# Patient Record
Sex: Female | Born: 1945
Health system: Southern US, Community
[De-identification: ages and names within clinical notes are randomized; demographics above are authoritative.]

## PROBLEM LIST (undated history)

## (undated) DIAGNOSIS — E785 Hyperlipidemia, unspecified: Secondary | ICD-10-CM

## (undated) DIAGNOSIS — Z720 Tobacco use: Secondary | ICD-10-CM

## (undated) DIAGNOSIS — I10 Essential (primary) hypertension: Secondary | ICD-10-CM

## (undated) DIAGNOSIS — T50B95A Adverse effect of other viral vaccines, initial encounter: Secondary | ICD-10-CM

## (undated) DIAGNOSIS — Z972 Presence of dental prosthetic device (complete) (partial): Secondary | ICD-10-CM

## (undated) DIAGNOSIS — K08109 Complete loss of teeth, unspecified cause, unspecified class: Secondary | ICD-10-CM

## (undated) DIAGNOSIS — M79604 Pain in right leg: Secondary | ICD-10-CM

## (undated) DIAGNOSIS — Z9289 Personal history of other medical treatment: Secondary | ICD-10-CM

## (undated) DIAGNOSIS — M79605 Pain in left leg: Secondary | ICD-10-CM

## (undated) DIAGNOSIS — R7303 Prediabetes: Secondary | ICD-10-CM

## (undated) DIAGNOSIS — Z973 Presence of spectacles and contact lenses: Secondary | ICD-10-CM

## (undated) DIAGNOSIS — R319 Hematuria, unspecified: Secondary | ICD-10-CM

## (undated) DIAGNOSIS — R7301 Impaired fasting glucose: Secondary | ICD-10-CM

## (undated) HISTORY — DX: Impaired fasting glucose: R73.01

## (undated) HISTORY — DX: Hematuria, unspecified: R31.9

## (undated) HISTORY — DX: Hyperlipidemia, unspecified: E78.5

## (undated) HISTORY — DX: Pain in right leg: M79.604

## (undated) HISTORY — DX: Presence of dental prosthetic device (complete) (partial): Z97.2

## (undated) HISTORY — DX: Presence of spectacles and contact lenses: Z97.3

## (undated) HISTORY — PX: TONSILLECTOMY: SUR1361

## (undated) HISTORY — DX: Tobacco use: Z72.0

## (undated) HISTORY — DX: Complete loss of teeth, unspecified cause, unspecified class: K08.109

## (undated) HISTORY — DX: Pain in left leg: M79.605

## (undated) HISTORY — DX: Essential (primary) hypertension: I10

## (undated) HISTORY — PX: SPINE SURGERY: SHX786

## (undated) HISTORY — DX: Adverse effect of other viral vaccines, initial encounter: T50.B95A

## (undated) HISTORY — DX: Personal history of other medical treatment: Z92.89

## (undated) HISTORY — PX: SHOULDER SURGERY: SHX246

## (undated) HISTORY — PX: KNEE SURGERY: SHX244

## (undated) HISTORY — PX: TUBAL LIGATION: SHX77

## (undated) HISTORY — DX: Presence of dental prosthetic device (complete) (partial): K08.109

---

## 1978-09-09 HISTORY — PX: ABDOMINAL HYSTERECTOMY: SHX81

## 1978-09-09 HISTORY — PX: APPENDECTOMY: SHX54

## 1998-03-20 ENCOUNTER — Emergency Department (HOSPITAL_COMMUNITY): Admission: EM | Admit: 1998-03-20 | Discharge: 1998-03-20 | Payer: Self-pay | Admitting: Emergency Medicine

## 1999-11-27 ENCOUNTER — Ambulatory Visit (HOSPITAL_COMMUNITY): Admission: RE | Admit: 1999-11-27 | Discharge: 1999-11-27 | Payer: Self-pay | Admitting: Orthopaedic Surgery

## 2000-01-02 ENCOUNTER — Ambulatory Visit (HOSPITAL_COMMUNITY): Admission: RE | Admit: 2000-01-02 | Discharge: 2000-01-02 | Payer: Self-pay | Admitting: Orthopaedic Surgery

## 2000-02-07 ENCOUNTER — Ambulatory Visit (HOSPITAL_COMMUNITY): Admission: RE | Admit: 2000-02-07 | Discharge: 2000-02-07 | Payer: Self-pay | Admitting: Orthopaedic Surgery

## 2000-06-05 ENCOUNTER — Encounter: Admission: RE | Admit: 2000-06-05 | Discharge: 2000-06-05 | Payer: Self-pay | Admitting: Family Medicine

## 2000-06-05 ENCOUNTER — Encounter: Payer: Self-pay | Admitting: Family Medicine

## 2001-06-08 ENCOUNTER — Encounter: Admission: RE | Admit: 2001-06-08 | Discharge: 2001-06-08 | Payer: Self-pay | Admitting: Family Medicine

## 2001-06-08 ENCOUNTER — Encounter: Payer: Self-pay | Admitting: Family Medicine

## 2002-06-21 ENCOUNTER — Encounter: Payer: Self-pay | Admitting: Family Medicine

## 2002-06-21 ENCOUNTER — Encounter: Admission: RE | Admit: 2002-06-21 | Discharge: 2002-06-21 | Payer: Self-pay | Admitting: Family Medicine

## 2003-06-23 ENCOUNTER — Encounter: Payer: Self-pay | Admitting: Family Medicine

## 2003-06-23 ENCOUNTER — Encounter: Admission: RE | Admit: 2003-06-23 | Discharge: 2003-06-23 | Payer: Self-pay | Admitting: Family Medicine

## 2003-10-03 ENCOUNTER — Inpatient Hospital Stay (HOSPITAL_COMMUNITY): Admission: EM | Admit: 2003-10-03 | Discharge: 2003-10-05 | Payer: Self-pay | Admitting: Emergency Medicine

## 2003-10-04 ENCOUNTER — Encounter (INDEPENDENT_AMBULATORY_CARE_PROVIDER_SITE_OTHER): Payer: Self-pay | Admitting: Cardiology

## 2004-06-26 ENCOUNTER — Ambulatory Visit (HOSPITAL_COMMUNITY): Admission: RE | Admit: 2004-06-26 | Discharge: 2004-06-26 | Payer: Self-pay | Admitting: Family Medicine

## 2005-07-12 ENCOUNTER — Encounter: Admission: RE | Admit: 2005-07-12 | Discharge: 2005-07-12 | Payer: Self-pay | Admitting: Family Medicine

## 2006-07-07 ENCOUNTER — Ambulatory Visit: Payer: Self-pay | Admitting: Family Medicine

## 2006-07-24 ENCOUNTER — Encounter: Admission: RE | Admit: 2006-07-24 | Discharge: 2006-07-24 | Payer: Self-pay | Admitting: Family Medicine

## 2006-07-24 LAB — HM MAMMOGRAPHY: HM Mammogram: NEGATIVE

## 2006-09-09 DIAGNOSIS — R7301 Impaired fasting glucose: Secondary | ICD-10-CM

## 2006-09-09 HISTORY — DX: Impaired fasting glucose: R73.01

## 2006-11-11 ENCOUNTER — Ambulatory Visit: Payer: Self-pay | Admitting: Family Medicine

## 2006-12-22 ENCOUNTER — Ambulatory Visit: Payer: Self-pay | Admitting: Family Medicine

## 2006-12-23 ENCOUNTER — Ambulatory Visit (HOSPITAL_COMMUNITY): Admission: RE | Admit: 2006-12-23 | Discharge: 2006-12-23 | Payer: Self-pay | Admitting: Family Medicine

## 2006-12-30 ENCOUNTER — Ambulatory Visit: Payer: Self-pay | Admitting: Family Medicine

## 2006-12-30 ENCOUNTER — Ambulatory Visit (HOSPITAL_COMMUNITY): Admission: RE | Admit: 2006-12-30 | Discharge: 2006-12-30 | Payer: Self-pay | Admitting: Family Medicine

## 2007-01-07 ENCOUNTER — Ambulatory Visit (HOSPITAL_COMMUNITY): Admission: RE | Admit: 2007-01-07 | Discharge: 2007-01-07 | Payer: Self-pay | Admitting: Family Medicine

## 2007-07-07 ENCOUNTER — Ambulatory Visit: Payer: Self-pay | Admitting: Family Medicine

## 2007-07-27 ENCOUNTER — Encounter: Admission: RE | Admit: 2007-07-27 | Discharge: 2007-07-27 | Payer: Self-pay | Admitting: Family Medicine

## 2007-10-26 ENCOUNTER — Ambulatory Visit: Payer: Self-pay | Admitting: Family Medicine

## 2008-01-11 ENCOUNTER — Ambulatory Visit: Payer: Self-pay | Admitting: Family Medicine

## 2008-04-06 ENCOUNTER — Encounter: Admission: RE | Admit: 2008-04-06 | Discharge: 2008-04-06 | Payer: Self-pay | Admitting: Family Medicine

## 2008-04-06 ENCOUNTER — Ambulatory Visit: Payer: Self-pay | Admitting: Family Medicine

## 2008-09-09 DIAGNOSIS — Z9289 Personal history of other medical treatment: Secondary | ICD-10-CM

## 2008-09-09 HISTORY — DX: Personal history of other medical treatment: Z92.89

## 2011-01-25 NOTE — Discharge Summary (Signed)
NAME:  Alyssa Carter, Alyssa Carter                       ACCOUNT NO.:  [id number]   MEDICAL RECORD NO.:  [id number]                   PATIENT TYPE:  INP   LOCATION:  3001                                 FACILITY:  [facility]   PHYSICIAN:  Hettie Holstein, D.O.                 DATE OF BIRTH:  1946/07/20   DATE OF ADMISSION:  10/03/2003  DATE OF DISCHARGE:  10/05/2003                                 DISCHARGE SUMMARY   PHYSICIANS:  1. Sharlot Gowda, M.D., primary care physician  2. Pramod P. Pearlean Brownie, M.D., neurologist.   ADMISSION DIAGNOSIS:  Transient ischemic attack.   DISCHARGE DIAGNOSES:  1. Subacute cerebrovascular accident.  2. Status post consultation by throat service here at the hospital with the     patient suspected of having cerebrovascular accident secondary to     microvascular disease and hyperlipidemia.  She was noted to have a right     external capsular infarct with normal carotid dopplers as well as normal     echocardiogram revealing an EF of 55-65%.  She underwent ABIs that did     reveal left ABI indicative of moderate to severe reduction of flow,     though this was an improvement since January of 2005.  The right was     within normal limits.  She underwent MRI that did reveal acute right     hemispheric infarction involving the external capsule on the right with a     second tiny punctate focus with acute infarction in the centrum     semiovale, paraventricular region.  MRA was performed without evidence of     significant carotid extracranial atherosclerotic disease; however, there     was 75% to 90% stenosis at the mid to distal left vertebral artery.   HISTORY OF PRESENT ILLNESS:  Please see the H&P.  Briefly, this is a 65-year-  old Caucasian right-handed female with a history of chronic back pain who  repeated three occasions of left arm incoordination and numbness.  She had  difficulty picking up the phone and silverware.  She has also complained of  some left leg  weakness as well.   She saw her primary care physician on Monday morning and subsequently  presented to the ED by that time her symptoms had completely resolved.   HOSPITAL COURSE:  She underwent MRI evaluation with findings as noted above.  She was evaluated by the neurology/stroke service here and evaluation  concluded with acute CVA secondary to microvascular disease with  recommendations for Lipitor.  Her homocystine levels were within normal  limits.  She was encouraged to enroll in the smoking cessation.  She is  scheduled for follow up with Dr. Sharlot Gowda as well as Dr. Sunny Schlein. Sethi  of neurology in two months.   DISCHARGE MEDICATIONS:  1. Aspirin 325 mg p.o. q.d.  2. Lipitor 4 mg p.o. q.d.  3. She is instructed  to continue her multivitamin and calcium.                                                Hettie Holstein, D.O.    ESS/MEDQ  D:  11/07/2003  T:  11/08/2003  Job:  [id number]

## 2011-01-25 NOTE — Consult Note (Signed)
NAME:  Alyssa Carter, Alyssa Carter                       ACCOUNT NO.:  [id number]   MEDICAL RECORD NO.:  [id number]                   PATIENT TYPE:  INP   LOCATION:  3001                                 FACILITY:  [facility]   PHYSICIAN:  Melvyn Novas, M.D.               DATE OF BIRTH:  19-Dec-1945   DATE OF CONSULTATION:  DATE OF DISCHARGE:                                   CONSULTATION   Alyssa Carter is today admitted to the hospitalist service under Dr. Foye Clock name on October 03, 2003, from the [facility] ER.  She is a 65-year-  old Caucasian right-handed female with a medical record of [id number].  Date of  birth is Jul 17, 1946.  The patient is a appropriate, pleasant 65-year-  old right-handed female who had 4 stereotypical spells that occurred at  various times on Saturday and Sunday.  She stated that on Saturday on 3  occasions her left arm became sluggish, numb, and did not seem to be able to  perform fine motor skills.  She was subsequently not able to pick up her  telephone receiver or silverware.  In addition, she states that her left leg  gave out on her and said she had to sit in the recliner and wait until this  spell passed.  This occurred Saturday, again 3 times.  On Sunday was the 4th  spell equivalent with left arm and leg weakness and clumsiness.  She  presented on Monday morning to Dr. Susann Givens, her primary care physician, who  then sent her to the ER.  The patient had no symptoms here and none since  yesterday's spell.   MEDICAL HISTORY:  None except for chronic back pain.   MEDICINES:  She takes Tums, multivitamins.  She is not on aspirin or any  prescription drugs.  She has no known drug allergies.   SOCIAL HISTORY:  She is a smoker, 1-1/2 packs per day for over 30 years.  No  alcohol.  She says she lives an active life, is gainfully employed.   FAMILY HISTORY:  Positive for hypertension.   PHYSICAL EXAMINATION:  VITAL SIGNS:  Blood pressure 145/68, heart rate  68  and regular, temperature 97.8, respiratory rate 16 and regular.  LUNGS:  Clear to auscultation.  HEENT, NECK:  No neck vein distention.  No goiter.  No edema.  No tongue  bite.  MENTAL STATUS:  Alert and oriented.  No arthralgia, apraxia, or dysarthria.  Cranial nerves:  Pupils are equal to light and accommodation.  Full  extraocular movements without nystagmus or diplopia.  Full visual fields  bilaterally with bilateral simultaneous stimulation.  No papilledema.  Facial symmetry.  Tongue and uvula are midline.  Motor exam with 5/5  strength.  Tone and _________ bilaterally equal.  Grip strength finger-to-  nose intact.  Vision intact.  Normal gait, stance, with negative Romberg and  normal arm strength.  No  evidence of imbalance.   CT is pending.   The patient is admitted to Dr. Elliot Cousin, hospitalist service.  We will  follow on the stroke team for possible _____________ TIA, stroke workup.  The patient denies having a history of atrial fibrillation or any known  cardiac disease or any other risk factors for atherosclerosis, cardiac valve  disease, and denies drug abuse.                                              Melvyn Novas, M.D.   CD/MEDQ  D:  10/03/2003  T:  10/04/2003  Job:  [id number]

## 2011-01-25 NOTE — H&P (Signed)
NAME:  Alyssa Carter, Alyssa Carter                       ACCOUNT NO.:  [id number]   MEDICAL RECORD NO.:  [id number]                   PATIENT TYPE:  INP   LOCATION:  1825                                 FACILITY:  [facility]   PHYSICIAN:  Vania Rea, M.D.              DATE OF BIRTH:  1946-04-21   DATE OF ADMISSION:  10/03/2003  DATE OF DISCHARGE:                                HISTORY & PHYSICAL   PRIMARY CARE PHYSICIAN:  Sharlot Gowda, M.D.   CHIEF COMPLAINT:  Recurrent left sided weakness for two days.   HISTORY OF PRESENT ILLNESS:  This is a 65 year old Caucasian female with a  history of hyperlipidemia, noncompliance with her Lipitor for the past one  year who noticed weakness and numbness of her left upper extremity two days  ago when she was sitting and attempted to reach to pick up something.  She  then tried to stand and noticed weakness and numbness of her left lower  extremity.  The problem resolved within one minute, but then recurred twice  more at 15 minute intervals, each time lasting for one minute.  She was well  until the following day when she had another recurrence lasting one minute.  She went to her primary care physician, Dr. Susann Givens this morning and he sent  her in for admission.  The patient denies headaches, nausea, vomiting.  ____________.  She is a fitness person.  She walks two miles everyday  without any difficulty.  She has no fevers, cough or cold.  She denies  headaches; only started in the emergency room when she starts to get anxious  she develops a band-like headache around her head.   PAST MEDICAL HISTORY:  Hyperlipidemia.  No diabetes.  No hypertension.   MEDICAL MEDICATIONS:  Lipitor __________ x1 year.   SOCIAL HISTORY:  No tobacco, alcohol or drugs.  She was complete with her  divorce one week ago.  She lost her father to a stroke status post a fall  six months ago.  Her father was 30 years old.  He also had a hint of senile  dementia.  She has two  siblings, her brother and sister.  The sister has  hyperlipidemia, otherwise health.  She has two children, son and daughter,  and they are both healthy.   REVIEW OF SYSTEMS:  Review of systems is completely negative.   PHYSICAL EXAMINATION:  GENERAL:  This is an anxious-looking, middle-aged,  Caucasian female lying on the stretcher in no respiratory or painful  distress.  VITAL SIGNS:  When her first seen her vitals were a temperature of 98.3,  pulse 100, respirations 22, blood pressure 160/92, saturating 99% on room  air.  Later on she settled down with a temperature of  97.8, blood pressure  142/74, pulse 82, respirations 18, saturating at 98%.  HEENT:  She is pink and anicteric.  No lymphadenopathy.  No dehydration.  There is no jugular  venous distention.  There is no evidence of trauma.  CHEST:  Clear to auscultation bilaterally.  CARDIOVASCULAR:  Regular rhythm.  No murmur.  ABDOMEN:  Obese, soft, nontender.  EXTREMITIES:  No edema.  Pulses are 2+.  CNS:  Her cranial nerves are intact.  Her reflexes are equal and normal  throughout.  Sensation is normal throughout.  The power is grade 5  throughout.   Her CT scan of the head shows 3 to 6 mm internal capsule hyperdense probably  ischemic infarct.  Her labs including CBC, chem-7, LFTs and urinalysis are  completely unremarkable.  No abnormalities observed.  Chest x-ray and EKG  are pending.   ASSESSMENT:  Lady presenting with symptoms of TIA and a CT finding of what  appears to be a lacunar infarct.   PLAN:  1. I have discussed with neurology.  2. Admit.  Start on aspirin.  Neurological checks.  3. Further management as per the neurology service.   Discussed with neurology, they will be coming to see the patient tonight.                                                Vania Rea, M.D.    LC/MEDQ  D:  10/03/2003  T:  10/04/2003  Job:  [id number]

## 2011-12-03 ENCOUNTER — Ambulatory Visit (INDEPENDENT_AMBULATORY_CARE_PROVIDER_SITE_OTHER): Payer: Self-pay | Admitting: Medical

## 2011-12-03 ENCOUNTER — Encounter: Payer: Self-pay | Admitting: Medical

## 2011-12-03 DIAGNOSIS — H68009 Unspecified Eustachian salpingitis, unspecified ear: Secondary | ICD-10-CM

## 2011-12-03 DIAGNOSIS — R030 Elevated blood-pressure reading, without diagnosis of hypertension: Secondary | ICD-10-CM

## 2011-12-03 NOTE — Patient Instructions (Addendum)
Currently you do NOT have an ear infection, and there is no wax in the ear canal.  It would appear that you have fluid behind the ear drums and in the eustachian tubes.     I recommend you begin OTC Zyrtec nightly and plain Mucinex twice daily for the next 5-7 days.     If worse pain, fever, ear ache, etc, call back and we will call out an antibiotic.    Barotitis Media Barotitis media is soreness (inflammation) of the area behind the eardrum (middle ear). This occurs when the auditory tube (Eustachian tube) leading from the back of the throat to the eardrum is blocked. When it is blocked air cannot move in and out of the middle ear to equalize pressure changes. These pressure changes come from changes in altitude when:  Flying.   Driving in the mountains.   Diving.  Problems are more likely to occur with pressure changes during times when you are congested as from:  Hay fever.   Upper respiratory infection.   A cold.  Damage or hearing loss (barotrauma) caused by this may be permanent. HOME CARE INSTRUCTIONS   Use medicines as recommended by your caregiver. Over the counter medicines will help unblock the canal and can help during times of air travel.   Do not put anything into your ears to clean or unplug them. Eardrops will not be helpful.   Do not swim, dive, or fly until your caregiver says it is all right to do so. If these activities are necessary, chewing gum with frequent swallowing may help. It is also helpful to hold your nose and gently blow to pop your ears for equalizing pressure changes. This forces air into the Eustachian tube.   For little ones with problems, give your baby a bottle of water or juice during periods when pressure changes would be anticipated such as during take offs and landings associated with air travel.   Only take over-the-counter or prescription medicines for pain, discomfort, or fever as directed by your caregiver.   A decongestant may be  helpful in de-congesting the middle ear and make pressure equalization easier. This can be even more effective if the drops (spray) are delivered with the head lying over the edge of a bed with the head tilted toward the ear on the affected side.   If your caregiver has given you a follow-up appointment, it is very important to keep that appointment. Not keeping the appointment could result in a chronic or permanent injury, pain, hearing loss and disability. If there is any problem keeping the appointment, you must call back to this facility for assistance.  SEEK IMMEDIATE MEDICAL CARE IF:   You develop a severe headache, dizziness, severe ear pain, or bloody or pus-like drainage from your ears.   An oral temperature above 102 F (38.9 C) develops.   Your problems do not improve or become worse.  MAKE SURE YOU:   Understand these instructions.   Will watch your condition.   Will get help right away if you are not doing well or get worse.  Document Released: 08/23/2000 Document Revised: 05/08/2011 Document Reviewed: 03/31/2008 [facility] Patient Information 2012 [place], [place].

## 2011-12-03 NOTE — Progress Notes (Signed)
Subjective:  Alyssa Carter is a 66 y.o. female who presents with right ear for a few weeks.  Last visit here 2009.   Gets occasional pain.  Now feels like she is talking in a barrel with crickets.  Denies fever, sinus pressure, doesn't feel sick.  Hearing feels off on the right.  No headaches.  No numbness or tingling, no speech changes.    Using some ear drops for wax and this made things worse.  She is a smoker.  No other aggravating or relieving factors.  No other c/o.  ROS Gen: no fever, chills HEENT: no ST, runny nose, sneezing GI: negative Lungs: no SOB, wheezing   Objective:   Filed Vitals:   12/03/11 1349  BP: 150/90  Pulse: 72  Temp: 98.2 F (36.8 C)  Resp: 16    General appearance: Alert, WD/WN, no distress                             Skin: warm, no rash                           Head: no sinus tenderness                            Eyes: conjunctiva normal, corneas clear, PERRLA                            Ears: left TM with mild erythema, flat, right TM with serous fluid behind TM, no erythema, external ear canals normal                          Nose: septum midline, turbinates swollen, with erythema and clear discharge             Mouth/throat: MMM, tongue normal, mild pharyngeal erythema                           Neck: supple, no adenopathy, no thyromegaly, nontender                          Heart: RRR, normal S1, S2, no murmurs                         Lungs: CTA bilaterally, no wheezes, rales, or rhonchi     Assessment and Plan:   Encounter Diagnoses  Name Primary?  . Eustachian salpingitis Yes  . Elevated blood pressure reading without diagnosis of hypertension    Discussed etiology, usual course of symptoms.  Advised she begin OTC Zyrtec and Mucinex, increase water intake.  If worse or not improving in next 3-5 days, consider Amoxicillin.    Advised she return soon for physical, recheck on elevated BP.

## 2012-09-28 ENCOUNTER — Ambulatory Visit (INDEPENDENT_AMBULATORY_CARE_PROVIDER_SITE_OTHER): Payer: Self-pay | Admitting: Medical

## 2012-09-28 ENCOUNTER — Encounter: Payer: Self-pay | Admitting: Medical

## 2012-09-28 VITALS — BP 160/80 | HR 82 | Temp 98.3°F | Resp 16 | Wt 173.0 lb

## 2012-09-28 DIAGNOSIS — S39012A Strain of muscle, fascia and tendon of lower back, initial encounter: Secondary | ICD-10-CM

## 2012-09-28 DIAGNOSIS — M62830 Muscle spasm of back: Secondary | ICD-10-CM

## 2012-09-28 DIAGNOSIS — IMO0002 Reserved for concepts with insufficient information to code with codable children: Secondary | ICD-10-CM

## 2012-09-28 DIAGNOSIS — M5380 Other specified dorsopathies, site unspecified: Secondary | ICD-10-CM

## 2012-09-28 DIAGNOSIS — I10 Essential (primary) hypertension: Secondary | ICD-10-CM

## 2012-09-28 MED ORDER — IBUPROFEN 800 MG PO TABS: 800.0000 mg | ORAL_TABLET | Freq: Three times a day (TID) | ORAL | Status: DC | PRN

## 2012-09-28 MED ORDER — CYCLOBENZAPRINE HCL 10 MG PO TABS: ORAL_TABLET | ORAL | Status: DC

## 2012-09-28 MED ORDER — LISINOPRIL 10 MG PO TABS: 10.0000 mg | ORAL_TABLET | Freq: Every day | ORAL | Status: DC

## 2012-09-28 NOTE — Progress Notes (Signed)
  Subjective:    Alyssa Carter is a 67 y.o. female who presents for evaluation of back pain. The patient has a hx/o lumbar surgery 30 years ago, but has done relatively well for years.  She notes she was at work 4 days ago, was helping move her office desk to rearrange the office area, and when she had squatted down with her legs and went to lift up, felt immediate pull and pain sensation in her back.  After sitting to rest for a while, she tried to get back to work, vacuuming and cleaning, but continued to have back pain and spasm.  Used some Ibuprofen a few times without relief.  Thus she is here today for ongoing back pain.  She did report this to her supervisor, and did get approval to be evaluated today.  The pain is in the low back, worse with standing, bending, getting up from sitting or lying position.    The following portions of the patient's history were reviewed and updated as appropriate: allergies, current medications, past family history, past medical history, past social history, past surgical history and problem list.  Review of Systems Constitutional: denies fever, chills, sweats, unexpected weight change, anorexia, fatigue Dermatology: rash, bruising, redness Cardiology: denies chest pain, palpitations, edema, orthopnea, paroxysmal nocturnal dyspnea Respiratory: denies cough, shortness of breath, dyspnea on exertion, wheezing, hemoptysis Gastroenterology: denies abdominal pain, nausea, vomiting, diarrhea, constipation, blood in stool, changes in bowel movement, dysphagia Musculoskeletal: denies arthralgias, joint swelling, neck pain, cramping Urology: denies dysuria, difficulty urinating, hematuria, urinary frequency, urgency, incontinence Neurology: no headache, weakness, tingling, numbness, speech abnormality, memory loss, falls, dizziness      Objective:    Filed Vitals:   09/28/12 1422  BP: 160/80  Pulse: 82  Temp: 98.3 F (36.8 C)  Resp: 16    General  appearance: alert, no distress, WD/WN, female Neck: supple, no lymphadenopathy, no thyromegaly, no masses, normal ROM Chest: non tender, normal shape and expansion Heart: RRR, normal S1, S2, no murmurs Lungs: CTA bilaterally, no wheezes, rhonchi, or rales Abdomen: +bs, soft, non tender, non distended, no masses, no hepatomegaly, no splenomegaly, no bruits Back: tender left lumbar paraspinal region, +spasm, pain with back extension and standing from seated position, otherwise ROM relatively normal Extremities: no edema, no cyanosis, no clubbing Pulses: 2+ symmetric, upper and lower extremities Neuro: -SLR, normal heel and toe walk, DTRs 2+, LE sensation WNL     Assessment:    Encounter Diagnoses  Name Primary?  . Back strain Yes  . Muscle spasm of back   . Essential hypertension, benign       Plan:    Back strain, muscle spasm - discussed diagnosis and treatment.   Advised rest, no heavy lifting, no lifting over 10 lbs for the next 3 days, can use heat, begin scripts for Ibuprofen, Flexeril prn, and symptoms should gradually improve over the next week.  Gave note for work.   If worse or not improving, recheck.    HTN - new diagnosis.  I reviewed prior blood pressure readings dating back from 2008.  She has had elevated readings since then.   Given these readings and today's readings, we will begin medication for new diagnosis of hypertension.  Discussed risks of untreated hypertension.  Discussed medication risks/benefits.   Begin Lisinopril 10mg  daily, f/u in 1 month for recheck, labs.

## 2012-09-28 NOTE — Patient Instructions (Signed)
Diagnosis today: Back strain, muscle spasm  Recommendations:  Rest, no heavy lifting, no lifting over 10 lbs for the next 3-5 days.  Begin Flexeril muscle relaxer, 1/2 - 1 tablet for spasm.  Use this either at bedtime or up to every 8 hours if you are at home.  This can cause drowsiness  Begin Ibuprofen 800mg  for pain/inflammation.  Use this every 8 hours for the next few days, then just use as needed.    Consider heat, massage.  If not much improved in 3- 5 days, then return  Additional information is provided below.  Please call if you have questions or concerns. Low Back Strain with Rehab A strain is an injury in which a tendon or muscle is torn. The muscles and tendons of the lower back are vulnerable to strains. However, these muscles and tendons are very strong and require a great force to be injured. Strains are classified into three categories. Grade 1 strains cause pain, but the tendon is not lengthened. Grade 2 strains include a lengthened ligament, due to the ligament being stretched or partially ruptured. With grade 2 strains there is still function, although the function may be decreased. Grade 3 strains involve a complete tear of the tendon or muscle, and function is usually impaired. SYMPTOMS   Pain in the lower back.   Pain that affects one side more than the other.   Pain that gets worse with movement and may be felt in the hip, buttocks, or back of the thigh.   Muscle spasms of the muscles in the back.   Swelling along the muscles of the back.   Loss of strength of the back muscles.   Crackling sound (crepitation) when the muscles are touched.  CAUSES  Lower back strains occur when a force is placed on the muscles or tendons that is greater than they can handle. Common causes of injury include:  Prolonged overuse of the muscle-tendon units in the lower back, usually from incorrect posture.   A single violent injury or force applied to the back.  RISK  INCREASES WITH:  Sports that involve twisting forces on the spine or a lot of bending at the waist (football, rugby, weightlifting, bowling, golf, tennis, speed skating, racquetball, swimming, running, gymnastics, diving).   Poor strength and flexibility.   Failure to warm up properly before activity.   Family history of lower back pain or disk disorders.   Previous back injury or surgery (especially fusion).   Poor posture with lifting, especially heavy objects.   Prolonged sitting, especially with poor posture.  PREVENTION   Learn and use proper posture when sitting or lifting (maintain proper posture when sitting, lift using the knees and legs, not at the waist).   Warm up and stretch properly before activity.   Allow for adequate recovery between workouts.   Maintain physical fitness:   Strength, flexibility, and endurance.   Cardiovascular fitness.  PROGNOSIS  If treated properly, lower back strains usually heal within 6 weeks. RELATED COMPLICATIONS   Recurring symptoms, resulting in a chronic problem.   Chronic inflammation, scarring, and partial muscle-tendon tear.   Delayed healing or resolution of symptoms.   Prolonged disability.  TREATMENT  Treatment first involves the use of ice and medicine, to reduce pain and inflammation. The use of strengthening and stretching exercises may help reduce pain with activity. These exercises may be performed at home or with a therapist. Severe injuries may require referral to a therapist for further evaluation and  treatment, such as ultrasound. Your caregiver may advise that you wear a back brace or corset, to help reduce pain and discomfort. Often, prolonged bed rest results in greater harm then benefit. Corticosteroid injections may be recommended. However, these should be reserved for the most serious cases. It is important to avoid using your back when lifting objects. At night, sleep on your back on a firm mattress with a  pillow placed under your knees. If non-surgical treatment is unsuccessful, surgery may be needed.  MEDICATION   If pain medicine is needed, nonsteroidal anti-inflammatory medicines (aspirin and ibuprofen), or other minor pain relievers (acetaminophen), are often advised.   Do not take pain medicine for 7 days before surgery.   Prescription pain relievers may be given, if your caregiver thinks they are needed. Use only as directed and only as much as you need.   Ointments applied to the skin may be helpful.   Corticosteroid injections may be given by your caregiver. These injections should be reserved for the most serious cases, because they may only be given a certain number of times.  HEAT AND COLD  Cold treatment (icing) should be applied for 10 to 15 minutes every 2 to 3 hours for inflammation and pain, and immediately after activity that aggravates your symptoms. Use ice packs or an ice massage.   Heat treatment may be used before performing stretching and strengthening activities prescribed by your caregiver, physical therapist, or athletic trainer. Use a heat pack or a warm water soak.  SEEK MEDICAL CARE IF:   Symptoms get worse or do not improve in 2 to 4 weeks, despite treatment.   You develop numbness, weakness, or loss of bowel or bladder function.   New, unexplained symptoms develop. (Drugs used in treatment may produce side effects.)  EXERCISES  RANGE OF MOTION (ROM) AND STRETCHING EXERCISES - Low Back Strain Most people with lower back pain will find that their symptoms get worse with excessive bending forward (flexion) or arching at the lower back (extension). The exercises which will help resolve your symptoms will focus on the opposite motion.  Your physician, physical therapist or athletic trainer will help you determine which exercises will be most helpful to resolve your lower back pain. Do not complete any exercises without first consulting with your caregiver.  Discontinue any exercises which make your symptoms worse until you speak to your caregiver.  If you have pain, numbness or tingling which travels down into your buttocks, leg or foot, the goal of the therapy is for these symptoms to move closer to your back and eventually resolve. Sometimes, these leg symptoms will get better, but your lower back pain may worsen. This is typically an indication of progress in your rehabilitation. Be very alert to any changes in your symptoms and the activities in which you participated in the 24 hours prior to the change. Sharing this information with your caregiver will allow him/her to most efficiently treat your condition.  These exercises may help you when beginning to rehabilitate your injury. Your symptoms may resolve with or without further involvement from your physician, physical therapist or athletic trainer. While completing these exercises, remember:   Restoring tissue flexibility helps normal motion to return to the joints. This allows healthier, less painful movement and activity.   An effective stretch should be held for at least 30 seconds.   A stretch should never be painful. You should only feel a gentle lengthening or release in the stretched tissue.  FLEXION RANGE  OF MOTION AND STRETCHING EXERCISES: STRETCH - Flexion, Single Knee to Chest   Lie on a firm bed or floor with both legs extended in front of you.   Keeping one leg in contact with the floor, bring your opposite knee to your chest. Hold your leg in place by either grabbing behind your thigh or at your knee.   Pull until you feel a gentle stretch in your lower back. Hold 20 seconds.   Slowly release your grasp and repeat the exercise with the opposite side.  Repeat 3 times. Complete this exercise 1-2 times per day.  STRETCH - Flexion, Double Knee to Chest   Lie on a firm bed or floor with both legs extended in front of you.   Keeping one leg in contact with the floor, bring your  opposite knee to your chest.   Tense your stomach muscles to support your back and then lift your other knee to your chest. Hold your legs in place by either grabbing behind your thighs or at your knees.   Pull both knees toward your chest until you feel a gentle stretch in your lower back. Hold 20 seconds.   Tense your stomach muscles and slowly return one leg at a time to the floor.  Repeat 3 times. Complete this exercise 1-2 times per day.  STRETCH - Low Trunk Rotation  Lie on a firm bed or floor. Keeping your legs in front of you, bend your knees so they are both pointed toward the ceiling and your feet are flat on the floor.   Extend your arms out to the side. This will stabilize your upper body by keeping your shoulders in contact with the floor.   Gently and slowly drop both knees together to one side until you feel a gentle stretch in your lower back. Hold for 20 seconds.   Tense your stomach muscles to support your lower back as you bring your knees back to the starting position. Repeat the exercise to the other side.  Repeat 3 times. Complete this exercise 1-2 times per day  EXTENSION RANGE OF MOTION AND FLEXIBILITY EXERCISES: STRETCH - Extension, Prone on Elbows   Lie on your stomach on the floor, a bed will be too soft. Place your palms about shoulder width apart and at the height of your head.   Place your elbows under your shoulders. If this is too painful, stack pillows under your chest.   Allow your body to relax so that your hips drop lower and make contact more completely with the floor.   Hold this position for 20 seconds.   Slowly return to lying flat on the floor.  Repeat 3 times. Complete this exercise 1-2 times per day.  RANGE OF MOTION - Extension, Prone Press Ups  Lie on your stomach on the floor, a bed will be too soft. Place your palms about shoulder width apart and at the height of your head.   Keeping your back as relaxed as possible, slowly straighten  your elbows while keeping your hips on the floor. You may adjust the placement of your hands to maximize your comfort. As you gain motion, your hands will come more underneath your shoulders.   Hold this position 20 seconds.   Slowly return to lying flat on the floor.  Repeat 3 times. Complete this exercise 1-2 times per day.  RANGE OF MOTION- Quadruped, Neutral Spine   Assume a hands and knees position on a firm surface. Keep your hands  under your shoulders and your knees under your hips. You may place padding under your knees for comfort.   Drop your head and point your tail bone toward the ground below you. This will round out your lower back like an angry cat. Hold this position for __________ seconds.   Slowly lift your head and release your tail bone so that your back sags into a large arch, like an old horse.   Hold this position for 20 seconds.   Repeat this until you feel limber in your lower back.   Now, find your "sweet spot." This will be the most comfortable position somewhere between the two previous positions. This is your neutral spine. Once you have found this position, tense your stomach muscles to support your lower back.   Hold this position for 20 seconds.  Repeat 3 times. Complete this exercise 1-2 times per day.  STRENGTHENING EXERCISES - Low Back Strain These exercises may help you when beginning to rehabilitate your injury. These exercises should be done near your "sweet spot." This is the neutral, low-back arch, somewhere between fully rounded and fully arched, that is your least painful position. When performed in this safe range of motion, these exercises can be used for people who have either a flexion or extension based injury. These exercises may resolve your symptoms with or without further involvement from your physician, physical therapist or athletic trainer. While completing these exercises, remember:   Muscles can gain both the endurance and the strength  needed for everyday activities through controlled exercises.   Complete these exercises as instructed by your physician, physical therapist or athletic trainer. Increase the resistance and repetitions only as guided.   You may experience muscle soreness or fatigue, but the pain or discomfort you are trying to eliminate should never worsen during these exercises. If this pain does worsen, stop and make certain you are following the directions exactly. If the pain is still present after adjustments, discontinue the exercise until you can discuss the trouble with your caregiver.  STRENGTHENING - Deep Abdominals, Pelvic Tilt  Lie on a firm bed or floor. Keeping your legs in front of you, bend your knees so they are both pointed toward the ceiling and your feet are flat on the floor.   Tense your lower abdominal muscles to press your lower back into the floor. This motion will rotate your pelvis so that your tail bone is scooping upwards rather than pointing at your feet or into the floor.   With a gentle tension and even breathing, hold this position for __________ seconds.  Repeat __________ times. Complete this exercise __________ times per day.  STRENGTHENING - Abdominals, Crunches   Lie on a firm bed or floor. Keeping your legs in front of you, bend your knees so they are both pointed toward the ceiling and your feet are flat on the floor. Cross your arms over your chest.   Slightly tip your chin down without bending your neck.   Tense your abdominals and slowly lift your trunk high enough to just clear your shoulder blades. Lifting higher can put excessive stress on the lower back and does not further strengthen your abdominal muscles.   Control your return to the starting position.  Repeat __________ times. Complete this exercise __________ times per day.  STRENGTHENING - Quadruped, Opposite UE/LE Lift   Assume a hands and knees position on a firm surface. Keep your hands under your  shoulders and your knees under your hips.  You may place padding under your knees for comfort.   Find your neutral spine and gently tense your abdominal muscles so that you can maintain this position. Your shoulders and hips should form a rectangle that is parallel with the floor and is not twisted.   Keeping your trunk steady, lift your right hand no higher than your shoulder and then your left leg no higher than your hip. Make sure you are not holding your breath. Hold this position 20 seconds.   Continuing to keep your abdominal muscles tense and your back steady, slowly return to your starting position. Repeat with the opposite arm and leg.  Repeat 3 times. Complete this exercise 1-2 times per day.  STRENGTHENING - Lower Abdominals, Double Knee Lift  Lie on a firm bed or floor. Keeping your legs in front of you, bend your knees so they are both pointed toward the ceiling and your feet are flat on the floor.   Tense your abdominal muscles to brace your lower back and slowly lift both of your knees until they come over your hips. Be certain not to hold your breath.   Hold 20 seconds. Using your abdominal muscles, return to the starting position in a slow and controlled manner.  Repeat 3 times. Complete this exercise 1-2 times per day.  POSTURE AND BODY MECHANICS CONSIDERATIONS - Low Back Strain Keeping correct posture when sitting, standing or completing your activities will reduce the stress put on different body tissues, allowing injured tissues a chance to heal and limiting painful experiences. The following are general guidelines for improved posture. Your physician or physical therapist will provide you with any instructions specific to your needs. While reading these guidelines, remember:  The exercises prescribed by your provider will help you have the flexibility and strength to maintain correct postures.   The correct posture provides the best environment for your joints to work. All  of your joints have less wear and tear when properly supported by a spine with good posture. This means you will experience a healthier, less painful body.   Correct posture must be practiced with all of your activities, especially prolonged sitting and standing. Correct posture is as important when doing repetitive low-stress activities (typing) as it is when doing a single heavy-load activity (lifting).  RESTING POSITIONS Consider which positions are most painful for you when choosing a resting position. If you have pain with flexion-based activities (sitting, bending, stooping, squatting), choose a position that allows you to rest in a less flexed posture. You would want to avoid curling into a fetal position on your side. If your pain worsens with extension-based activities (prolonged standing, working overhead), avoid resting in an extended position such as sleeping on your stomach. Most people will find more comfort when they rest with their spine in a more neutral position, neither too rounded nor too arched. Lying on a non-sagging bed on your side with a pillow between your knees, or on your back with a pillow under your knees will often provide some relief. Keep in mind, being in any one position for a prolonged period of time, no matter how correct your posture, can still lead to stiffness. PROPER SITTING POSTURE In order to minimize stress and discomfort on your spine, you must sit with correct posture. Sitting with good posture should be effortless for a healthy body. Returning to good posture is a gradual process. Many people can work toward this most comfortably by using various supports until they have the  flexibility and strength to maintain this posture on their own. When sitting with proper posture, your ears will fall over your shoulders and your shoulders will fall over your hips. You should use the back of the chair to support your upper back. Your lower back will be in a neutral  position, just slightly arched. You may place a small pillow or folded towel at the base of your lower back for support.  When working at a desk, create an environment that supports good, upright posture. Without extra support, muscles tire, which leads to excessive strain on joints and other tissues. Keep these recommendations in mind: CHAIR:  A chair should be able to slide under your desk when your back makes contact with the back of the chair. This allows you to work closely.   The chair's height should allow your eyes to be level with the upper part of your monitor and your hands to be slightly lower than your elbows.  BODY POSITION  Your feet should make contact with the floor. If this is not possible, use a foot rest.   Keep your ears over your shoulders. This will reduce stress on your neck and lower back.  INCORRECT SITTING POSTURES  If you are feeling tired and unable to assume a healthy sitting posture, do not slouch or slump. This puts excessive strain on your back tissues, causing more damage and pain. Healthier options include:  Using more support, like a lumbar pillow.   Switching tasks to something that requires you to be upright or walking.   Talking a brief walk.   Lying down to rest in a neutral-spine position.  PROLONGED STANDING WHILE SLIGHTLY LEANING FORWARD  When completing a task that requires you to lean forward while standing in one place for a long time, place either foot up on a stationary 2-4 inch high object to help maintain the best posture. When both feet are on the ground, the lower back tends to lose its slight inward curve. If this curve flattens (or becomes too large), then the back and your other joints will experience too much stress, tire more quickly, and can cause pain. CORRECT STANDING POSTURES Proper standing posture should be assumed with all daily activities, even if they only take a few moments, like when brushing your teeth. As in sitting, your  ears should fall over your shoulders and your shoulders should fall over your hips. You should keep a slight tension in your abdominal muscles to brace your spine. Your tailbone should point down to the ground, not behind your body, resulting in an over-extended swayback posture.  INCORRECT STANDING POSTURES  Common incorrect standing postures include a forward head, locked knees and/or an excessive swayback. WALKING Walk with an upright posture. Your ears, shoulders and hips should all line-up. PROLONGED ACTIVITY IN A FLEXED POSITION When completing a task that requires you to bend forward at your waist or lean over a low surface, try to find a way to stabilize 3 out of 4 of your limbs. You can place a hand or elbow on your thigh or rest a knee on the surface you are reaching across. This will provide you more stability so that your muscles do not fatigue as quickly. By keeping your knees relaxed, or slightly bent, you will also reduce stress across your lower back. CORRECT LIFTING TECHNIQUES DO :   Assume a wide stance. This will provide you more stability and the opportunity to get as close as possible to the  object which you are lifting.   Tense your abdominals to brace your spine. Bend at the knees and hips. Keeping your back locked in a neutral-spine position, lift using your leg muscles. Lift with your legs, keeping your back straight.   Test the weight of unknown objects before attempting to lift them.   Try to keep your elbows locked down at your sides in order get the best strength from your shoulders when carrying an object.   Always ask for help when lifting heavy or awkward objects.  INCORRECT LIFTING TECHNIQUES DO NOT:   Lock your knees when lifting, even if it is a small object.   Bend and twist. Pivot at your feet or move your feet when needing to change directions.   Assume that you can safely pick up even a paper clip without proper posture.  Document Released: 08/26/2005  Document Revised: 05/08/2011 Document Reviewed: 12/08/2008 [facility] Patient Information 2012 [place], [place].

## 2012-10-24 ENCOUNTER — Other Ambulatory Visit: Payer: Self-pay

## 2013-01-25 ENCOUNTER — Encounter: Payer: Self-pay | Admitting: Medical

## 2013-01-25 ENCOUNTER — Ambulatory Visit (INDEPENDENT_AMBULATORY_CARE_PROVIDER_SITE_OTHER): Payer: Self-pay | Admitting: Medical

## 2013-01-25 VITALS — BP 150/80 | HR 92 | Temp 98.1°F | Resp 16 | Wt 167.0 lb

## 2013-01-25 DIAGNOSIS — I10 Essential (primary) hypertension: Secondary | ICD-10-CM

## 2013-01-25 DIAGNOSIS — F17200 Nicotine dependence, unspecified, uncomplicated: Secondary | ICD-10-CM

## 2013-01-25 MED ORDER — LISINOPRIL 20 MG PO TABS: 20.0000 mg | ORAL_TABLET | Freq: Every day | ORAL | Status: DC

## 2013-01-25 NOTE — Progress Notes (Signed)
Subjective; Here for recheck on hypertension.  Was started on BP medication in January by me.   BPs 140/60-70 on a regular basis.   Checks BP with work Engineer, civil (consulting) every 2 wks.  She is walking, using steps.  Tries to use some diet discretion.  She is a .5ppd smoker.  Denies chest pain, dyspnea.  No vision changes, no change in urination.   Takes her BP mediation at bedtime.  No other new problems.    Past Medical History  Diagnosis Date  . Hypertension    ROS as in subjective  Objective: Filed Vitals:   01/25/13 1109  BP: 150/80  Pulse: 92  Temp: 98.1 F (36.7 C)  Resp: 16    General appearance: alert, no distress, WD/WN,  Neck: supple, no lymphadenopathy, no thyromegaly, no masses, no bruits Heart: RRR, normal S1, S2, no murmurs Lungs: CTA bilaterally, no wheezes, rhonchi, or rales Pulses: 2+ symmetric, upper and lower extremities, normal cap refill Ext: no edema   Assessment: Encounter Diagnoses  Name Primary?  . Essential hypertension, benign Yes  . Tobacco use disorder     Plan: HTN - increase to Lisinopril 20mg  daily.   Discussed diet, c/t exercise, return in August for physical, fasting labs.  She will have Medicare at that point and wants to defer labs til then.  Tobacco use - discussed risks, advised cessation.  She is not ready to quit.  Follow-up 04/2013

## 2013-01-25 NOTE — Patient Instructions (Signed)
YOU CAN QUIT SMOKING!  Talk to your medical provider about using medicines to help you quit. These include nicotine replacement gum, lozenges, or skin patches.  Consider calling 1-800-QUIT-NOW, a toll free 24/7 hotline with free counseling to help you quit.  If you are ready to quit smoking or are thinking about it, congratulations! You have chosen to help yourself be healthier and live longer! There are lots of different ways to quit smoking. Nicotine gum, nicotine patches, a nicotine inhaler, or nicotine nasal spray can help with physical craving. Hypnosis, support groups, and medicines help break the habit of smoking. TIPS TO GET OFF AND STAY OFF CIGARETTES  Learn to predict your moods. Do not let a bad situation be your excuse to have a cigarette. Some situations in your life might tempt you to have a cigarette.   Ask friends and co-workers not to smoke around you.   Make your home smoke-free.   Never have "just one" cigarette. It leads to wanting another and another. Remind yourself of your decision to quit.   On a card, make a list of your reasons for not smoking. Read it at least the same number of times a day as you have a cigarette. Tell yourself everyday, "I do not want to smoke. I choose not to smoke."   Ask someone at home or work to help you with your plan to quit smoking.   Have something planned after you eat or have a cup of coffee. Take a walk or get other exercise to perk you up. This will help to keep you from overeating.   Try a relaxation exercise to calm you down and decrease your stress. Remember, you may be tense and nervous the first two weeks after you quit. This will pass.   Find new activities to keep your hands busy. Play with a pen, coin, or rubber band. Doodle or draw things on paper.   Brush your teeth right after eating. This will help cut down the craving for the taste of tobacco after meals. You can try mouthwash too.   Try gum, breath mints, or diet  candy to keep something in your mouth.  IF YOU SMOKE AND WANT TO QUIT:  Do not stock up on cigarettes. Never buy a carton. Wait until one pack is finished before you buy another.   Never carry cigarettes with you at work or at home.   Keep cigarettes as far away from you as possible. Leave them with someone else.   Never carry matches or a lighter with you.   Ask yourself, "Do I need this cigarette or is this just a reflex?"   Bet with someone that you can quit. Put cigarette money in a piggy bank every morning. If you smoke, you give up the money. If you do not smoke, by the end of the week, you keep the money.   Keep trying. It takes 21 days to change a habit!  Document Released: 06/22/2009 Document Revised: 05/08/2011 Document Reviewed: 06/22/2009 [facility] Patient Information 2012 [place], [place].   Hypertension As your heart beats, it forces blood through your arteries. This force is your blood pressure. If the pressure is too high, it is called hypertension (HTN) or high blood pressure. HTN is dangerous because you may have it and not know it. High blood pressure may mean that your heart has to work harder to pump blood. Your arteries may be narrow or stiff. The extra work puts you at risk for heart  disease, stroke, and other problems.  Blood pressure consists of two numbers, a higher number over a lower, 110/72, for example. It is stated as "110 over 72." The ideal is below 120 for the top number (systolic) and under 80 for the bottom (diastolic). Write down your blood pressure today. You should pay close attention to your blood pressure if you have certain conditions such as:  Heart failure.  Prior heart attack.  Diabetes  Chronic kidney disease.  Prior stroke.  Multiple risk factors for heart disease. To see if you have HTN, your blood pressure should be measured while you are seated with your arm held at the level of the heart. It should be measured at least twice. A  one-time elevated blood pressure reading (especially in the Emergency Department) does not mean that you need treatment. There may be conditions in which the blood pressure is different between your right and left arms. It is important to see your caregiver soon for a recheck. Most people have essential hypertension which means that there is not a specific cause. This type of high blood pressure may be lowered by changing lifestyle factors such as:  Stress.  Smoking.  Lack of exercise.  Excessive weight.  Drug/tobacco/alcohol use.  Eating less salt. Most people do not have symptoms from high blood pressure until it has caused damage to the body. Effective treatment can often prevent, delay or reduce that damage. TREATMENT  When a cause has been identified, treatment for high blood pressure is directed at the cause. There are a large number of medications to treat HTN. These fall into several categories, and your caregiver will help you select the medicines that are best for you. Medications may have side effects. You should review side effects with your caregiver. If your blood pressure stays high after you have made lifestyle changes or started on medicines,   Your medication(s) may need to be changed.  Other problems may need to be addressed.  Be certain you understand your prescriptions, and know how and when to take your medicine.  Be sure to follow up with your caregiver within the time frame advised (usually within two weeks) to have your blood pressure rechecked and to review your medications.  If you are taking more than one medicine to lower your blood pressure, make sure you know how and at what times they should be taken. Taking two medicines at the same time can result in blood pressure that is too low. SEEK IMMEDIATE MEDICAL CARE IF:  You develop a severe headache, blurred or changing vision, or confusion.  You have unusual weakness or numbness, or a faint feeling.  You  have severe chest or abdominal pain, vomiting, or breathing problems. MAKE SURE YOU:   Understand these instructions.  Will watch your condition.  Will get help right away if you are not doing well or get worse. Document Released: 08/26/2005 Document Revised: 11/18/2011 Document Reviewed: 04/15/2008 [facility] Patient Information 2013 [place], [place].

## 2013-04-09 DIAGNOSIS — Z9289 Personal history of other medical treatment: Secondary | ICD-10-CM

## 2013-04-09 HISTORY — DX: Personal history of other medical treatment: Z92.89

## 2013-04-22 ENCOUNTER — Encounter: Payer: Self-pay | Admitting: Internal Medicine

## 2013-04-23 ENCOUNTER — Encounter: Payer: Self-pay | Admitting: Medical

## 2013-04-23 ENCOUNTER — Ambulatory Visit (INDEPENDENT_AMBULATORY_CARE_PROVIDER_SITE_OTHER): Payer: Medicare Other | Admitting: Medical

## 2013-04-23 VITALS — BP 132/82 | HR 92 | Temp 97.8°F | Resp 16 | Ht 62.0 in | Wt 169.0 lb

## 2013-04-23 DIAGNOSIS — E785 Hyperlipidemia, unspecified: Secondary | ICD-10-CM

## 2013-04-23 DIAGNOSIS — R7301 Impaired fasting glucose: Secondary | ICD-10-CM | POA: Diagnosis not present

## 2013-04-23 DIAGNOSIS — Z0000 Encounter for general adult medical examination without abnormal findings: Secondary | ICD-10-CM

## 2013-04-23 DIAGNOSIS — F17200 Nicotine dependence, unspecified, uncomplicated: Secondary | ICD-10-CM

## 2013-04-23 DIAGNOSIS — I10 Essential (primary) hypertension: Secondary | ICD-10-CM

## 2013-04-23 DIAGNOSIS — I8390 Asymptomatic varicose veins of unspecified lower extremity: Secondary | ICD-10-CM

## 2013-04-23 LAB — CBC WITH DIFFERENTIAL/PLATELET
Basophils Absolute: 0 10*3/uL (ref 0.0–0.1)
Basophils Relative: 0 % (ref 0–1)
HCT: 40.6 % (ref 36.0–46.0)
Hemoglobin: 13.8 g/dL (ref 12.0–15.0)
Lymphocytes Relative: 47 % — ABNORMAL HIGH (ref 12–46)
Lymphs Abs: 2.8 10*3/uL (ref 0.7–4.0)
MCH: 31.7 pg (ref 26.0–34.0)
MCHC: 34 g/dL (ref 30.0–36.0)
MCV: 93.1 fL (ref 78.0–100.0)
Monocytes Absolute: 0.4 10*3/uL (ref 0.1–1.0)
Monocytes Relative: 7 % (ref 3–12)
Neutro Abs: 2.7 10*3/uL (ref 1.7–7.7)
Neutrophils Relative %: 44 % (ref 43–77)
Platelets: 359 10*3/uL (ref 150–400)
RBC: 4.36 MIL/uL (ref 3.87–5.11)
RDW: 13.3 % (ref 11.5–15.5)
WBC: 6 10*3/uL (ref 4.0–10.5)

## 2013-04-23 LAB — POCT URINALYSIS DIPSTICK
Bilirubin, UA: NEGATIVE
Glucose, UA: NEGATIVE
Ketones, UA: NEGATIVE
Leukocytes, UA: NEGATIVE
Nitrite, UA: NEGATIVE
Protein, UA: NEGATIVE
Spec Grav, UA: 1.02
Urobilinogen, UA: NEGATIVE
pH, UA: 5

## 2013-04-23 LAB — HEMOGLOBIN A1C
Hgb A1c MFr Bld: 5.5 % (ref <5.7)
Mean Plasma Glucose: 111 mg/dL (ref <117)

## 2013-04-23 NOTE — Patient Instructions (Addendum)
Thank you for giving me the opportunity to serve you today.    Your diagnosis today includes: Encounter Diagnoses  Name Primary?  . Routine general medical examination at a health care facility Yes  . Essential hypertension, benign   . Dyslipidemia   . Tobacco use disorder   . Impaired fasting glucose     Specific recommendations today include:  I recommend a referral for baseline screening colonoscopy  I recommend an up to date mammogram  I recommend a screening bone density examination (check your insurance coverage for this)  I recommend pneumococcal and shingles (zostavax) vaccinations  Continue with getting physical activity, regular exercise  Eat a health low fat diet  Continue efforts to stop tobacco use.  Consider 1-800-QUIT-NOW to help stop tobacco.  Through either diet or supplements, take 1200mg  Calcium daily and 600 IU Vitamin D daily  See an eye doctor yearly for routine screening  Return at your convenience for breast/pelvic/rectal exam - screening  We are checking labs today for cholesterol, diabetes, liver, kidney and blood counts  Continue your current medications as usual  Follow up: pending labs   I have included other useful information below for your review.  Preventative Care for Adults - Female      MAINTAIN REGULAR HEALTH EXAMS AND YEARLY CHECK UP:  A routine yearly check up is a good way to check in with your primary care provider about your health and preventive screening. It is also an opportunity to share updates about your health and any concerns you have, and receive a thorough all-over exam.   WHAT PREVENTATIVE SERVICES DO WOMEN NEED?  Adult women should have their weight and blood pressure checked regularly.   Women should have their cholesterol levels checked periodically.  Women should be screened for cervical cancer with a Pap smear and pelvic exam ever 3-5 years.    Breast cancer screening with a mammogram and breast exam  by your primary care provider should be done every 1-2 years.  Women should be screened for colorectal cancer continuing to age 39.  Certain people may need continued testing until age 72.  Updating vaccinations is part of preventative care.  Vaccinations help protect against diseases such as the flu.  Osteoporosis is a disease in which the bones lose minerals and strength as we age. Women ages 66 and over should discuss this with their caregivers, as should women after menopause who have other risk factors.  Lab tests are generally done as part of preventative care to screen for anemia and blood disorders, to screen for problems with the kidneys and liver, to screen for bladder problems, to check blood sugar, and to check your cholesterol level.  Preventative services generally include counseling about diet, exercise, avoiding tobacco, drugs, excessive alcohol consumption, and sexually transmitted infections.    GENERAL RECOMMENDATIONS FOR GOOD HEALTH:  Healthy diet:  Eat a variety of foods, including fruit, vegetables, animal or vegetable protein, such as meat, fish, chicken, and eggs, or beans, lentils, tofu, and grains, such as rice.  Drink plenty of water daily.  Decrease saturated fat in the diet, avoid lots of red meat, processed foods, sweets, fast foods, and fried foods.  Exercise:  Aerobic exercise helps maintain good heart health. At least 30-40 minutes of moderate-intensity exercise is recommended. For example, a brisk walk that increases your heart rate and breathing. This should be done on most days of the week.   Find a type of exercise or a variety of exercises  that you enjoy so that it becomes a part of your daily life.  Examples are running, walking, swimming, water aerobics, and biking.  For motivation and support, explore group exercise such as aerobic class, spin class, Zumba, Yoga,or  martial arts, etc.    Set exercise goals for yourself, such as a certain weight  goal, walk or run in a race such as a 5k walk/run.  Speak to your primary care provider about exercise goals.  Disease prevention:  If you smoke or chew tobacco, find out from your caregiver how to quit. It can literally save your life, no matter how long you have been a tobacco user. If you do not use tobacco, never begin.   Maintain a healthy diet and normal weight. Increased weight leads to problems with blood pressure and diabetes.   The Body Mass Index or BMI is a way of measuring how much of your body is fat. Having a BMI above 27 increases the risk of heart disease, diabetes, hypertension, stroke and other problems related to obesity. Your caregiver can help determine your BMI and based on it develop an exercise and dietary program to help you achieve or maintain this important measurement at a healthful level.  High blood pressure causes heart and blood vessel problems.  Persistent high blood pressure should be treated with medicine if weight loss and exercise do not work.   Fat and cholesterol leaves deposits in your arteries that can block them. This causes heart disease and vessel disease elsewhere in your body.  If your cholesterol is found to be high, or if you have heart disease or certain other medical conditions, then you may need to have your cholesterol monitored frequently and be treated with medication.   Ask if you should have a cardiac stress test if your history suggests this. A stress test is a test done on a treadmill that looks for heart disease. This test can find disease prior to there being a problem.  Menopause can be associated with physical symptoms and risks. Hormone replacement therapy is available to decrease these. You should talk to your caregiver about whether starting or continuing to take hormones is right for you.   Osteoporosis is a disease in which the bones lose minerals and strength as we age. This can result in serious bone fractures. Risk of  osteoporosis can be identified using a bone density scan. Women ages 30 and over should discuss this with their caregivers, as should women after menopause who have other risk factors. Ask your caregiver whether you should be taking a calcium supplement and Vitamin D, to reduce the rate of osteoporosis.   Avoid drinking alcohol in excess (more than two drinks per day).  Avoid use of street drugs. Do not share needles with anyone. Ask for professional help if you need assistance or instructions on stopping the use of alcohol, cigarettes, and/or drugs.  Brush your teeth twice a day with fluoride toothpaste, and floss once a day. Good oral hygiene prevents tooth decay and gum disease. The problems can be painful, unattractive, and can cause other health problems. Visit your dentist for a routine oral and dental check up and preventive care every 6-12 months.   Look at your skin regularly.  Use a mirror to look at your back. Notify your caregivers of changes in moles, especially if there are changes in shapes, colors, a size larger than a pencil eraser, an irregular border, or development of new moles.  Safety:  Use  seatbelts 100% of the time, whether driving or as a passenger.  Use safety devices such as hearing protection if you work in environments with loud noise or significant background noise.  Use safety glasses when doing any work that could send debris in to the eyes.  Use a helmet if you ride a bike or motorcycle.  Use appropriate safety gear for contact sports.  Talk to your caregiver about gun safety.  Use sunscreen with a SPF (or skin protection factor) of 15 or greater.  Lighter skinned people are at a greater risk of skin cancer. Don't forget to also wear sunglasses in order to protect your eyes from too much damaging sunlight. Damaging sunlight can accelerate cataract formation.   Practice safe sex. Use condoms. Condoms are used for birth control and to help reduce the spread of sexually  transmitted infections (or STIs).  Some of the STIs are gonorrhea (the clap), chlamydia, syphilis, trichomonas, herpes, HPV (human papilloma virus) and HIV (human immunodeficiency virus) which causes AIDS. The herpes, HIV and HPV are viral illnesses that have no cure. These can result in disability, cancer and death.   Keep carbon monoxide and smoke detectors in your home functioning at all times. Change the batteries every 6 months or use a model that plugs into the wall.   Vaccinations:  Stay up to date with your tetanus shots and other required immunizations. You should have a booster for tetanus every 10 years. Be sure to get your flu shot every year, since 5%-20% of the [place] population comes down with the flu. The flu vaccine changes each year, so being vaccinated once is not enough. Get your shot in the fall, before the flu season peaks.   Other vaccines to consider:  Pneumococcal vaccine to protect against certain types of pneumonia.  This is normally recommended for adults age 88 or older.    Shingles vaccine to protect against Varicella Zoster if you are older than age 6, or younger than 65 years old with certain underlying illness.  Hepatitis A vaccine to protect against a form of infection of the liver by a virus acquired from food.  Hepatitis B vaccine to protect against a form of infection of the liver by a virus acquired from blood or body fluids, particularly if you work in health care.  If you plan to travel internationally, check with your local health department for specific vaccination recommendations.  Cancer Screening:  Breast cancer screening is essential to preventive care for women.  Women at ages 103 and older should have a mammogram (x-ray film) of the breasts. Your caregiver can discuss how often you need mammograms.    Most routine colon cancer screening begins at the age of 40. On a yearly basis, doctors may provide special easy to use take-home tests to check for  hidden blood in the stool. Sigmoidoscopy or colonoscopy can detect the earliest forms of colon cancer and is life saving. These tests use a small camera at the end of a tube to directly examine the colon. Speak to your caregiver about this at age 25, when routine screening begins (and is repeated every 5 years unless early forms of pre-cancerous polyps or small growths are found).   Fall Prevention and Home Safety Falls cause injuries and can affect all age groups. It is possible to use preventive measures to significantly decrease the likelihood of falls. There are many simple measures which can make your home safer and prevent falls. OUTDOORS  Repair cracks  and edges of walkways and driveways.  Remove high doorway thresholds.  Trim shrubbery on the main path into your home.  Have good outside lighting.  Clear walkways of tools, rocks, debris, and clutter.  Check that handrails are not broken and are securely fastened. Both sides of steps should have handrails.  Have leaves, snow, and ice cleared regularly.  Use sand or salt on walkways during winter months.  In the garage, clean up grease or oil spills. BATHROOM  Install night lights.  Install grab bars by the toilet and in the tub and shower.  Use non-skid mats or decals in the tub or shower.  Place a plastic non-slip stool in the shower to sit on, if needed.  Keep floors dry and clean up all water on the floor immediately.  Remove soap buildup in the tub or shower on a regular basis.  Secure bath mats with non-slip, double-sided rug tape.  Remove throw rugs and tripping hazards from the floors. BEDROOMS  Install night lights.  Make sure a bedside light is easy to reach.  Do not use oversized bedding.  Keep a telephone by your bedside.  Have a firm chair with side arms to use for getting dressed.  Remove throw rugs and tripping hazards from the floor. KITCHEN  Keep handles on pots and pans turned toward the  center of the stove. Use back burners when possible.  Clean up spills quickly and allow time for drying.  Avoid walking on wet floors.  Avoid hot utensils and knives.  Position shelves so they are not too high or low.  Place commonly used objects within easy reach.  If necessary, use a sturdy step stool with a grab bar when reaching.  Keep electrical cables out of the way.  Do not use floor polish or wax that makes floors slippery. If you must use wax, use non-skid floor wax.  Remove throw rugs and tripping hazards from the floor. STAIRWAYS  Never leave objects on stairs.  Place handrails on both sides of stairways and use them. Fix any loose handrails. Make sure handrails on both sides of the stairways are as long as the stairs.  Check carpeting to make sure it is firmly attached along stairs. Make repairs to worn or loose carpet promptly.  Avoid placing throw rugs at the top or bottom of stairways, or properly secure the rug with carpet tape to prevent slippage. Get rid of throw rugs, if possible.  Have an electrician put in a light switch at the top and bottom of the stairs. OTHER FALL PREVENTION TIPS  Wear low-heel or rubber-soled shoes that are supportive and fit well. Wear closed toe shoes.  When using a stepladder, make sure it is fully opened and both spreaders are firmly locked. Do not climb a closed stepladder.  Add color or contrast paint or tape to grab bars and handrails in your home. Place contrasting color strips on first and last steps.  Learn and use mobility aids as needed. Install an electrical emergency response system.  Turn on lights to avoid dark areas. Replace light bulbs that burn out immediately. Get light switches that glow.  Arrange furniture to create clear pathways. Keep furniture in the same place.  Firmly attach carpet with non-skid or double-sided tape.  Eliminate uneven floor surfaces.  Select a carpet pattern that does not visually  hide the edge of steps.  Be aware of all pets. OTHER HOME SAFETY TIPS  Set the water temperature for 120  F (48.8 C).  Keep emergency numbers on or near the telephone.  Keep smoke detectors on every level of the home and near sleeping areas. Document Released: 08/16/2002 Document Revised: 02/25/2012 Document Reviewed: 11/15/2011 [facility] Patient Information 2014 [place], [place].  Depression You have signs of depression. This is a common problem. It can occur at any age. It is often hard to recognize. People can suffer from depression and still have moments of enjoyment. Depression interferes with your basic ability to function in life. It upsets your relationships, sleep, eating, and work habits. CAUSES  Depression is believed to be caused by an imbalance in brain chemicals. It may be triggered by an unpleasant event. Relationship crises, a death in the family, financial worries, retirement, or other stressors are normal causes of depression. Depression may also start for no known reason. Other factors that may play a part include medical illnesses, some medicines, genetics, and alcohol or drug abuse. SYMPTOMS   Feeling unhappy or worthless.   Long-lasting (chronic) tiredness or worn-out feeling.   Self-destructive thoughts and actions.   Not being able to sleep or sleeping too much.   Eating more than usual or not eating at all.   Headaches or feeling anxious.   Trouble concentrating or making decisions.   Unexplained physical problems and substance abuse.  TREATMENT  Depression usually gets better with treatment. This can include:  Antidepressant medicines. It can take weeks before the proper dose is achieved and benefits are reached.   Talking with a therapist, clergyperson, counselor, or friend. These people can help you gain insight into your problem and regain control of your life.   Eating a good diet.   Getting regular physical exercise, such as walking for 30  minutes every day.   Not abusing alcohol or drugs.  Treating depression often takes 6 months or longer. This length of treatment is needed to keep symptoms from returning. Call your caregiver and arrange for follow-up care as suggested. SEEK IMMEDIATE MEDICAL CARE IF:   You start to have thoughts of hurting yourself or others.   Call your local emergency services (911 in [place]   Go to your local medical emergency department.   Call the National Suicide Prevention Lifeline: 1-800-273-TALK (228)439-0326).  Document Released: 08/26/2005 Document Revised: 08/15/2011 Document Reviewed: 01/26/2010 [facility] Patient Information 2012 [place], [place].

## 2013-04-23 NOTE — Progress Notes (Signed)
Subjective:   HPI  Alyssa Carter is a 67 y.o. female who presents for a "welcome to Medicare physical/IPPE."  Care team/other providers:  Primary Care: [facility], Kristian Covey, PA-C  Ophthalmology: yes  Last visit 4 years ago - [facility]  Dentist: N/a.    Activities of Daily Living  In your present state of health, do you have any difficulty performing the following activities?:  Preparing food and eating?: No Bathing yourself: No Getting dressed: No Using the toilet:No Moving around from place to place: No In the past year have you fallen or had a near fall?:No  Depression Screen (Note: if answer to either of the following is "Yes", then a more complete depression screening is indicated)  Q1: Over the past two weeks, have you felt down, depressed or hopeless?no Q2: Over the past two weeks, have you felt little interest or pleasure in doing things? no   Preventative care Last colonoscopy: never.  Last mammogram:07/2007 Last gynecological exam:years ago Last EKG: 04/23/2013  Prior vaccinations: TD or Tdap: 2012 Influenza never Pneumococcal: n/a Shingles/Zostavax: n/a  Patient denies have an Advanced directive. Patient does have a Health care power of attorney. Patient denies have a Living will.  Current diet: in general, a "healthy" diet    Current exercise: walking  Concerns: Varicose veins, left leg.   Uses Ted hose in the winter.  Past Medical History  Diagnosis Date  . Hypertension   . Dyslipidemia   . Wears glasses   . Full dentures   . Tobacco use   . Hematuria     microscopic, several prior evaluations, no source or cause found  . H/O mammogram 2010  . H/O bone density study 8/14    never  . Influenza vaccine side effect     intolerance, declines  . Impaired fasting blood sugar 2008    Past Surgical History  Procedure Laterality Date  . Knee surgery      right  . Tonsillectomy    . Appendectomy  1980  .  Spine surgery      lumbar  . Colonoscopy  8/14    never, declines  . Abdominal hysterectomy  1980    total; due to heavy bleeding  . Tubal ligation      History   Social History  . Marital Status: Legally Separated    Spouse Name: N/A    Number of Children: N/A  . Years of Education: N/A   Occupational History  . Not on file.   Social History Main Topics  . Smoking status: Current Every Day Smoker -- 0.50 packs/day for 40 years  . Smokeless tobacco: Not on file  . Alcohol Use: No  . Drug Use: No  . Sexual Activity: Not on file   Other Topics Concern  . Not on file   Social History Narrative   Lives with her son.  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 Alzheimer's disease Mother   . Other Mother     died of UTI, dehydration  . Other Father 34    failure to thrive, old age, fall and rib fracture  . Diabetes Father   . Diabetes Sister   . Cancer Brother     brain, stomach    Current outpatient prescriptions:ibuprofen (ADVIL,MOTRIN) 800 MG tablet, Take 1 tablet (800 mg total) by mouth every 8 (eight) hours as needed for  pain., Disp: 20 tablet, Rfl: 0;  lisinopril (PRINIVIL,ZESTRIL) 20 MG tablet, Take 1 tablet (20 mg total) by mouth daily., Disp: 30 tablet, Rfl: 3;  Multiple Vitamin (MULTIVITAMIN) capsule, Take 1 capsule by mouth daily., Disp: , Rfl:   Allergies  Allergen Reactions  . Influenza Vaccines     Nausea, vomiting, ill    Reviewed their medical, surgical, family, social, medication, and allergy history and updated chart as appropriate.  Review of Systems Constitutional: -fever, -chills, -sweats, -unexpected weight change, -decreased appetite, -fatigue Allergy: -sneezing, -itching, -congestion Dermatology: -changing moles, --rash, -lumps ENT: -runny nose, -ear pain, -sore throat, -hoarseness, -sinus pain, -teeth pain, {+ ringing in ears, -hearing loss,  -nosebleeds Cardiology: -chest pain, -palpitations, +swelling, -difficulty breathing when lying flat, -waking up short of breath Respiratory: -cough, -shortness of breath, -difficulty breathing with exercise or exertion, -wheezing, -coughing up blood Gastroenterology: -abdominal pain, -nausea, -vomiting, -diarrhea, -constipation, -blood in stool, -changes in bowel movement, -difficulty swallowing or eating Hematology: -bleeding, -bruising  Musculoskeletal: -joint aches, -muscle aches, -joint swelling, -back pain, -neck pain, -cramping, -changes in gait Ophthalmology: denies vision changes, eye redness, itching, discharge Urology: -burning with urination, -difficulty urinating, -blood in urine, -urinary frequency, -urgency, -incontinence Neurology: -headache, -weakness, -tingling, -numbness, -memory loss, -falls, -dizziness Psychology: -depressed mood, -agitation, -sleep problems    Adult ECG Report  Indication: HTN, IPPE physical for medicare  Rate: 85 bpm  Rhythm: normal sinus rhythm  QRS Axis: 18 degrees  PR Interval:  QRS Duration:  QTc:  Conduction Disturbances: incomplete RBBB  Other Abnormalities: none  Patient's cardiac risk factors are: dyslipidemia, hypertension, obesity (BMI >= 30 kg/m2) and smoking/ tobacco exposure.  EKG comparison: none  Narrative Interpretation: incomplete RBBB, otherwise normal       Objective:   Physical Exam   BP 132/82  Pulse 92  Temp(Src) 97.8 F (36.6 C) (Oral)  Resp 16  Ht 5\' 2"  (1.575 m)  Wt 169 lb (76.658 kg)  BMI 30.9 kg/m2   General appearance: alert, no distress, WD/WN, female Skin: scattered macules, freckles, no worrisome lesions HEENT: normocephalic, conjunctiva/corneas normal, sclerae anicteric, PERRLA, EOMi, nares patent, no discharge or erythema, pharynx normal Oral cavity: MMM, tongue normal, upper and lower dentures Neck: supple, no lymphadenopathy, no thyromegaly, no masses, normal ROM, no  bruits Chest: non tender, normal shape and expansion Heart: RRR, normal S1, S2, no murmurs Lungs: CTA bilaterally, no wheezes, rhonchi, or rales Abdomen: +bs, soft, non tender, non distended, no masses, no hepatomegaly, no splenomegaly, no bruits Back: non tender, normal ROM, no scoliosis Musculoskeletal: upper extremities non tender, no obvious deformity, normal ROM throughout, lower extremities non tender, no obvious deformity, normal ROM throughout Extremities: left leg moderate lower leg varicosities, otherwise no edema, no cyanosis, no clubbing Pulses: 2+ symmetric, upper and lower extremities, normal cap refill Neurological: alert, oriented x 3, CN2-12 intact, strength normal upper extremities and lower extremities, sensation normal throughout, DTRs 2+ throughout, no cerebellar signs, gait normal Psychiatric: normal affect, behavior normal, pleasant  Breast/gyn/rectal deferred, declined  Assessment and Plan :    Encounter Diagnoses  Name Primary?  . Routine general medical examination at a health care facility Yes  . Essential hypertension, benign   . Dyslipidemia   . Tobacco use disorder   . Impaired fasting glucose   . Varicose vein      HTN - controlled, c/t current medication Dyslipidemia - discussed prior labs.  Repeat fasting labs today Tobacco - advised cessation, encouraged her to work on efforts  to stop Impaired fasting glucose - hgba1c today, glucose level Varicose veins - advised regular exercise, Ted hose year round  During the course of the visit the patient was educated and counseled about appropriate screening and preventive services including:    Pneumococcal vaccine   Hepatitis B vaccine  Td vaccine  Screening electrocardiogram  Screening mammography  Screening Pap smear and pelvic exam   Bone densitometry screening  Colorectal cancer screening  Diabetes screening  Glaucoma screening  Nutrition counseling   Smoking cessation  counseling  Advanced directives: has an advanced directive - a copy HAS NOT been provided.  Decisions:   She declines but will consider baseline screening colonoscopy  She declines but will consider mammogram  She declines buta will consider  screening bone density examination.  She completed screening questionaire.  She declines pneumococcal and shingles (zostavax) vaccinations  Continue efforts to stop tobacco use.  Consider 1-800-QUIT-NOW to help stop tobacco.  Through either diet or supplements, take 1200mg  Calcium daily and 600 IU Vitamin D daily  Recommended eye doctor yearly for routine screening  Return at your convenience for breast/pelvic/rectal exam - screening  We are checking labs today for cholesterol, diabetes, liver, kidney and blood counts  Continue your current medications as usual   Medicare Attestation I have personally reviewed: The patient's medical and social history Their use of alcohol, tobacco or illicit drugs Their current medications and supplements The patient's functional ability including ADLs,fall risks, home safety risks, cognitive, and hearing and visual impairment Diet and physical activities Evidence for depression or mood disorders  The patient's weight, height, BMI, and visual acuity have been recorded in the chart.  I have made referrals, counseling, and provided education to the patient based on review of the above and I have provided the patient with a written personalized care plan for preventive services.     Ernst Breach, PA-C   04/23/2013

## 2013-04-24 LAB — COMPREHENSIVE METABOLIC PANEL
ALT: 25 U/L (ref 0–35)
AST: 18 U/L (ref 0–37)
Albumin: 4.4 g/dL (ref 3.5–5.2)
Alkaline Phosphatase: 86 U/L (ref 39–117)
BUN: 16 mg/dL (ref 6–23)
CO2: 26 mEq/L (ref 19–32)
Calcium: 9.7 mg/dL (ref 8.4–10.5)
Chloride: 105 mEq/L (ref 96–112)
Creat: 0.71 mg/dL (ref 0.50–1.10)
Glucose, Bld: 97 mg/dL (ref 70–99)
Potassium: 5.4 mEq/L — ABNORMAL HIGH (ref 3.5–5.3)
Total Bilirubin: 0.4 mg/dL (ref 0.3–1.2)
Total Protein: 6.9 g/dL (ref 6.0–8.3)

## 2013-04-24 LAB — LIPID PANEL
Cholesterol: 252 mg/dL — ABNORMAL HIGH (ref 0–200)
HDL: 38 mg/dL — ABNORMAL LOW (ref >39)
Total CHOL/HDL Ratio: 6.6 Ratio
Triglycerides: 409 mg/dL — ABNORMAL HIGH (ref <150)

## 2013-04-30 ENCOUNTER — Encounter: Payer: Self-pay | Admitting: Medical

## 2013-05-05 ENCOUNTER — Encounter: Payer: Self-pay | Admitting: Medical

## 2013-05-24 ENCOUNTER — Telehealth: Payer: Self-pay | Admitting: Medical

## 2013-05-24 MED ORDER — LISINOPRIL 20 MG PO TABS: 20.0000 mg | ORAL_TABLET | Freq: Every day | ORAL | Status: DC

## 2013-05-24 NOTE — Telephone Encounter (Signed)
Rx refill sent to the pharmacy. CLS 

## 2013-06-25 DIAGNOSIS — Z23 Encounter for immunization: Secondary | ICD-10-CM | POA: Diagnosis not present

## 2013-07-15 ENCOUNTER — Other Ambulatory Visit: Payer: Self-pay

## 2013-07-20 DIAGNOSIS — M79609 Pain in unspecified limb: Secondary | ICD-10-CM | POA: Diagnosis not present

## 2013-07-20 DIAGNOSIS — I8310 Varicose veins of unspecified lower extremity with inflammation: Secondary | ICD-10-CM | POA: Diagnosis not present

## 2013-08-02 DIAGNOSIS — I8310 Varicose veins of unspecified lower extremity with inflammation: Secondary | ICD-10-CM | POA: Diagnosis not present

## 2013-08-02 DIAGNOSIS — M79609 Pain in unspecified limb: Secondary | ICD-10-CM | POA: Diagnosis not present

## 2013-08-09 DIAGNOSIS — M79609 Pain in unspecified limb: Secondary | ICD-10-CM | POA: Diagnosis not present

## 2013-08-09 DIAGNOSIS — I8310 Varicose veins of unspecified lower extremity with inflammation: Secondary | ICD-10-CM | POA: Diagnosis not present

## 2013-10-21 DIAGNOSIS — I8310 Varicose veins of unspecified lower extremity with inflammation: Secondary | ICD-10-CM | POA: Diagnosis not present

## 2013-11-03 DIAGNOSIS — M79609 Pain in unspecified limb: Secondary | ICD-10-CM | POA: Diagnosis not present

## 2013-11-03 DIAGNOSIS — I8310 Varicose veins of unspecified lower extremity with inflammation: Secondary | ICD-10-CM | POA: Diagnosis not present

## 2013-11-05 DIAGNOSIS — I8310 Varicose veins of unspecified lower extremity with inflammation: Secondary | ICD-10-CM | POA: Diagnosis not present

## 2013-11-05 DIAGNOSIS — M79609 Pain in unspecified limb: Secondary | ICD-10-CM | POA: Diagnosis not present

## 2013-11-19 DIAGNOSIS — I8310 Varicose veins of unspecified lower extremity with inflammation: Secondary | ICD-10-CM | POA: Diagnosis not present

## 2013-11-19 DIAGNOSIS — M79609 Pain in unspecified limb: Secondary | ICD-10-CM | POA: Diagnosis not present

## 2013-12-06 DIAGNOSIS — I8310 Varicose veins of unspecified lower extremity with inflammation: Secondary | ICD-10-CM | POA: Diagnosis not present

## 2013-12-06 DIAGNOSIS — M79609 Pain in unspecified limb: Secondary | ICD-10-CM | POA: Diagnosis not present

## 2013-12-20 DIAGNOSIS — I872 Venous insufficiency (chronic) (peripheral): Secondary | ICD-10-CM | POA: Diagnosis not present

## 2013-12-20 DIAGNOSIS — I8310 Varicose veins of unspecified lower extremity with inflammation: Secondary | ICD-10-CM | POA: Diagnosis not present

## 2013-12-22 DIAGNOSIS — I872 Venous insufficiency (chronic) (peripheral): Secondary | ICD-10-CM | POA: Diagnosis not present

## 2013-12-22 DIAGNOSIS — I8310 Varicose veins of unspecified lower extremity with inflammation: Secondary | ICD-10-CM | POA: Diagnosis not present

## 2014-01-05 DIAGNOSIS — I8310 Varicose veins of unspecified lower extremity with inflammation: Secondary | ICD-10-CM | POA: Diagnosis not present

## 2014-01-05 DIAGNOSIS — M7981 Nontraumatic hematoma of soft tissue: Secondary | ICD-10-CM | POA: Diagnosis not present

## 2014-01-05 DIAGNOSIS — M79609 Pain in unspecified limb: Secondary | ICD-10-CM | POA: Diagnosis not present

## 2014-01-10 ENCOUNTER — Ambulatory Visit (INDEPENDENT_AMBULATORY_CARE_PROVIDER_SITE_OTHER): Payer: Medicare Other | Admitting: Medical

## 2014-01-10 ENCOUNTER — Encounter: Payer: Self-pay | Admitting: Medical

## 2014-01-10 VITALS — BP 150/80 | HR 88 | Temp 98.2°F | Resp 16 | Wt 167.0 lb

## 2014-01-10 DIAGNOSIS — IMO0002 Reserved for concepts with insufficient information to code with codable children: Secondary | ICD-10-CM

## 2014-01-10 DIAGNOSIS — M79609 Pain in unspecified limb: Secondary | ICD-10-CM

## 2014-01-10 DIAGNOSIS — I10 Essential (primary) hypertension: Secondary | ICD-10-CM

## 2014-01-10 DIAGNOSIS — M5380 Other specified dorsopathies, site unspecified: Secondary | ICD-10-CM | POA: Diagnosis not present

## 2014-01-10 DIAGNOSIS — M62830 Muscle spasm of back: Secondary | ICD-10-CM

## 2014-01-10 DIAGNOSIS — M79601 Pain in right arm: Secondary | ICD-10-CM

## 2014-01-10 DIAGNOSIS — S43401A Unspecified sprain of right shoulder joint, initial encounter: Secondary | ICD-10-CM

## 2014-01-10 MED ORDER — LISINOPRIL 20 MG PO TABS: 20.0000 mg | ORAL_TABLET | Freq: Every day | ORAL | Status: DC

## 2014-01-10 MED ORDER — CYCLOBENZAPRINE HCL 5 MG PO TABS: 5.0000 mg | ORAL_TABLET | Freq: Every day | ORAL | Status: DC

## 2014-01-10 MED ORDER — HYDROCODONE-ACETAMINOPHEN 5-325 MG PO TABS: 1.0000 | ORAL_TABLET | Freq: Four times a day (QID) | ORAL | Status: DC | PRN

## 2014-01-10 NOTE — Patient Instructions (Signed)
  Thank you for giving me the opportunity to serve you today.    Your diagnosis today includes: Encounter Diagnoses  Name Primary?  . Sprain of shoulder, right Yes  . Back spasm   . Arm pain, right      Specific recommendations today include:  For the next 7-10 days, use arm sling OTC on and off   Continue using some ice for pain and inflammation  Use Ibuprofen OTC 3 tablets 3 times daily for 5-7 days  At night time you may use Flexeril as needed for upper back spasm  If needed, use Hydrocodone for worse pain  Do daily gentle stretching with neck, shoulder and arms  Return if not improving.

## 2014-01-10 NOTE — Progress Notes (Signed)
Subjective: Here for right arm pain.  2 wk was pulling the cord on the mower and it jerked her right arm.  Since then has had pain in right upper back, pain down right arm and below elbow.  Has decreased ROM.  If she tries to turn shoulder such as external rotation, real bad pain.  Didn't hear pop.  Has been using ice pack multiple times daily, used some icy hot, ibuprofen which helped some.  Denies numbness, tingling, weakness.  Main issue is pain.  Taking 600mg  Ibuprofen q6hours.   Arm feels swollen a bit.  No other injury, trauma, or fall.  No other aggravating or relieving factors. No other c/o.  ROS as in subjective  Objective: Gen: wd, wn, nad Skin: no erythema or ecchymosis Neck: nontender, normal ROM MSK: tender over right supraspinatus, tender over biceps origin, tender posterior shoulder, tender right lateral upper arm, otherwise arms nontender, normal ROM, no deformity Back: Tender right upper paraspinal region, +spasm UE neurovascularly intact    Assessment:  Encounter Diagnoses  Name Primary?  . Sprain of shoulder, right Yes  . Back spasm   . Arm pain, right   . Essential hypertension, benign       Plan: Inflammation, sprain of shoulder and spasm of upper back   Specific recommendations today include:  For the next 7-10 days, use arm sling OTC on and off   Continue using some ice for pain and inflammation  Use Ibuprofen OTC 3 tablets 3 times daily for 5-7 days  At night time you may use Flexeril as needed for upper back spasm  If needed, use Hydrocodone for worse pain  Do daily gentle stretching with neck, shoulder and arms  HTN - c/t same medication  F/u prn.

## 2014-01-20 DIAGNOSIS — M79609 Pain in unspecified limb: Secondary | ICD-10-CM | POA: Diagnosis not present

## 2014-01-20 DIAGNOSIS — I8310 Varicose veins of unspecified lower extremity with inflammation: Secondary | ICD-10-CM | POA: Diagnosis not present

## 2014-01-20 DIAGNOSIS — M7981 Nontraumatic hematoma of soft tissue: Secondary | ICD-10-CM | POA: Diagnosis not present

## 2014-02-03 DIAGNOSIS — I8310 Varicose veins of unspecified lower extremity with inflammation: Secondary | ICD-10-CM | POA: Diagnosis not present

## 2014-02-03 DIAGNOSIS — M7981 Nontraumatic hematoma of soft tissue: Secondary | ICD-10-CM | POA: Diagnosis not present

## 2014-02-03 DIAGNOSIS — M79609 Pain in unspecified limb: Secondary | ICD-10-CM | POA: Diagnosis not present

## 2014-03-30 ENCOUNTER — Ambulatory Visit (INDEPENDENT_AMBULATORY_CARE_PROVIDER_SITE_OTHER): Payer: Medicare Other | Admitting: Family Medicine

## 2014-03-30 ENCOUNTER — Encounter: Payer: Self-pay | Admitting: Family Medicine

## 2014-03-30 VITALS — BP 140/74 | HR 80 | Temp 98.3°F | Ht 62.0 in | Wt 166.0 lb

## 2014-03-30 DIAGNOSIS — R05 Cough: Secondary | ICD-10-CM | POA: Diagnosis not present

## 2014-03-30 DIAGNOSIS — R490 Dysphonia: Secondary | ICD-10-CM

## 2014-03-30 DIAGNOSIS — R059 Cough, unspecified: Secondary | ICD-10-CM

## 2014-03-30 DIAGNOSIS — J309 Allergic rhinitis, unspecified: Secondary | ICD-10-CM | POA: Diagnosis not present

## 2014-03-30 DIAGNOSIS — F17200 Nicotine dependence, unspecified, uncomplicated: Secondary | ICD-10-CM | POA: Diagnosis not present

## 2014-03-30 NOTE — Patient Instructions (Signed)
  Allergies with postnasal drainage causing hoarseness and cough.  No evidence of bacterial infection. Patient is a smoker, so cannot r/o other, more concerning etiologies at this point, so if symptoms persist/worsen, might need ENT evaluation.  Drink plenty of fluids/water. Start taking claritin (loratidine) 10mg  once daily. I also recommend trial of Mucinex (guaifenesin--an expectorant that loosens up the mucus) Consider trying sinus rinses (neti-pot or sinus rinse kit). If you have ongoing symptoms that do not respond to these measures, we may need to refer you to ENT for further evaluation, given your smoking history.  Return here if you develop fever, discolored mucus, sinus pain, as an antibiotic is likely indicated then (rather than referral to ENT).  Please try and quit smoking--start thinking about why/when you smoke (habit, boredom, stress) in order to come up with effective strategies to cut back or quit. Available resources to help you quit include free counseling through [facility] Quitline ([facility]uitline[facility] or 1-800-QUITNOW), smoking cessation classes through [facility] (call to find out schedule), over-the-counter nicotine replacements, and e-cigarettes (although this may not help break the hand-mouth habit).  Many insurance companies also have smoking cessation programs (which may decrease the cost of patches, meds if enrolled).  If these methods are not effective for you, and you are motivated to quit, return to discuss the possibility of prescription medications.

## 2014-03-30 NOTE — Progress Notes (Signed)
Chief Complaint  Patient presents with  . Ear Fullness    feels like she has right sided ear pressure, no pain.    She moved into a new office that is very cold (2 weeks ago).  Since then she has noticed pressure in her right ear, and she feels like something is draining down the right side of her throat/neck.  She has frequent sneezing, no runny nose.  Last night she "choked/strangled"--occurred while sitting in chair, not drinking/eating.  She feels like she has post-nasal drainage in her throat that she is unable to get up.  She has had some slight hoarseness since choking spell last night.    She has a dry cough when she has the drainage in her throat.  Only notices the drainage on the right side. Denies pain in the right ear, just pressure.  No plugging/popping or decreased hearing in the right ear.  She hasn't tried any OTC medications for these symptoms.  Lots of people sneezing at work, no other sick contacts.  Past Medical History  Diagnosis Date  . Hypertension   . Dyslipidemia   . Wears glasses   . Full dentures   . Tobacco use   . Hematuria     microscopic, several prior evaluations, no source or cause found  . H/O mammogram 2010  . H/O bone density study 8/14    never  . Influenza vaccine side effect     intolerance, declines  . Impaired fasting blood sugar 2008   Past Surgical History  Procedure Laterality Date  . Knee surgery      right  . Tonsillectomy    . Appendectomy  1980  . Spine surgery      lumbar  . Colonoscopy  8/14    never, declines  . Abdominal hysterectomy  1980    total; due to heavy bleeding  . Tubal ligation     History   Social History  . Marital Status: Legally Separated    Spouse Name: N/A    Number of Children: N/A  . Years of Education: N/A   Occupational History  . Not on file.   Social History Main Topics  . Smoking status: Current Every Day Smoker -- 0.50 packs/day for 40 years  . Smokeless tobacco: Not on file  . Alcohol  Use: No  . Drug Use: No  . Sexual Activity: Not on file   Other Topics Concern  . Not on file   Social History Narrative   Lives alone (son moved out).  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 lisinopril (PRINIVIL,ZESTRIL) 20 MG tablet Take 1 tablet (20 mg total) by mouth daily.   . Multiple Vitamin (MULTIVITAMIN) capsule Take 1 capsule by mouth daily.   . Omega-3 Fatty Acids (FISH OIL) 1000 MG CAPS Take by mouth.   Marland Kitchen ibuprofen (ADVIL,MOTRIN) 800 MG tablet Take 1 tablet (800 mg total) by mouth every 8 (eight) hours as needed for pain. 03/30/2014: Uses prn--has not been taking recently  . [DISCONTINUED] cyclobenzaprine (FLEXERIL) 5 MG tablet Take 1 tablet (5 mg total) by mouth at bedtime.   . [DISCONTINUED] HYDROcodone-acetaminophen (NORCO/VICODIN) 5-325 MG per tablet Take 1 tablet by mouth every 6 (six) hours as needed for moderate pain.    Allergies  Allergen Reactions  . Influenza Vaccines     Nausea, vomiting, ill   (she states she tolerated flu shot  fine last year).  ROS:  Denies fevers, chills, dizziness. Slight headache at right temple since yesterday.  Denies sinus pain.  Denies sore throat.  Denies any h/o chronic cough (from ACEI or smoking), only x 2 weeks.  Denies shortness of breath, wheezing, bleeding, bruising, rashes.  She has heartburn only when she eats onions or tomatoes (not frequent), no dysphagia.  PHYSICAL EXAM: BP 140/74  Pulse 80  Temp(Src) 98.3 F (36.8 C) (Tympanic)  Ht _0  (1.575 m)  Wt 166 lb (75.297 kg)  BMI 30.35 kg/m2 Pleasant female, with slightly hoarse voice, and frequent throat-clearing during the exam.  No cough. HEENT:  PERRL, EOMI, conjunctiva clear.  TM's and EAC's normal.  Nasal mucosa is mildly edematous, pale, no purulence.  OP is clear (dentures). No erythema or lesions Neck: no lymphadenopathy, thyromegaly or mass Heart:  regular rate and rhythm without  Murmur Lungs: clear bilaterally Skin: no rash Psych: normal mood, affect, hygiene and grooming Neuro: alert and oriented.  Cranial nerves intact. Normal strength, gait  ASSESSMENT/PLAN:  Cough  Allergic rhinitis, cause unspecified  Hoarseness  Tobacco use disorder   Allergies with postnasal drainage causing hoarseness and cough.  No evidence of bacterial infection. Patient is a smoker, so cannot r/o other, more concerning etiologies at this point, so if symptoms persist/worsen, might need ENT evaluation.  Drink plenty of fluids/water. Start taking claritin (loratidine) 76m once daily. I also recommend trial of Mucinex (guaifenesin--an expectorant that loosens up the mucus) Consider trying sinus rinses (neti-pot or sinus rinse kit). If you have ongoing symptoms that do not respond to these measures, we may need to refer you to ENT for further evaluation, given your smoking history.  Return here if you develop fever, discolored mucus, sinus pain, as an antibiotic is likely indicated then (rather than referral to ENT).   Counseled extensively re: smoking risks, and reasons to try and quit again.

## 2014-06-01 DIAGNOSIS — Z23 Encounter for immunization: Secondary | ICD-10-CM | POA: Diagnosis not present

## 2014-06-02 ENCOUNTER — Encounter: Payer: Self-pay | Admitting: Internal Medicine

## 2014-06-06 DIAGNOSIS — H2510 Age-related nuclear cataract, unspecified eye: Secondary | ICD-10-CM | POA: Diagnosis not present

## 2014-07-26 ENCOUNTER — Other Ambulatory Visit: Payer: Self-pay | Admitting: Medical

## 2014-07-26 NOTE — Telephone Encounter (Signed)
Pt called for RF to [facility]

## 2014-07-26 NOTE — Telephone Encounter (Signed)
PATIENT NEEDS AN OFFICE VISIT AS SOON AS POSSIBLE

## 2014-08-28 ENCOUNTER — Emergency Department (INDEPENDENT_AMBULATORY_CARE_PROVIDER_SITE_OTHER)
Admission: EM | Admit: 2014-08-28 | Discharge: 2014-08-28 | Disposition: A | Discharge status: Home / Self Care | Payer: Medicare Other | Source: Home / Self Care | Attending: Family Medicine | Admitting: Family Medicine

## 2014-08-28 ENCOUNTER — Encounter (HOSPITAL_COMMUNITY): Payer: Self-pay

## 2014-08-28 DIAGNOSIS — G5622 Lesion of ulnar nerve, left upper limb: Secondary | ICD-10-CM

## 2014-08-28 MED ORDER — DICLOFENAC SODIUM 1 % TD GEL: 2.0000 g | Freq: Four times a day (QID) | TRANSDERMAL | Status: DC

## 2014-08-28 MED ORDER — TRAMADOL HCL 50 MG PO TABS: 50.0000 mg | ORAL_TABLET | Freq: Four times a day (QID) | ORAL | Status: DC | PRN

## 2014-08-28 NOTE — Discharge Instructions (Signed)
Cubital Tunnel Syndrome (Ulnar Neuritis) Cubital tunnel syndrome is a disorder of the nervous system of the elbow and upper arm the causes pain, tingling, weakness of the hand, or the loss of feeling in the ring and little fingers. The disorder is caused by a compression or stretching of the ulnar nerve at the elbow and in the forearm by muscles or ligament-like tissues. The ulnar nerve is susceptible to injury because it has little tissue that protects it at the elbow. The lack of protection increases the risk of injury. Cubital tunnel syndrome may decrease athletic performance in sports that require strong hand or wrist actions (tennis or racquetball).  SYMPTOMS   Clumsiness or weakness of the hand.  Poor dexterity (fine hand function).  Tenderness of the inner elbow.  Aching or soreness of the inner elbow.  Increased pain with forced full-elbow bending.  Reduced control with throwing, such as pitching.  Tingling, numbness, or burning inside the forearm or in part of the hand or fingers (especially the little finger or ring finger).  Sharp pains that shoot from the elbow down to the wrist and hand.  A weak grip, especially power grip, and a weak pinch.  Reduced performance in sports that require a strong grip. CAUSES   Increased pressure on the ulnar nerve at the elbow, arm, or forearm caused by swollen, inflamed, or scarred tissues; ligament-like; or between muscles.  Stretching of the nerve due to loose elbow ligaments.  Trauma to the nerve at the elbow.  Repetitive elbow bending. RISK INCREASES WITH:  Poor strength or flexibility.  Inadequate warm-up properly physical activity.  Diabetes mellitus.  under-active thyroid gland(hypothyroidism).  Repetitive and/or strenuous throwing motions such as baseball and javelin throwing.  Contact sports (football, soccer, rugby, or lacrosse).  Other elbow conditions (medial epicondylitis or loose inner elbow  ligaments). PREVENTION  Warm up and stretch properly before activity.  Maintain physical fitness:  Wrist, forearm, and elbow flexibility.  Muscle strength and endurance.  Cardiovascular fitness.  Wear proper protective equipment, including elbow pads.  Learn and use proper throwing techniques. PROGNOSIS  Cubital tunnel syndrome is typically curable is treated appropriately. Is some cases the condition may heal without treatment. If the muscle begins to waste or the nerve damage worsens, surgery may be necessary. RELATED COMPLICATIONS   Permanent numbness and weakness of the ring and little fingers.  Weak grip.  Permanent paralysis of some hand and finger muscles.  Risks associated with surgery, including infection, bleeding, injury to nerves (including the ulnar nerve), recurrent or continued symptoms, and elbow stiffness. TREATMENT  Treatment initially involves stopping the activities that cause the symptoms to worsen. Medications and ice can be used to reduce pain in inflammation. Splinting and protecting the elbow with padding (especially at night) to prevent full bending of the elbow may help. Stretching and strengthening exercises of the muscles of the forearm and elbow are important, and they may be performed at home or with the assistance of a therapist. If conservative treatment is not successful, surgery may be necessary to reduce compression of the nerve. Before return to sport, assessment for proper throwing and hitting mechanics is important.  MEDICATION   If pain medication is necessary, nonsteroidal anti-inflammatory medications, such as aspirin and ibuprofen, or other minor pain relievers, such as acetaminophen, are often recommended. Contact your caregiver immediately if any bleeding, stomach upset, or signs of an allergic reaction occur.  Prescription pain relievers are usually only prescribed after surgery. Use only as directed and  only as much as you need. COLD  THERAPY   Cold treatment (icing) relieves pain and reduces inflammation. Cold treatment should be applied for 10 to 15 minutes every 2 to 3 hours for inflammation and pain and immediately after any activity that aggravates your symptoms. Use ice packs or an ice massage. SEEK MEDICAL CARE IF:   Symptoms get worse or do not improve in 2 weeks despite treatment.  You experience pain, numbness, or coldness in the hand.  Blue, gray, or dark color appears in the fingernails.  Any of the following occur after surgery: increased pain, swelling, redness, drainage, or bleeding in the surgical area or signs of infection.  New, unexplained symptoms develop (drugs used in treatment may produce side effects). Document Released: 08/26/2005 Document Revised: 11/18/2011 Document Reviewed: 12/08/2008 [facility] Patient Information 2015 [place], [place]. This information is not intended to replace advice given to you by your health care provider. Make sure you discuss any questions you have with your health care provider.

## 2014-08-28 NOTE — ED Provider Notes (Signed)
CSN: [id number]     Arrival date & time 08/28/14  1357 History   First MD Initiated Contact with Patient 08/28/14 1438     Chief Complaint  Patient presents with  . Numbness   (Consider location/radiation/quality/duration/timing/severity/associated sxs/prior Treatment) HPI           68 year old female presents for evaluation of numbness in her left arm. This is been present for about a week on and off. She believes that it is due to overworking her arm. She recently started a job at [employer] where she is on her feet for 10 hours at a time using her arms constantly. This has caused pain in her elbow and she gets a feeling that her arm goes to sleep. This is worse at night. She has tried icing it and taking ibuprofen with minimal relief. She has never had this before. She denies any injury. She denies any numbness or weakness elsewhere. She denies any neurologic symptoms such as difficulty with ambulation, difficulty speaking, or other areas of numbness or weakness    Past Medical History  Diagnosis Date  . Hypertension   . Dyslipidemia   . Wears glasses   . Full dentures   . Tobacco use   . Hematuria     microscopic, several prior evaluations, no source or cause found  . H/O mammogram 2010  . H/O bone density study 8/14    never  . Influenza vaccine side effect     intolerance, declines  . Impaired fasting blood sugar 2008   Past Surgical History  Procedure Laterality Date  . Knee surgery      right  . Tonsillectomy    . Appendectomy  1980  . Spine surgery      lumbar  . Colonoscopy  8/14    never, declines  . Abdominal hysterectomy  1980    total; due to heavy bleeding  . Tubal ligation     Family History  Problem Relation Age of Onset  . Alzheimer's disease Mother   . Other Mother     died of UTI, dehydration  . Other Father 72    failure to thrive, old age, fall and rib fracture  . Diabetes Father   . Diabetes Sister   . Cancer Brother     brain, stomach    History  Substance Use Topics  . Smoking status: Current Every Day Smoker -- 0.50 packs/day for 40 years  . Smokeless tobacco: Not on file  . Alcohol Use: No   OB History    No data available     Review of Systems  Musculoskeletal: Negative for gait problem.  Neurological: Positive for weakness and numbness. Negative for dizziness and facial asymmetry.  All other systems reviewed and are negative.   Allergies  Influenza vaccines  Home Medications   Prior to Admission medications   Medication Sig Start Date End Date Taking? Authorizing Provider  diclofenac sodium (VOLTAREN) 1 % GEL Apply 2 g topically 4 (four) times daily. 08/28/14   Liam Graham, PA-C  ibuprofen (ADVIL,MOTRIN) 800 MG tablet Take 1 tablet (800 mg total) by mouth every 8 (eight) hours as needed for pain. 09/28/12   Camelia Eng Tysinger, PA-C  lisinopril (PRINIVIL,ZESTRIL) 20 MG tablet TAKE ONE TABLET BY MOUTH ONCE DAILY 07/26/14   Carlena Hurl, PA-C  Multiple Vitamin (MULTIVITAMIN) capsule Take 1 capsule by mouth daily.    Historical Provider, MD  Omega-3 Fatty Acids (FISH OIL) 1000 MG CAPS Take by mouth.  Historical Provider, MD  traMADol (ULTRAM) 50 MG tablet Take 1 tablet (50 mg total) by mouth every 6 (six) hours as needed. 08/28/14   Freeman Caldron Glade Strausser, PA-C   BP 201/94 mmHg  Pulse 76  Temp(Src) 98.2 F (36.8 C) (Oral)  Resp 16  SpO2 96% Physical Exam  Constitutional: She is oriented to person, place, and time. Vital signs are normal. She appears well-developed and well-nourished. No distress.  HENT:  Head: Normocephalic and atraumatic.  Cardiovascular: Normal rate, regular rhythm, normal heart sounds and intact distal pulses.   Pulmonary/Chest: Effort normal and breath sounds normal. No respiratory distress.  Musculoskeletal:       Left elbow: She exhibits normal range of motion, no swelling, no effusion and no deformity. Tenderness (tenderness between the lateral epicondyle and the olecranon  process ) found.  Neurological: She is alert and oriented to person, place, and time. She has normal reflexes. A sensory deficit (diffuse generalized subjective sensory deficit in the left upper extremity distal to the elbow only ) is present. No cranial nerve deficit. She exhibits normal muscle tone. Coordination normal.  Grip strength in the left is 3/5, right is 5/5  Skin: Skin is warm and dry. No rash noted. She is not diaphoretic.  Psychiatric: She has a normal mood and affect. Judgment normal.  Nursing note and vitals reviewed.   ED Course  Procedures (including critical care time) Labs Review Labs Reviewed - No data to display  Imaging Review No results found.   MDM   1. Cubital tunnel syndrome, left    History and physical is consistent with cubital tunnel syndrome. Treat with Voltaren gel and Ultram. She has an orthopedist, she will follow-up there no improvement in a week. Advised ice as well  Meds ordered this encounter  Medications  . diclofenac sodium (VOLTAREN) 1 % GEL    Sig: Apply 2 g topically 4 (four) times daily.    Dispense:  200 g    Refill:  0  . traMADol (ULTRAM) 50 MG tablet    Sig: Take 1 tablet (50 mg total) by mouth every 6 (six) hours as needed.    Dispense:  15 tablet    Refill:  0       Liam Graham, PA-C 08/28/14 [phone]

## 2014-08-28 NOTE — ED Notes (Signed)
C/o pain and numbness in her left arm past couple of days. Starts in elbow and goes into hand. Wakes her up at night .does seasonal work in store , and is on keyboard all day

## 2014-09-12 ENCOUNTER — Other Ambulatory Visit (INDEPENDENT_AMBULATORY_CARE_PROVIDER_SITE_OTHER): Payer: Medicare Other

## 2014-09-12 DIAGNOSIS — Z111 Encounter for screening for respiratory tuberculosis: Secondary | ICD-10-CM | POA: Diagnosis not present

## 2014-09-14 LAB — TB SKIN TEST
Induration: 0 mm
TB Skin Test: NEGATIVE

## 2014-09-27 ENCOUNTER — Other Ambulatory Visit: Payer: Self-pay | Admitting: Medical

## 2014-09-27 NOTE — Telephone Encounter (Signed)
Pt did make an medcheck appt for next week.

## 2014-09-27 NOTE — Telephone Encounter (Signed)
What medicine needs refill, it doesn't say?

## 2014-10-04 ENCOUNTER — Ambulatory Visit (INDEPENDENT_AMBULATORY_CARE_PROVIDER_SITE_OTHER): Payer: Medicare Other | Admitting: Medical

## 2014-10-04 ENCOUNTER — Encounter: Payer: Self-pay | Admitting: Medical

## 2014-10-04 VITALS — BP 148/90 | HR 103 | Temp 98.2°F | Resp 16 | Wt 162.0 lb

## 2014-10-04 DIAGNOSIS — Z7189 Other specified counseling: Secondary | ICD-10-CM | POA: Diagnosis not present

## 2014-10-04 DIAGNOSIS — R7301 Impaired fasting glucose: Secondary | ICD-10-CM

## 2014-10-04 DIAGNOSIS — I10 Essential (primary) hypertension: Secondary | ICD-10-CM

## 2014-10-04 DIAGNOSIS — F17200 Nicotine dependence, unspecified, uncomplicated: Secondary | ICD-10-CM

## 2014-10-04 DIAGNOSIS — Z720 Tobacco use: Secondary | ICD-10-CM | POA: Diagnosis not present

## 2014-10-04 DIAGNOSIS — E785 Hyperlipidemia, unspecified: Secondary | ICD-10-CM | POA: Diagnosis not present

## 2014-10-04 LAB — COMPREHENSIVE METABOLIC PANEL
ALT: 18 U/L (ref 0–35)
AST: 16 U/L (ref 0–37)
Albumin: 4.4 g/dL (ref 3.5–5.2)
Alkaline Phosphatase: 92 U/L (ref 39–117)
BILIRUBIN TOTAL: 0.4 mg/dL (ref 0.2–1.2)
BUN: 10 mg/dL (ref 6–23)
CO2: 26 mEq/L (ref 19–32)
Calcium: 10 mg/dL (ref 8.4–10.5)
Chloride: 103 mEq/L (ref 96–112)
Creat: 0.61 mg/dL (ref 0.50–1.10)
Glucose, Bld: 84 mg/dL (ref 70–99)
Potassium: 4.9 mEq/L (ref 3.5–5.3)
Sodium: 139 mEq/L (ref 135–145)
TOTAL PROTEIN: 6.9 g/dL (ref 6.0–8.3)

## 2014-10-04 LAB — CBC
HEMATOCRIT: 42.6 % (ref 36.0–46.0)
Hemoglobin: 14.6 g/dL (ref 12.0–15.0)
MCH: 31.7 pg (ref 26.0–34.0)
MCHC: 34.3 g/dL (ref 30.0–36.0)
MCV: 92.6 fL (ref 78.0–100.0)
MPV: 9.3 fL (ref 8.6–12.4)
Platelets: 334 10*3/uL (ref 150–400)
RBC: 4.6 MIL/uL (ref 3.87–5.11)
RDW: 13.3 % (ref 11.5–15.5)
WBC: 6.9 10*3/uL (ref 4.0–10.5)

## 2014-10-04 LAB — LIPID PANEL
CHOLESTEROL: 269 mg/dL — AB (ref 0–200)
HDL: 39 mg/dL — ABNORMAL LOW (ref >39)
Total CHOL/HDL Ratio: 6.9 Ratio
Triglycerides: 520 mg/dL — ABNORMAL HIGH (ref <150)

## 2014-10-04 LAB — HEMOGLOBIN A1C
Hgb A1c MFr Bld: 5.7 % — ABNORMAL HIGH (ref <5.7)
MEAN PLASMA GLUCOSE: 117 mg/dL — AB (ref <117)

## 2014-10-04 MED ORDER — LISINOPRIL-HYDROCHLOROTHIAZIDE 20-25 MG PO TABS: 1.0000 | ORAL_TABLET | Freq: Every day | ORAL | Status: DC

## 2014-10-04 NOTE — Progress Notes (Signed)
Subjective: Here for f/u on HTN.  Currently taking Lisinopril without compliant.  Exercising with walking, was doing 3 days a week until the snow.   Diet -  Eats healthy, doesn't eat pork, but does eat hamburger once every 2 weeks.  Still smokes, but has slowed down.  Trying to give it up.  Uses sea salt sparingly.  No other c/o.  Last physical was 04/2013 at which time she refused routine cancer screening and several recommendations.  Past Medical History  Diagnosis Date  . Hypertension   . Dyslipidemia   . Wears glasses   . Full dentures   . Tobacco use   . Hematuria     microscopic, several prior evaluations, no source or cause found  . H/O mammogram 2010  . H/O bone density study 8/14    never  . Influenza vaccine side effect     intolerance, declines  . Impaired fasting blood sugar 2008   ROS as in subjective  Objective: Filed Vitals:   10/04/14 0848  BP: 148/90  Pulse: 103  Temp: 98.2 F (36.8 C)  Resp: 16   General appearance: alert, no distress, WD/WN Neck: supple, no lymphadenopathy, no thyromegaly, no masses, no bruits Heart: RRR, normal S1, S2, no murmurs Lungs: CTA bilaterally, no wheezes, rhonchi, or rales Abdomen: +bs, soft, non tender, non distended, no masses, no hepatomegaly, no splenomegaly Pulses: 2+ symmetric, upper and lower extremities, normal cap refill Ext: no edema   Adult ECG Report  Indication: elevated BP  Rate: 76bpm  Rhythm: normal sinus rhythm  QRS Axis: 28 degrees  PR Interval: 19ms  QRS Duration: 162ms  QTc: 438ms  Conduction Disturbances: none  Other Abnormalities: none  Patient's cardiac risk factors are: advanced age (older than 89 for men, 17 for women), dyslipidemia, hypertension and smoking/ tobacco exposure.  EKG comparison: 04/2013  Narrative Interpretation: no acute changes     Assessment: Encounter Diagnoses  Name Primary?  . Essential hypertension Yes  . Smoker   . Impaired fasting blood sugar   . Hyperlipidemia    . Counseling on health promotion and disease prevention    Plan: HTN - change to Lisinopril HCT.  discussed need to exercise regularly, eat healthy and quit tobacco.  Recheck 28mo Smoker - trying to quit, but refuses medication or counseling impaired glucose - labs today hyperlipidemia - refused statin 04/2013.  Labs today counseling - recommended CPX, mammogram, colonoscopy, discussed benefits but she declines

## 2014-10-05 ENCOUNTER — Other Ambulatory Visit: Payer: Self-pay | Admitting: Medical

## 2014-10-05 MED ORDER — ATORVASTATIN CALCIUM 20 MG PO TABS: 20.0000 mg | ORAL_TABLET | Freq: Every day | ORAL | Status: DC

## 2014-10-13 ENCOUNTER — Other Ambulatory Visit: Payer: Self-pay

## 2014-10-13 DIAGNOSIS — Z1231 Encounter for screening mammogram for malignant neoplasm of breast: Secondary | ICD-10-CM

## 2014-10-26 ENCOUNTER — Ambulatory Visit
Admission: RE | Admit: 2014-10-26 | Discharge: 2014-10-26 | Disposition: A | Discharge status: Home / Self Care | Payer: Medicare Other | Source: Ambulatory Visit

## 2014-10-26 DIAGNOSIS — Z1231 Encounter for screening mammogram for malignant neoplasm of breast: Secondary | ICD-10-CM

## 2014-10-26 NOTE — Progress Notes (Signed)
Reported to patient. 

## 2014-11-05 ENCOUNTER — Emergency Department (HOSPITAL_COMMUNITY)
Admission: EM | Admit: 2014-11-05 | Discharge: 2014-11-05 | Disposition: A | Discharge status: Home / Self Care | Payer: Medicare Other | Attending: Emergency Medicine | Admitting: Emergency Medicine

## 2014-11-05 ENCOUNTER — Encounter (HOSPITAL_COMMUNITY): Payer: Self-pay | Admitting: Emergency Medicine

## 2014-11-05 ENCOUNTER — Encounter (HOSPITAL_COMMUNITY): Payer: Self-pay | Admitting: Cardiology

## 2014-11-05 ENCOUNTER — Emergency Department (HOSPITAL_COMMUNITY): Payer: Medicare Other

## 2014-11-05 ENCOUNTER — Emergency Department (INDEPENDENT_AMBULATORY_CARE_PROVIDER_SITE_OTHER)
Admission: EM | Admit: 2014-11-05 | Discharge: 2014-11-05 | Disposition: A | Discharge status: Home / Self Care | Payer: Medicare Other | Source: Home / Self Care | Attending: Emergency Medicine | Admitting: Emergency Medicine

## 2014-11-05 DIAGNOSIS — Z972 Presence of dental prosthetic device (complete) (partial): Secondary | ICD-10-CM | POA: Insufficient documentation

## 2014-11-05 DIAGNOSIS — S0990XA Unspecified injury of head, initial encounter: Secondary | ICD-10-CM | POA: Insufficient documentation

## 2014-11-05 DIAGNOSIS — I10 Essential (primary) hypertension: Secondary | ICD-10-CM | POA: Diagnosis not present

## 2014-11-05 DIAGNOSIS — E785 Hyperlipidemia, unspecified: Secondary | ICD-10-CM | POA: Insufficient documentation

## 2014-11-05 DIAGNOSIS — Z79899 Other long term (current) drug therapy: Secondary | ICD-10-CM | POA: Insufficient documentation

## 2014-11-05 DIAGNOSIS — Z720 Tobacco use: Secondary | ICD-10-CM | POA: Diagnosis not present

## 2014-11-05 DIAGNOSIS — S060X0A Concussion without loss of consciousness, initial encounter: Secondary | ICD-10-CM

## 2014-11-05 DIAGNOSIS — Y939 Activity, unspecified: Secondary | ICD-10-CM | POA: Insufficient documentation

## 2014-11-05 DIAGNOSIS — W228XXA Striking against or struck by other objects, initial encounter: Secondary | ICD-10-CM | POA: Diagnosis not present

## 2014-11-05 DIAGNOSIS — R112 Nausea with vomiting, unspecified: Secondary | ICD-10-CM | POA: Diagnosis not present

## 2014-11-05 DIAGNOSIS — R51 Headache: Secondary | ICD-10-CM | POA: Diagnosis not present

## 2014-11-05 DIAGNOSIS — Y929 Unspecified place or not applicable: Secondary | ICD-10-CM | POA: Insufficient documentation

## 2014-11-05 DIAGNOSIS — Y998 Other external cause status: Secondary | ICD-10-CM | POA: Diagnosis not present

## 2014-11-05 DIAGNOSIS — Z973 Presence of spectacles and contact lenses: Secondary | ICD-10-CM | POA: Insufficient documentation

## 2014-11-05 DIAGNOSIS — R42 Dizziness and giddiness: Secondary | ICD-10-CM | POA: Diagnosis not present

## 2014-11-05 MED ORDER — ACETAMINOPHEN 325 MG PO TABS
ORAL_TABLET | ORAL | Status: AC
  Filled 2014-11-05: qty 2

## 2014-11-05 MED ORDER — ONDANSETRON 4 MG PO TBDP: 4.0000 mg | ORAL_TABLET | Freq: Three times a day (TID) | ORAL | Status: DC | PRN

## 2014-11-05 MED ORDER — ONDANSETRON 4 MG PO TBDP: 4.0000 mg | ORAL_TABLET | Freq: Once | ORAL | Status: DC

## 2014-11-05 MED ORDER — ACETAMINOPHEN 325 MG PO TABS
650.0000 mg | ORAL_TABLET | Freq: Once | ORAL | Status: AC
  Administered 2014-11-05: 650 mg via ORAL

## 2014-11-05 MED ORDER — ONDANSETRON 4 MG PO TBDP
4.0000 mg | ORAL_TABLET | Freq: Once | ORAL | Status: AC
  Administered 2014-11-05: 4 mg via ORAL
  Filled 2014-11-05: qty 1

## 2014-11-05 NOTE — Discharge Instructions (Signed)
As we discussed, I am concerned that you remain symptomatic following your head injury this morning and it would be my recommendation that you continue your evaluation in the emergency room upon being discharged from [facility]. The ER provider may wish to send you for brain imaging.  Head Injury You have received a head injury. It does not appear serious at this time. Headaches and vomiting are common following head injury. It should be easy to awaken from sleeping. Sometimes it is necessary for you to stay in the emergency department for a while for observation. Sometimes admission to the hospital may be needed. After injuries such as yours, most problems occur within the first 24 hours, but side effects may occur up to 7-10 days after the injury. It is important for you to carefully monitor your condition and contact your health care provider or seek immediate medical care if there is a change in your condition. WHAT ARE THE TYPES OF HEAD INJURIES? Head injuries can be as minor as a bump. Some head injuries can be more severe. More severe head injuries include:  A jarring injury to the brain (concussion).  A bruise of the brain (contusion). This mean there is bleeding in the brain that can cause swelling.  A cracked skull (skull fracture).  Bleeding in the brain that collects, clots, and forms a bump (hematoma). WHAT CAUSES A HEAD INJURY? A serious head injury is most likely to happen to someone who is in a car wreck and is not wearing a seat belt. Other causes of major head injuries include bicycle or motorcycle accidents, sports injuries, and falls. HOW ARE HEAD INJURIES DIAGNOSED? A complete history of the event leading to the injury and your current symptoms will be helpful in diagnosing head injuries. Many times, pictures of the brain, such as CT or MRI are needed to see the extent of the injury. Often, an overnight hospital stay is necessary for observation.  WHEN SHOULD I SEEK IMMEDIATE MEDICAL  CARE?  You should get help right away if:  You have confusion or drowsiness.  You feel sick to your stomach (nauseous) or have continued, forceful vomiting.  You have dizziness or unsteadiness that is getting worse.  You have severe, continued headaches not relieved by medicine. Only take over-the-counter or prescription medicines for pain, fever, or discomfort as directed by your health care provider.  You do not have normal function of the arms or legs or are unable to walk.  You notice changes in the black spots in the center of the colored part of your eye (pupil).  You have a clear or bloody fluid coming from your nose or ears.  You have a loss of vision. During the next 24 hours after the injury, you must stay with someone who can watch you for the warning signs. This person should contact local emergency services (911 in the [place] if you have seizures, you become unconscious, or you are unable to wake up. HOW CAN I PREVENT A HEAD INJURY IN THE FUTURE? The most important factor for preventing major head injuries is avoiding motor vehicle accidents. To minimize the potential for damage to your head, it is crucial to wear seat belts while riding in motor vehicles. Wearing helmets while bike riding and playing collision sports (like football) is also helpful. Also, avoiding dangerous activities around the house will further help reduce your risk of head injury.  WHEN CAN I RETURN TO NORMAL ACTIVITIES AND ATHLETICS? You should be reevaluated by your  health care provider before returning to these activities. If you have any of the following symptoms, you should not return to activities or contact sports until 1 week after the symptoms have stopped:  Persistent headache.  Dizziness or vertigo.  Poor attention and concentration.  Confusion.  Memory problems.  Nausea or vomiting.  Fatigue or tire easily.  Irritability.  Intolerant of bright lights or loud noises.  Anxiety or  depression.  Disturbed sleep. MAKE SURE YOU:   Understand these instructions.  Will watch your condition.  Will get help right away if you are not doing well or get worse. Document Released: 08/26/2005 Document Revised: 08/31/2013 Document Reviewed: 05/03/2013 [facility] Patient Information 2015 [place], [place]. This information is not intended to replace advice given to you by your health care provider. Make sure you discuss any questions you have with your health care provider.  Concussion A concussion, or closed-head injury, is a brain injury caused by a direct blow to the head or by a quick and sudden movement (jolt) of the head or neck. Concussions are usually not life-threatening. Even so, the effects of a concussion can be serious. If you have had a concussion before, you are more likely to experience concussion-like symptoms after a direct blow to the head.  CAUSES  Direct blow to the head, such as from running into another player during a soccer game, being hit in a fight, or hitting your head on a hard surface.  A jolt of the head or neck that causes the brain to move back and forth inside the skull, such as in a car crash. SIGNS AND SYMPTOMS The signs of a concussion can be hard to notice. Early on, they may be missed by you, family members, and health care providers. You may look fine but act or feel differently. Symptoms are usually temporary, but they may last for days, weeks, or even longer. Some symptoms may appear right away while others may not show up for hours or days. Every head injury is different. Symptoms include:  Mild to moderate headaches that will not go away.  A feeling of pressure inside your head.  Having more trouble than usual:  Learning or remembering things you have heard.  Answering questions.  Paying attention or concentrating.  Organizing daily tasks.  Making decisions and solving problems.  Slowness in thinking, acting or reacting,  speaking, or reading.  Getting lost or being easily confused.  Feeling tired all the time or lacking energy (fatigued).  Feeling drowsy.  Sleep disturbances.  Sleeping more than usual.  Sleeping less than usual.  Trouble falling asleep.  Trouble sleeping (insomnia).  Loss of balance or feeling lightheaded or dizzy.  Nausea or vomiting.  Numbness or tingling.  Increased sensitivity to:  Sounds.  Lights.  Distractions.  Vision problems or eyes that tire easily.  Diminished sense of taste or smell.  Ringing in the ears.  Mood changes such as feeling sad or anxious.  Becoming easily irritated or angry for little or no reason.  Lack of motivation.  Seeing or hearing things other people do not see or hear (hallucinations). DIAGNOSIS Your health care provider can usually diagnose a concussion based on a description of your injury and symptoms. He or she will ask whether you passed out (lost consciousness) and whether you are having trouble remembering events that happened right before and during your injury. Your evaluation might include:  A brain scan to look for signs of injury to the brain. Even if  the test shows no injury, you may still have a concussion.  Blood tests to be sure other problems are not present. TREATMENT  Concussions are usually treated in an emergency department, in urgent care, or at a clinic. You may need to stay in the hospital overnight for further treatment.  Tell your health care provider if you are taking any medicines, including prescription medicines, over-the-counter medicines, and natural remedies. Some medicines, such as blood thinners (anticoagulants) and aspirin, may increase the chance of complications. Also tell your health care provider whether you have had alcohol or are taking illegal drugs. This information may affect treatment.  Your health care provider will send you home with important instructions to follow.  How fast  you will recover from a concussion depends on many factors. These factors include how severe your concussion is, what part of your brain was injured, your age, and how healthy you were before the concussion.  Most people with mild injuries recover fully. Recovery can take time. In general, recovery is slower in older persons. Also, persons who have had a concussion in the past or have other medical problems may find that it takes longer to recover from their current injury. HOME CARE INSTRUCTIONS General Instructions  Carefully follow the directions your health care provider gave you.  Only take over-the-counter or prescription medicines for pain, discomfort, or fever as directed by your health care provider.  Take only those medicines that your health care provider has approved.  Do not drink alcohol until your health care provider says you are well enough to do so. Alcohol and certain other drugs may slow your recovery and can put you at risk of further injury.  If it is harder than usual to remember things, write them down.  If you are easily distracted, try to do one thing at a time. For example, do not try to watch TV while fixing dinner.  Talk with family members or close friends when making important decisions.  Keep all follow-up appointments. Repeated evaluation of your symptoms is recommended for your recovery.  Watch your symptoms and tell others to do the same. Complications sometimes occur after a concussion. Older adults with a brain injury may have a higher risk of serious complications, such as a blood clot on the brain.  Tell your teachers, school nurse, school counselor, coach, athletic trainer, or work Freight forwarder about your injury, symptoms, and restrictions. Tell them about what you can or cannot do. They should watch for:  Increased problems with attention or concentration.  Increased difficulty remembering or learning new information.  Increased time needed to  complete tasks or assignments.  Increased irritability or decreased ability to cope with stress.  Increased symptoms.  Rest. Rest helps the brain to heal. Make sure you:  Get plenty of sleep at night. Avoid staying up late at night.  Keep the same bedtime hours on weekends and weekdays.  Rest during the day. Take daytime naps or rest breaks when you feel tired.  Limit activities that require a lot of thought or concentration. These include:  Doing homework or job-related work.  Watching TV.  Working on the computer.  Avoid any situation where there is potential for another head injury (football, hockey, soccer, basketball, martial arts, downhill snow sports and horseback riding). Your condition will get worse every time you experience a concussion. You should avoid these activities until you are evaluated by the appropriate follow-up health care providers. Returning To Your Regular Activities You will need  to return to your normal activities slowly, not all at once. You must give your body and brain enough time for recovery.  Do not return to sports or other athletic activities until your health care provider tells you it is safe to do so.  Ask your health care provider when you can drive, ride a bicycle, or operate heavy machinery. Your ability to react may be slower after a brain injury. Never do these activities if you are dizzy.  Ask your health care provider about when you can return to work or school. Preventing Another Concussion It is very important to avoid another brain injury, especially before you have recovered. In rare cases, another injury can lead to permanent brain damage, brain swelling, or death. The risk of this is greatest during the first 7-10 days after a head injury. Avoid injuries by:  Wearing a seat belt when riding in a car.  Drinking alcohol only in moderation.  Wearing a helmet when biking, skiing, skateboarding, skating, or doing similar  activities.  Avoiding activities that could lead to a second concussion, such as contact or recreational sports, until your health care provider says it is okay.  Taking safety measures in your home.  Remove clutter and tripping hazards from floors and stairways.  Use grab bars in bathrooms and handrails by stairs.  Place non-slip mats on floors and in bathtubs.  Improve lighting in dim areas. SEEK MEDICAL CARE IF:  You have increased problems paying attention or concentrating.  You have increased difficulty remembering or learning new information.  You need more time to complete tasks or assignments than before.  You have increased irritability or decreased ability to cope with stress.  You have more symptoms than before. Seek medical care if you have any of the following symptoms for more than 2 weeks after your injury:  Lasting (chronic) headaches.  Dizziness or balance problems.  Nausea.  Vision problems.  Increased sensitivity to noise or light.  Depression or mood swings.  Anxiety or irritability.  Memory problems.  Difficulty concentrating or paying attention.  Sleep problems.  Feeling tired all the time. SEEK IMMEDIATE MEDICAL CARE IF:  You have severe or worsening headaches. These may be a sign of a blood clot in the brain.  You have weakness (even if only in one hand, leg, or part of the face).  You have numbness.  You have decreased coordination.  You vomit repeatedly.  You have increased sleepiness.  One pupil is larger than the other.  You have convulsions.  You have slurred speech.  You have increased confusion. This may be a sign of a blood clot in the brain.  You have increased restlessness, agitation, or irritability.  You are unable to recognize people or places.  You have neck pain.  It is difficult to wake you up.  You have unusual behavior changes.  You lose consciousness. MAKE SURE YOU:  Understand these  instructions.  Will watch your condition.  Will get help right away if you are not doing well or get worse. Document Released: 11/16/2003 Document Revised: 08/31/2013 Document Reviewed: 03/18/2013 [facility] Patient Information 2015 [place], [place]. This information is not intended to replace advice given to you by your health care provider. Make sure you discuss any questions you have with your health care provider.

## 2014-11-05 NOTE — ED Provider Notes (Signed)
CSN: [id number]     Arrival date & time 11/05/14  1505 History   First MD Initiated Contact with Patient 11/05/14 1933     Chief Complaint  Patient presents with  . Dizziness     (Consider location/radiation/quality/duration/timing/severity/associated sxs/prior Treatment) Patient is a 69 y.o. female presenting with dizziness. The history is provided by the patient.  Dizziness Quality:  Head spinning Severity:  Mild Associated symptoms: headaches, nausea and vomiting   Associated symptoms: no shortness of breath    Patient does with head injury. Patient had stainless steel shower curtain rod hit her on the head. This was 10:00 in the morning. Followed by 2 episodes of vomiting now has dizziness and nausea. Sore spot on top of the head. No neck pain. No other injuries.  Past Medical History  Diagnosis Date  . Hypertension   . Dyslipidemia   . Wears glasses   . Full dentures   . Tobacco use   . Hematuria     microscopic, several prior evaluations, no source or cause found  . H/O mammogram 2010  . H/O bone density study 8/14    never  . Influenza vaccine side effect     intolerance, declines  . Impaired fasting blood sugar 2008   Past Surgical History  Procedure Laterality Date  . Knee surgery      right  . Tonsillectomy    . Appendectomy  1980  . Spine surgery      lumbar  . Colonoscopy  8/14    never, declines  . Abdominal hysterectomy  1980    total; due to heavy bleeding  . Tubal ligation     Family History  Problem Relation Age of Onset  . Alzheimer's disease Mother   . Other Mother     died of UTI, dehydration  . Other Father 19    failure to thrive, old age, fall and rib fracture  . Diabetes Father   . Diabetes Sister   . Cancer Brother     brain, stomach   History  Substance Use Topics  . Smoking status: Current Every Day Smoker -- 0.50 packs/day for 40 years  . Smokeless tobacco: Not on file  . Alcohol Use: No   OB History    No data available      Review of Systems  Constitutional: Negative for fever.  HENT: Negative for congestion.   Eyes: Negative for visual disturbance.  Respiratory: Negative for shortness of breath.   Gastrointestinal: Positive for nausea and vomiting. Negative for abdominal pain.  Musculoskeletal: Negative for back pain and neck pain.  Skin: Negative for rash.  Neurological: Positive for dizziness and headaches.  Hematological: Does not bruise/bleed easily.  Psychiatric/Behavioral: Negative for confusion.      Allergies  Review of patient's allergies indicates no known allergies.  Home Medications   Prior to Admission medications   Medication Sig Start Date End Date Taking? Authorizing Provider  atorvastatin (LIPITOR) 20 MG tablet Take 1 tablet (20 mg total) by mouth daily. Patient taking differently: Take 20 mg by mouth at bedtime.  10/05/14  Yes Camelia Eng Tysinger, PA-C  ibuprofen (ADVIL,MOTRIN) 200 MG tablet Take 600 mg by mouth every 6 (six) hours as needed (pain).   Yes Historical Provider, MD  lisinopril-hydrochlorothiazide (PRINZIDE,ZESTORETIC) 20-25 MG per tablet Take 1 tablet by mouth daily. 10/04/14  Yes Camelia Eng Tysinger, PA-C  Multiple Vitamin (MULTIVITAMIN WITH MINERALS) TABS tablet Take 1 tablet by mouth daily.   Yes Historical Provider, MD  Omega-3 Fatty Acids (FISH OIL) 500 MG CAPS Take 500 mg by mouth daily.   Yes Historical Provider, MD  ondansetron (ZOFRAN ODT) 4 MG disintegrating tablet Take 1 tablet (4 mg total) by mouth every 8 (eight) hours as needed for nausea or vomiting. 11/05/14   Fredia Sorrow, MD   BP 132/64 mmHg  Pulse 70  Temp(Src) 97.8 F (36.6 C)  Resp 14  Ht 5\' 2"  (1.575 m)  Wt 160 lb (72.576 kg)  BMI 29.26 kg/m2  SpO2 97% Physical Exam  Constitutional: She is oriented to person, place, and time. She appears well-developed and well-nourished. No distress.  HENT:  Head: Normocephalic.  Patient with a bump on the right top of her head measuring about 2 x 2  centimeters. No laceration or abrasion. Mild tenderness to that area.  Eyes: Conjunctivae and EOM are normal. Pupils are equal, round, and reactive to light.  Neck: Normal range of motion. Neck supple.  Cardiovascular: Normal rate, regular rhythm and normal heart sounds.   No murmur heard. Pulmonary/Chest: Breath sounds normal. No respiratory distress.  Abdominal: Soft. Bowel sounds are normal. There is no tenderness.  Musculoskeletal: Normal range of motion.  Neurological: She is alert and oriented to person, place, and time. No cranial nerve deficit. She exhibits normal muscle tone. Coordination normal.  Skin: Skin is warm.  Nursing note and vitals reviewed.   ED Course  Procedures (including critical care time) Labs Review Labs Reviewed - No data to display  Imaging Review Ct Head Wo Contrast  11/05/2014   CLINICAL DATA:  Right head injury, headache, dizziness, vomiting  EXAM: CT HEAD WITHOUT CONTRAST  TECHNIQUE: Contiguous axial images were obtained from the base of the skull through the vertex without intravenous contrast.  COMPARISON:  MRI brain dated 10/04/2003  FINDINGS: No evidence of parenchymal hemorrhage or extra-axial fluid collection. No mass lesion, mass effect, or midline shift.  No CT evidence of acute infarction.  Subcortical white matter and periventricular small vessel ischemic changes.  Cerebral volume is within normal limits.  No ventriculomegaly.  The visualized paranasal sinuses are essentially clear. The mastoid air cells are unopacified.  No evidence of calvarial fracture.  IMPRESSION: No evidence of acute intracranial abnormality.  Small vessel ischemic changes.   Electronically Signed   By: Julian Hy M.D.   On: 11/05/2014 21:27     EKG Interpretation None      MDM   Final diagnoses:  Head injury    Patient had stainless steel chart curtain rod fall on her head. This occurred at about the 10:00 in the morning. No loss of consciousness. Patient  ended up vomiting twice. No neck pain. Currently has some nausea and dizziness. Symptoms are consistent with head injury and probably concussion. CT of the head is negative for any acute injury also no evidence of any skull injury. Patient nontoxic no acute distress. Patient can be discharged home. Patient will take Tylenol and Zofran as needed. Patient has primary care doctor to follow-up with as needed. Patient had no neck pain. There were no other injuries.    Fredia Sorrow, MD 11/05/14 2220

## 2014-11-05 NOTE — ED Notes (Signed)
Pt reports she hit her head on a metal shower curtain this afternoon. States about an hour afterward she had an episode of vomiting. Denies any LOC. Reports a headache at this time

## 2014-11-05 NOTE — ED Provider Notes (Signed)
CSN: [id number]     Arrival date & time 11/05/14  1225 History   First MD Initiated Contact with Patient 11/05/14 1329     Chief Complaint  Patient presents with  . Head Injury   (Consider location/radiation/quality/duration/timing/severity/associated sxs/prior Treatment) HPI Comments: Patient states she was trying to give her puppy a bath at home in the tub this morning when shower bar fell and hit her on the top of her head. No LOC at time of injury. Took ibuprofen for pain and then about 2 hours after injury patient developed dizziness and vomiting. Vomited twice. Continues to feel unsteady with ambulation and has severe dizziness with changes in head position. Denies changes in vision. Now she states she feels excessively sleepy and remains dizzy. Does not take any anticoagulant medications  The history is provided by the patient and a relative.    Past Medical History  Diagnosis Date  . Hypertension   . Dyslipidemia   . Wears glasses   . Full dentures   . Tobacco use   . Hematuria     microscopic, several prior evaluations, no source or cause found  . H/O mammogram 2010  . H/O bone density study 8/14    never  . Influenza vaccine side effect     intolerance, declines  . Impaired fasting blood sugar 2008   Past Surgical History  Procedure Laterality Date  . Knee surgery      right  . Tonsillectomy    . Appendectomy  1980  . Spine surgery      lumbar  . Colonoscopy  8/14    never, declines  . Abdominal hysterectomy  1980    total; due to heavy bleeding  . Tubal ligation     Family History  Problem Relation Age of Onset  . Alzheimer's disease Mother   . Other Mother     died of UTI, dehydration  . Other Father 65    failure to thrive, old age, fall and rib fracture  . Diabetes Father   . Diabetes Sister   . Cancer Brother     brain, stomach   History  Substance Use Topics  . Smoking status: Current Every Day Smoker -- 0.50 packs/day for 40 years  .  Smokeless tobacco: Not on file  . Alcohol Use: No   OB History    No data available     Review of Systems  All other systems reviewed and are negative.   Allergies  Review of patient's allergies indicates no active allergies.  Home Medications   Prior to Admission medications   Medication Sig Start Date End Date Taking? Authorizing Provider  atorvastatin (LIPITOR) 20 MG tablet Take 1 tablet (20 mg total) by mouth daily. 10/05/14   Camelia Eng Tysinger, PA-C  lisinopril-hydrochlorothiazide (PRINZIDE,ZESTORETIC) 20-25 MG per tablet Take 1 tablet by mouth daily. 10/04/14   Camelia Eng Tysinger, PA-C  Multiple Vitamin (MULTIVITAMIN) capsule Take 1 capsule by mouth daily.    Historical Provider, MD  Omega-3 Fatty Acids (FISH OIL) 1000 MG CAPS Take by mouth.    Historical Provider, MD   BP 143/75 mmHg  Pulse 88  Temp(Src) 98 F (36.7 C) (Oral)  Resp 20  SpO2 98% Physical Exam  Constitutional: She is oriented to person, place, and time. Vital signs are normal. She appears well-developed and well-nourished. She is cooperative. No distress.  HENT:  Head: Normocephalic and atraumatic.    Right Ear: External ear normal.  Left Ear: External ear normal.  Nose: Nose normal.  Mouth/Throat: Oropharynx is clear and moist.  Eyes: Conjunctivae and EOM are normal. Pupils are equal, round, and reactive to light.  Neck: Normal range of motion and full passive range of motion without pain. Neck supple. No spinous process tenderness present.  Cardiovascular: Normal rate, regular rhythm and normal heart sounds.   Pulmonary/Chest: Effort normal and breath sounds normal.  Musculoskeletal: Normal range of motion.  Neurological: She is alert and oriented to person, place, and time. She has normal strength. No cranial nerve deficit or sensory deficit. Gait abnormal. Coordination normal. GCS eye subscore is 4. GCS verbal subscore is 5. GCS motor subscore is 6.  Mild ataxia with ambulation. Needs to hold on to  someone when walking  Skin: Skin is warm and dry.  Nursing note and vitals reviewed.   ED Course  Procedures (including critical care time) Labs Review Labs Reviewed - No data to display  Imaging Review No results found.   MDM   1. Concussion, without loss of consciousness, initial encounter   Daughter expresses concern about persistence of patient's symptoms and is concerned about intercranial injury. recommended that patien continue her evaluation in the emergency room upon being discharged from [facility]. Advised patient and family that she her evaluation indicate, brain imaging can be performed at ER.   Lutricia Feil, [place] 11/05/14 [phone]

## 2014-11-05 NOTE — Discharge Instructions (Signed)
Concussion A concussion is a brain injury. It is caused by:  A hit to the head.  A quick and sudden movement (jolt) of the head or neck. A concussion is usually not life threatening. Even so, it can cause serious problems. If you had a concussion before, you may have concussion-like problems after a hit to your head. HOME CARE General Instructions  Follow your doctor's directions carefully.  Take medicines only as told by your doctor.  Only take medicines your doctor says are safe.  Do not drink alcohol until your doctor says it is okay. Alcohol and some drugs can slow down healing. They can also put you at risk for further injury.  If you are having trouble remembering things, write them down.  Try to do one thing at a time if you get distracted easily. For example, do not watch TV while making dinner.  Talk to your family members or close friends when making important decisions.  Follow up with your doctor as told.  Watch your symptoms. Tell others to do the same. Serious problems can sometimes happen after a concussion. Older adults are more likely to have these problems.  Tell your teachers, school nurse, school counselor, coach, Product/process development scientist, or work Freight forwarder about your concussion. Tell them about what you can or cannot do. They should watch to see if:  It gets even harder for you to pay attention or concentrate.  It gets even harder for you to remember things or learn new things.  You need more time than normal to finish things.  You become annoyed (irritable) more than before.  You are not able to deal with stress as well.  You have more problems than before.  Rest. Make sure you:  Get plenty of sleep at night.  Go to sleep early.  Go to bed at the same time every day. Try to wake up at the same time.  Rest during the day.  Take naps when you feel tired.  Limit activities where you have to think a lot or concentrate. These include:  Doing  homework.  Doing work related to a job.  Watching TV.  Using the computer. Returning To Your Regular Activities Return to your normal activities slowly, not all at once. You must give your body and brain enough time to heal.   Do not play sports or do other athletic activities until your doctor says it is okay.  Ask your doctor when you can drive, ride a bicycle, or work other vehicles or machines. Never do these things if you feel dizzy.  Ask your doctor about when you can return to work or school. Preventing Another Concussion It is very important to avoid another brain injury, especially before you have healed. In rare cases, another injury can lead to permanent brain damage, brain swelling, or death. The risk of this is greatest during the first 7-10 days after your injury. Avoid injuries by:   Wearing a seat belt when riding in a car.  Not drinking too much alcohol.  Avoiding activities that could lead to a second concussion (such as contact sports).  Wearing a helmet when doing activities like:  Biking.  Skiing.  Skateboarding.  Skating.  Making your home safer by:  Removing things from the floor or stairways that could make you trip.  Using grab bars in bathrooms and handrails by stairs.  Placing non-slip mats on floors and in bathtubs.  Improve lighting in dark areas. GET HELP IF:  It  gets even harder for you to pay attention or concentrate.  It gets even harder for you to remember things or learn new things.  You need more time than normal to finish things.  You become annoyed (irritable) more than before.  You are not able to deal with stress as well.  You have more problems than before.  You have problems keeping your balance.  You are not able to react quickly when you should. Get help if you have any of these problems for more than 2 weeks:   Lasting (chronic) headaches.  Dizziness or trouble balancing.  Feeling sick to your stomach  (nausea).  Seeing (vision) problems.  Being affected by noises or light more than normal.  Feeling sad, low, down in the dumps, blue, gloomy, or empty (depressed).  Mood changes (mood swings).  Feeling of fear or nervousness about what may happen (anxiety).  Feeling annoyed.  Memory problems.  Problems concentrating or paying attention.  Sleep problems.  Feeling tired all the time. GET HELP RIGHT AWAY IF:   You have bad headaches or your headaches get worse.  You have weakness (even if it is in one hand, leg, or part of the face).  You have loss of feeling (numbness).  You feel off balance.  You keep throwing up (vomiting).  You feel tired.  One black center of your eye (pupil) is larger than the other.  You twitch or shake violently (convulse).  Your speech is not clear (slurred).  You are more confused, easily angered (agitated), or annoyed than before.  You have more trouble resting than before.  You are unable to recognize people or places.  You have neck pain.  It is difficult to wake you up.  You have unusual behavior changes.  You pass out (lose consciousness). MAKE SURE YOU:   Understand these instructions.  Will watch your condition.  Will get help right away if you are not doing well or get worse. Document Released: 08/14/2009 Document Revised: 01/10/2014 Document Reviewed: 03/18/2013 [facility] Patient Information 2015 [place], [place]. This information is not intended to replace advice given to you by your health care provider. Make sure you discuss any questions you have with your health care provider.  As we discussed restaurant brain is much as possible. Take Zofran as needed for any nausea or vomiting. Work note provided to be out of work until Tuesday. Return for any new or worse symptoms. Head CT was negative but symptoms are consistent with a concussion.

## 2014-11-05 NOTE — ED Notes (Signed)
Patient returned from CT

## 2014-11-05 NOTE — ED Notes (Signed)
States shower curtain fell on top of her head @ 1000.  No LOC.  Consc. And alert and ambulatory with dizziness.  States she vomited x 2.  No nausea now.  C/o h/a and R sided neck pain.

## 2014-11-07 ENCOUNTER — Telehealth: Payer: Self-pay | Admitting: Family Medicine

## 2014-11-07 NOTE — Telephone Encounter (Signed)
Called pt and advised she can use the Ibuprofen and she will come in on Thursday for follow up

## 2014-11-07 NOTE — Telephone Encounter (Signed)
Pt had CAT scan at ER which was negative for concussion.  She is having headaches and wants to know if ok to take Ibuprofen as Tylenol does not work for her.  Pt ph [phone]

## 2014-11-07 NOTE — Telephone Encounter (Signed)
Can use ibuprofen tonight and tomorrow morning, make f/u appt.   I reviewed ED records and CT scan

## 2014-11-09 ENCOUNTER — Ambulatory Visit (INDEPENDENT_AMBULATORY_CARE_PROVIDER_SITE_OTHER): Payer: Medicare Other | Admitting: Medical

## 2014-11-09 ENCOUNTER — Encounter: Payer: Self-pay | Admitting: Medical

## 2014-11-09 VITALS — BP 150/80 | HR 93 | Temp 98.3°F | Resp 15 | Wt 160.0 lb

## 2014-11-09 DIAGNOSIS — R110 Nausea: Secondary | ICD-10-CM | POA: Diagnosis not present

## 2014-11-09 DIAGNOSIS — S060X0D Concussion without loss of consciousness, subsequent encounter: Secondary | ICD-10-CM | POA: Diagnosis not present

## 2014-11-09 DIAGNOSIS — R42 Dizziness and giddiness: Secondary | ICD-10-CM

## 2014-11-09 DIAGNOSIS — G4489 Other headache syndrome: Secondary | ICD-10-CM

## 2014-11-09 MED ORDER — MECLIZINE HCL 25 MG PO TABS: 25.0000 mg | ORAL_TABLET | Freq: Two times a day (BID) | ORAL | Status: DC

## 2014-11-09 MED ORDER — HYDROCODONE-ACETAMINOPHEN 5-325 MG PO TABS: 1.0000 | ORAL_TABLET | Freq: Four times a day (QID) | ORAL | Status: DC | PRN

## 2014-11-09 NOTE — Progress Notes (Signed)
Subjective: She is here for emergency department follow-up for concussion.  Date of injury was 11/05/14.  The injury initially occurred when she was trying to give her dog a bath in the shower curtain fell on the back of her head.  After having dizziness and nausea and headache she was seen in the emergency department, had a normal CT scan, is given concussion precautions and advised to use Tylenol for headache.  Over the last few days she continues to have ringing in the ears, sore spot on the back of her head, ongoing headache behind her right eye.  No confusion, no more nausea or vomiting.  She ended up going back to work Monday which didn't help things.  She gest some relief when she gets home from work with rest, ice pack on scalp and ibuprofen.  No other aggravating or relieving factors. No other complaint.  ROS as in subjective  Objective: BP 150/80 mmHg  Pulse 93  Temp(Src) 98.3 F (36.8 C) (Oral)  Resp 15  Wt 160 lb (72.576 kg)  General appearence: alert, no distress, WD/WN HEENT: normocephalic, sclerae anicteric, PERRLA, EOMi, nares patent, no discharge or erythema, pharynx normal Oral cavity: MMM, no lesions Neck: supple, no lymphadenopathy, no thyromegaly, no masses Heart: RRR, normal S1, S2, no murmurs Lungs: CTA bilaterally, no wheezes, rhonchi, or rales Extremities: no edema, no cyanosis, no clubbing Pulses: 2+ symmetric, upper and lower extremities, normal cap refill Neurological: sways with romberg, off balance with heel to toe, otherwise alert, oriented x 3, CN2-12 intact, strength normal upper extremities and lower extremities, sensation normal throughout, DTRs 2+ throughout, gait normal Psychiatric: normal affect, behavior normal, pleasant   Assessment: Encounter Diagnoses  Name Primary?  . Concussion, without loss of consciousness, subsequent encounter Yes  . Dizziness and giddiness   . Nausea without vomiting   . Headache syndrome     Plan: reviewed recent ED  notes and CT scan.   Discussed diangosis of concussion, concussion symptoms, treatment recommendations.  Scripts for Meclizine and Hydrocodone prn, but can use Ibuprofen for headache for less headaches.  Discussed need for complete rest given ongoing concussion symptoms.  Gave note out of work the next 2-3 days, then 4 hour days for 3 additional days.   This is assuming headaches and symptoms gradually improved daily the next few days.   If not improving or worse, then return.  Call report in 2-3 days.

## 2014-11-10 ENCOUNTER — Ambulatory Visit: Payer: Medicare Other | Admitting: Medical

## 2015-02-03 ENCOUNTER — Telehealth: Payer: Self-pay | Admitting: Medical

## 2015-02-03 MED ORDER — LISINOPRIL-HYDROCHLOROTHIAZIDE 20-25 MG PO TABS: 1.0000 | ORAL_TABLET | Freq: Every day | ORAL | Status: DC

## 2015-02-03 NOTE — Telephone Encounter (Signed)
Done

## 2015-02-03 NOTE — Telephone Encounter (Signed)
Pt needs refill on Lisinopril/HCTZ to [facility]

## 2015-03-06 ENCOUNTER — Other Ambulatory Visit: Payer: Self-pay

## 2015-05-08 ENCOUNTER — Ambulatory Visit (INDEPENDENT_AMBULATORY_CARE_PROVIDER_SITE_OTHER): Payer: Medicare Other | Admitting: Family Medicine

## 2015-05-08 ENCOUNTER — Encounter: Payer: Self-pay | Admitting: Family Medicine

## 2015-05-08 VITALS — BP 130/82 | HR 76 | Temp 98.2°F | Wt 163.0 lb

## 2015-05-08 DIAGNOSIS — Z720 Tobacco use: Secondary | ICD-10-CM | POA: Diagnosis not present

## 2015-05-08 DIAGNOSIS — F17200 Nicotine dependence, unspecified, uncomplicated: Secondary | ICD-10-CM

## 2015-05-08 DIAGNOSIS — M5442 Lumbago with sciatica, left side: Secondary | ICD-10-CM | POA: Diagnosis not present

## 2015-05-08 MED ORDER — CYCLOBENZAPRINE HCL 5 MG PO TABS: 5.0000 mg | ORAL_TABLET | Freq: Three times a day (TID) | ORAL | Status: DC | PRN

## 2015-05-08 NOTE — Progress Notes (Signed)
   Subjective:    Patient ID: Alyssa Carter, female    DOB: 1945-10-31, 70 y.o.   MRN: [id number]  HPI She is here for a 3 day history of left low back pain that she describes as sharp, stabbing and in radiates into her left buttock. She reports doing yard work Friday evening, mowing with push mower and lifting brush. She states pain was present Saturday morning upon awakening and gradually got worse Sunday and today. 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 Reports some spasm type sensation when going from sitting to standing but once walking she is fine. She states she has tried heat and icy hot along with ibuprofen 600mg  yesterday and today every 6 hours. She denies fever, chills, numbness, tingling, weakness, loss of control of bowels or bladder.     Review of Systems Pertinent positives and negatives in the history of present illness.    Objective:   Physical Exam  Alert and in no distress. No asymmetry to hips or back, no rash, erythema, or bruising to low back or buttock. Normal sensation, no tenderness, pain with flexion and right lateral bending. Patellar and achilles DTRs normal.  Negative straight leg test.     Assessment & Plan:   Left-sided low back pain with left-sided sciatica  Tobacco use disorder  Discussed that she should try using heat 20 minutes 3 times a day and then do some light stretching like I demonstrated in the office. Recommended she take ibuprofen 600 mg no more than every 8 hours. She may use the Flexeril if not getting pain relief. She will let us know if she is not feeling better by Thursday. Provided a note to be out of work tomorrow. Discussed that she should continue using good body mechanics when lifting at work or home. Also encouraged her to let us know if she develops numbness, tingling, or weakness to her lower extremities. Discussed that she  would benefit from stopping smoking, she is not ready to do this.

## 2015-05-08 NOTE — Patient Instructions (Addendum)
Try using heat 20 minutes 3 times a day and then do some light stretching like I demonstrated. Take ibuprofen 600 mg no more than every 8 hours. Use the Flexeril if you're not getting pain relief. Let us know if not feeling better by Thursday. We will give you a note to be out of work tomorrow. Continue using good body mechanics when your are lifting at work or home. Let us know if you develop numbness, tingling, or weakness in your lower extremities.

## 2015-05-10 ENCOUNTER — Telehealth: Payer: Self-pay | Admitting: Family Medicine

## 2015-05-10 NOTE — Telephone Encounter (Signed)
Pt called and stated she is still having issues. She states that the flexeril is not helping that much. She is having trouble sleeping. She wanted to know if she could take Tylenol PM with flexeril or if she could have something else. Pt can be reached at [phone] and uses [facility] on [address].

## 2015-05-10 NOTE — Telephone Encounter (Signed)
Please call Alyssa Carter and ask her if back pain is worse or just not any better yet? Has she been resting her back and heating and stretching?  We really haven't given the medications and treatment plan we discussed on Monday afternoon enough time to kick in. Ask her if she is ok going for an XR of her low back and sacral area and if so we will order this. She may try taking 2 of her Flexeril 10mg  and see if that helps if she is not going to work or driving.

## 2015-05-10 NOTE — Telephone Encounter (Signed)
Pt states she is not getting any better but not getting any worse. She is still trying heat 20 mins and will take 2 flexeril a day and then give [place] a call back in the next couple days and see if a xray is needed

## 2015-05-22 ENCOUNTER — Ambulatory Visit (INDEPENDENT_AMBULATORY_CARE_PROVIDER_SITE_OTHER): Payer: Medicare Other | Admitting: Medical

## 2015-05-22 ENCOUNTER — Encounter: Payer: Self-pay | Admitting: Medical

## 2015-05-22 VITALS — BP 130/70 | HR 90 | Temp 98.1°F | Resp 18 | Wt 163.4 lb

## 2015-05-22 DIAGNOSIS — I10 Essential (primary) hypertension: Secondary | ICD-10-CM | POA: Diagnosis not present

## 2015-05-22 DIAGNOSIS — Z7189 Other specified counseling: Secondary | ICD-10-CM | POA: Diagnosis not present

## 2015-05-22 DIAGNOSIS — E782 Mixed hyperlipidemia: Secondary | ICD-10-CM | POA: Diagnosis not present

## 2015-05-22 DIAGNOSIS — Z9889 Other specified postprocedural states: Secondary | ICD-10-CM | POA: Diagnosis not present

## 2015-05-22 DIAGNOSIS — Z720 Tobacco use: Secondary | ICD-10-CM

## 2015-05-22 DIAGNOSIS — Z129 Encounter for screening for malignant neoplasm, site unspecified: Secondary | ICD-10-CM

## 2015-05-22 DIAGNOSIS — Z7185 Encounter for immunization safety counseling: Secondary | ICD-10-CM

## 2015-05-22 DIAGNOSIS — R209 Unspecified disturbances of skin sensation: Secondary | ICD-10-CM | POA: Diagnosis not present

## 2015-05-22 DIAGNOSIS — F17200 Nicotine dependence, unspecified, uncomplicated: Secondary | ICD-10-CM | POA: Insufficient documentation

## 2015-05-22 DIAGNOSIS — R202 Paresthesia of skin: Secondary | ICD-10-CM | POA: Insufficient documentation

## 2015-05-22 DIAGNOSIS — Z1159 Encounter for screening for other viral diseases: Secondary | ICD-10-CM | POA: Insufficient documentation

## 2015-05-22 LAB — COMPREHENSIVE METABOLIC PANEL
ALK PHOS: 68 U/L (ref 33–130)
ALT: 23 U/L (ref 6–29)
AST: 18 U/L (ref 10–35)
Albumin: 4.3 g/dL (ref 3.6–5.1)
BUN: 23 mg/dL (ref 7–25)
CO2: 28 mmol/L (ref 20–31)
CREATININE: 0.92 mg/dL (ref 0.50–0.99)
Calcium: 10.1 mg/dL (ref 8.6–10.4)
Chloride: 100 mmol/L (ref 98–110)
GLUCOSE: 96 mg/dL (ref 65–99)
POTASSIUM: 4.7 mmol/L (ref 3.5–5.3)
SODIUM: 136 mmol/L (ref 135–146)
Total Bilirubin: 0.4 mg/dL (ref 0.2–1.2)
Total Protein: 6.7 g/dL (ref 6.1–8.1)

## 2015-05-22 LAB — LIPID PANEL
CHOL/HDL RATIO: 9.2 ratio — AB (ref <=5.0)
Cholesterol: 267 mg/dL — ABNORMAL HIGH (ref 125–200)
HDL: 29 mg/dL — ABNORMAL LOW (ref >=46)
Triglycerides: 566 mg/dL — ABNORMAL HIGH (ref <150)

## 2015-05-22 LAB — CBC
HCT: 38.3 % (ref 36.0–46.0)
Hemoglobin: 12.8 g/dL (ref 12.0–15.0)
MCH: 31.1 pg (ref 26.0–34.0)
MCHC: 33.4 g/dL (ref 30.0–36.0)
MCV: 93.2 fL (ref 78.0–100.0)
MPV: 9.4 fL (ref 8.6–12.4)
PLATELETS: 367 10*3/uL (ref 150–400)
RBC: 4.11 MIL/uL (ref 3.87–5.11)
RDW: 13.5 % (ref 11.5–15.5)
WBC: 6.9 10*3/uL (ref 4.0–10.5)

## 2015-05-22 LAB — VITAMIN B12: Vitamin B-12: 1018 pg/mL — ABNORMAL HIGH (ref 211–911)

## 2015-05-22 LAB — TSH: TSH: 1.364 u[IU]/mL (ref 0.350–4.500)

## 2015-05-22 NOTE — Progress Notes (Signed)
Subjective:   Alyssa Carter is a 69 y.o. female presenting on 05/22/2015 with BP check & med refill  Hypertension - Here for follow-up of hypertension.  She is exercising some,   She is adherent to a low-salt diet.  Blood pressure is well controlled at home.  Cardiac symptoms: claudication. Patient denies: chest pain, chest pressure/discomfort, dyspnea, exertional chest pressure/discomfort, fatigue, irregular heart beat and lower extremity edema. Cardiovascular risk factors: advanced age (older than 61 for men, 26 for women), dyslipidemia, hypertension and smoking/ tobacco exposure. Use of agents associated with hypertension: none.   Abnormal lipids - Patient is here for follow up of abnormal lipids.  Compliance with treatment has been good.  Patient denies muscle pain associated with their medications.    She c/t to smoke.  No other aggravating or relieving factors.  No other complaint.  Review of Systems ROS as in subjective   Objective: Filed Vitals:   05/22/15 1538  BP: 130/70  Pulse: 90  Temp: 98.1 F (36.7 C)  Resp: 18    General appearance: alert, no distress, WD/WN Oral cavity: MMM, no lesions Neck: supple, no lymphadenopathy, no thyromegaly, no masses, no bruits Heart: RRR, normal S1, S2, no murmurs Lungs: CTA bilaterally, no wheezes, rhonchi, or rales Abdomen: +bs, soft, non tender, non distended, no masses, no hepatomegaly, no splenomegaly, no bruits Pulses: 1+ symmetric, upper and lower extremities, normal cap refill Neuro: decreased monofilament exam and decreased sensation of feet in stocking pattern, otherwise neuro non focal     Assessment: Encounter Diagnoses  Name Primary?  . Essential hypertension Yes  . Paresthesia of both lower extremities   . Mixed dyslipidemia   . Smoker   . Tobacco use disorder   . History of back surgery   . Screening for cancer   . Vaccine counseling      Plan: HTN - c/t current medications Lisinopril HCT 20/25 mg  daily  Paresthesias - likely due to lumbar radiculopathy, but consider ABIs for possible peripheral vascular disease  Mixed dyslipidemia - labs today, c/t same medication Lipitor 20mg  daily and ASA 81mg  daily  Smoker - discussed risks of tobacco, encouraged cessation, discussed cardiac risk factors and prevention  Hx/o back surgery  Screening for cancer - advised she check insurance coverage for screening lung CT due to 40+year smoking hx/o  She will get flu and pneumonia vaccines at pharmacy for free  Alyssa Carter was seen today for bp check & med refill.  Diagnoses and all orders for this visit:  Essential hypertension -     Comprehensive metabolic panel -     Lipid panel -     CBC -     TSH -     Vitamin B12  Paresthesia of both lower extremities -     Comprehensive metabolic panel -     Lipid panel -     CBC -     TSH -     Vitamin B12  Mixed dyslipidemia -     Comprehensive metabolic panel -     Lipid panel -     CBC -     TSH -     Vitamin B12  Smoker -     Comprehensive metabolic panel -     Lipid panel -     CBC -     TSH -     Vitamin B12  Tobacco use disorder  History of back surgery  Screening for cancer  Vaccine counseling  Return pending labs.

## 2015-05-23 ENCOUNTER — Telehealth: Payer: Self-pay | Admitting: Medical

## 2015-05-23 ENCOUNTER — Other Ambulatory Visit: Payer: Self-pay | Admitting: Medical

## 2015-05-23 MED ORDER — ROSUVASTATIN CALCIUM 40 MG PO TABS: 40.0000 mg | ORAL_TABLET | Freq: Every day | ORAL | Status: DC

## 2015-05-23 MED ORDER — ICOSAPENT ETHYL 1 G PO CAPS: 2.0000 | ORAL_CAPSULE | Freq: Two times a day (BID) | ORAL | Status: DC

## 2015-05-23 MED ORDER — LISINOPRIL-HYDROCHLOROTHIAZIDE 20-25 MG PO TABS: 1.0000 | ORAL_TABLET | Freq: Every day | ORAL | Status: DC

## 2015-05-23 NOTE — Telephone Encounter (Signed)
Pt called and wanted to remind you to send in her blood pressure med [facility] [place] [employer],

## 2015-05-24 ENCOUNTER — Telehealth: Payer: Self-pay | Admitting: Medical

## 2015-05-24 NOTE — Telephone Encounter (Signed)
Pt states that she will not be picking up her Crestor and Lcosapent meds because they cost well over $300 dollars. She will get the Lisinopril and take the cholesterol med that she already has but wants [name] to know that she will need something else to replace those meds

## 2015-05-24 NOTE — Telephone Encounter (Signed)
I will have Mickel Baas check into prior auth as they were listed as the cheaper option in our electronic system which is not always accurate.   Have her C/t what she is doing, but we will work to get Crestor and Vascepa approved as we need to find something more effective at lowering her cholesterol.

## 2015-06-02 NOTE — Telephone Encounter (Signed)
Have tried to call pharmacy [facility] [phone] several different times and line keeps staying busy

## 2015-06-02 NOTE — Telephone Encounter (Signed)
Called pharmacy back and states no Prior Authorization is required.  Went thru ins but cost for Dalene Seltzer is $256 and generic Crestor $124, insurance is with [employer] [id number]

## 2015-06-02 NOTE — Telephone Encounter (Signed)
Pt informed, she will pick up the generic Crestor from [facility]

## 2015-06-02 NOTE — Telephone Encounter (Signed)
I checked and No patient assistance programs are available or discount cards with Medicare.  I searched online and got coupon for [facility] at [facility] and called Rx into there and cost for #30 40mg  is $20.30.  Vascepa is still expensive with GoodRx $250.

## 2015-06-05 DIAGNOSIS — Z23 Encounter for immunization: Secondary | ICD-10-CM | POA: Diagnosis not present

## 2015-06-06 ENCOUNTER — Encounter: Payer: Self-pay | Admitting: Family Medicine

## 2015-06-12 ENCOUNTER — Other Ambulatory Visit: Payer: Self-pay | Admitting: Medical

## 2015-06-12 ENCOUNTER — Encounter: Payer: Self-pay | Admitting: Medical

## 2015-06-12 MED ORDER — OMEGA-3-ACID ETHYL ESTERS 1 G PO CAPS: 2.0000 g | ORAL_CAPSULE | Freq: Two times a day (BID) | ORAL | Status: DC

## 2015-06-12 NOTE — Telephone Encounter (Signed)
Please mail her the letter I typed since she hung up on you.

## 2015-06-12 NOTE — Telephone Encounter (Signed)
Called to tell pt about the new medication since the old was to expensive. She said she is taking OTC fish oil and doesn't see why she needs to take this one. Then she hung up on me.

## 2015-06-12 NOTE — Telephone Encounter (Signed)
Instead of Vascepa since it was too expensive, I sent Lovaza prescription fish oil 2 capsules BID.   Call back if this also too expensive.   Have her do the generic Crestor since she agreed to this.   If no issue with lovaza, then plan to recheck fasting in 62mo regarding lipids.   Have her call back in the meantime if additional issues with costs.

## 2015-06-13 NOTE — Telephone Encounter (Signed)
Letter printed and mailed to pt.

## 2015-11-27 IMAGING — CT CT HEAD W/O CM
2 series · 16 of 30 positions shown, 18 images · non-contrast
Comparison: MRI brain dated 10/04/2003

CLINICAL DATA: Right head injury, headache, dizziness, vomiting

EXAM:
CT HEAD WITHOUT CONTRAST
TECHNIQUE: Contiguous axial images were obtained from the base of the skull
through the vertex without intravenous contrast.

[Series 201: head w/o, idose (1) · axial · non-contrast · 0.42mm/px · z∈[+116,+221]mm · 8 of 28 slices shown, 10 images]
[im 4/28  brain]
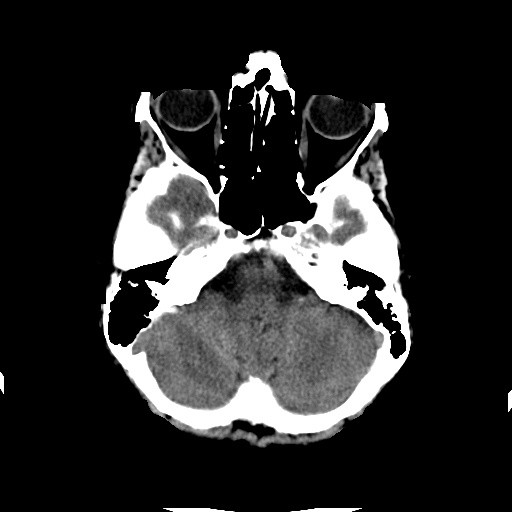
[im 4/28  bone]
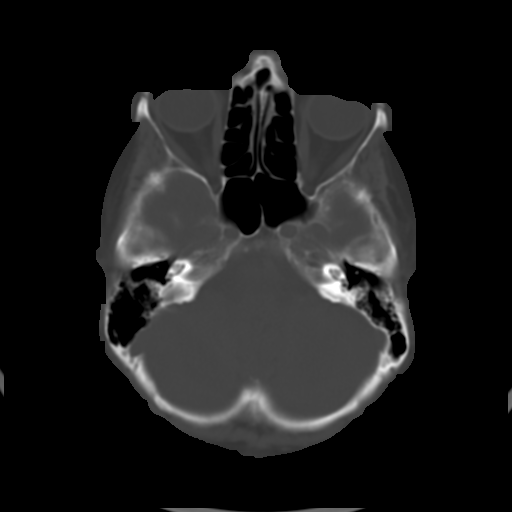
[im 7/28  brain]
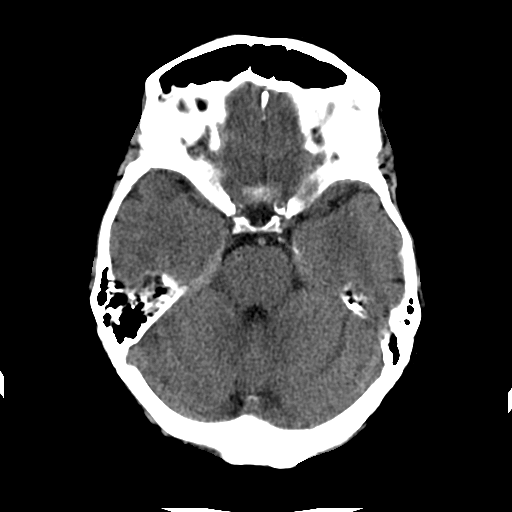
[im 10/28  brain]
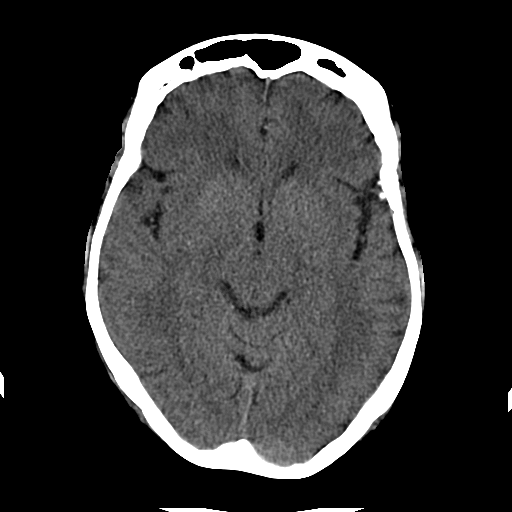
[im 13/28  brain]
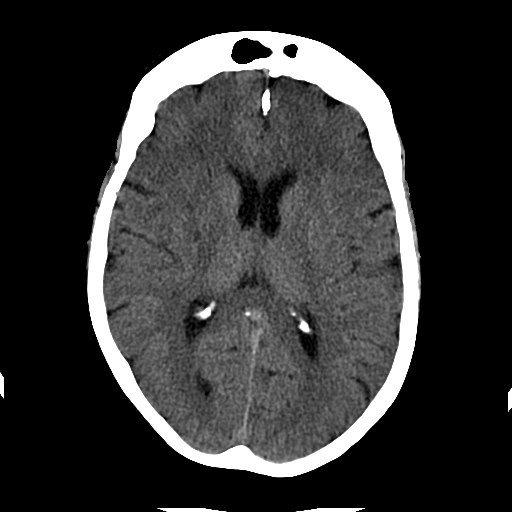
[im 16/28  brain]
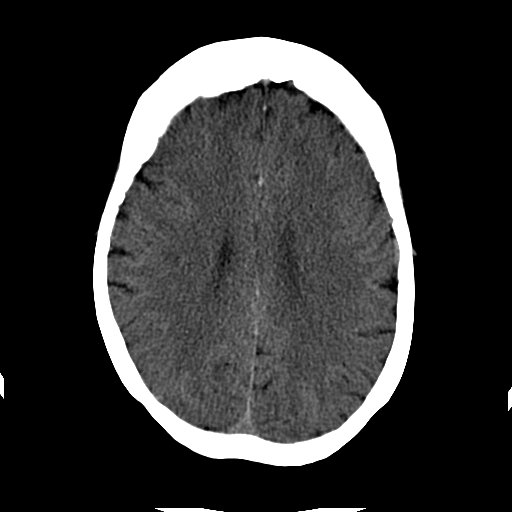
[im 16/28  bone]
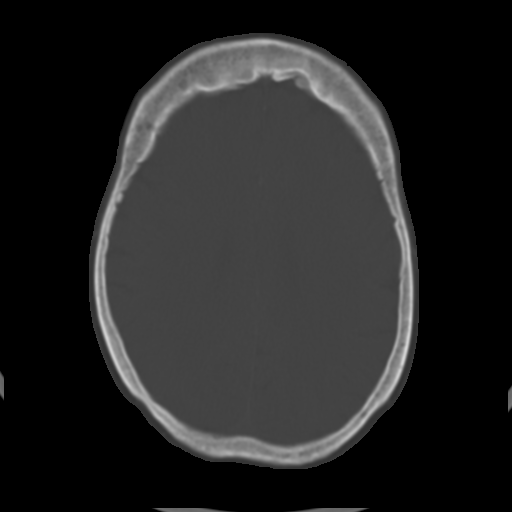
[im 19/28  brain]
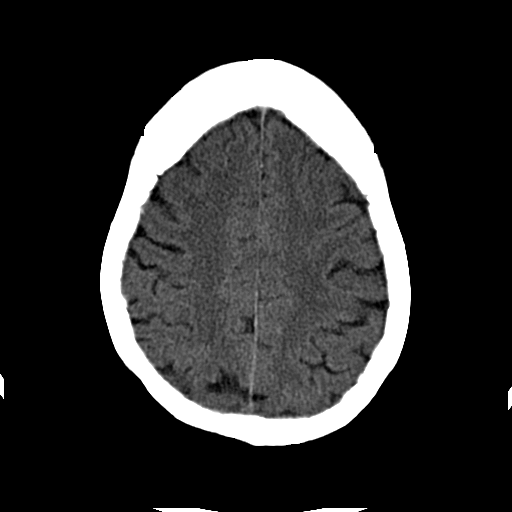
[im 22/28  brain]
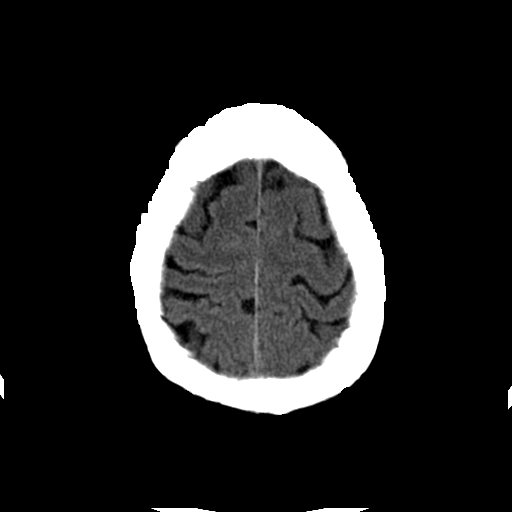
[im 25/28  brain]
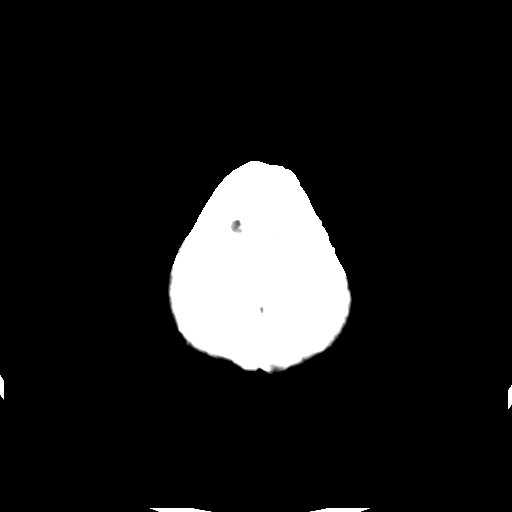

[Series 202: head w/o bone, idose (1) · axial · non-contrast · 0.42mm/px · z∈[+112,+222]mm · 8 of 56 slices shown]
[im 6/56  bone]
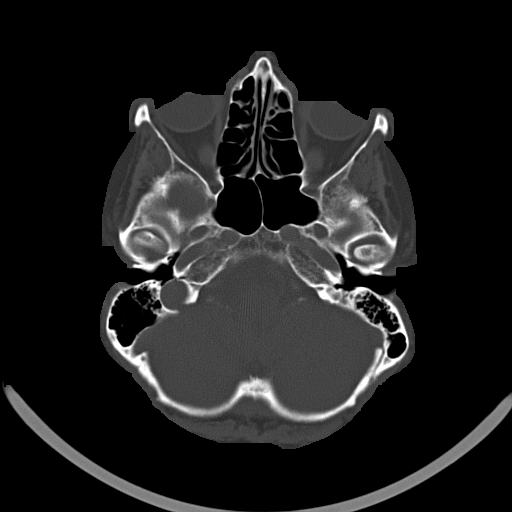
[im 12/56  bone]
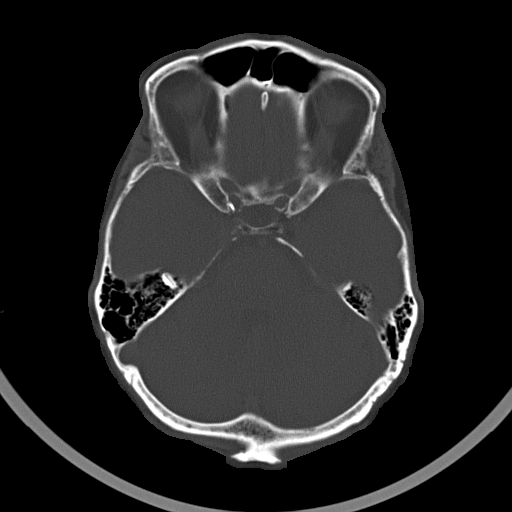
[im 18/56  bone]
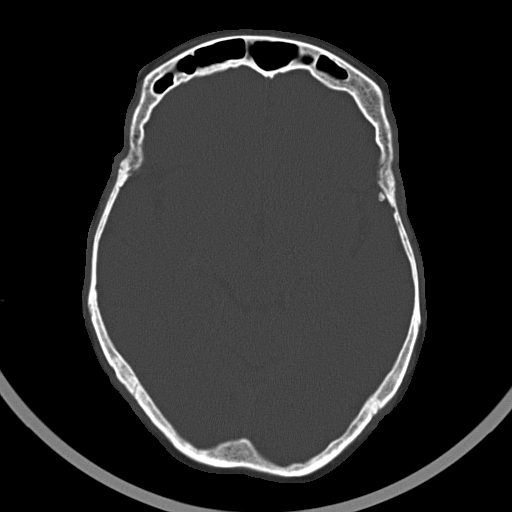
[im 24/56  bone]
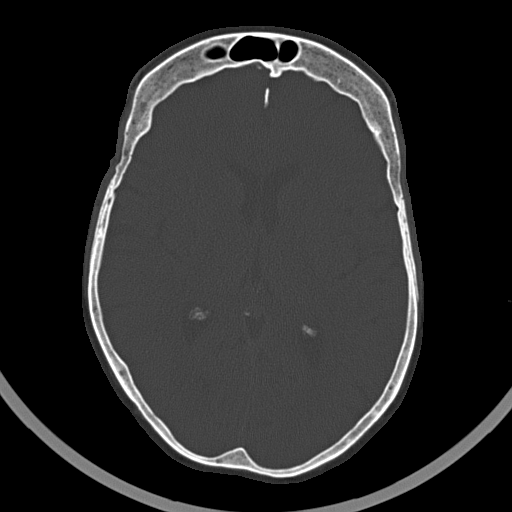
[im 32/56  bone]
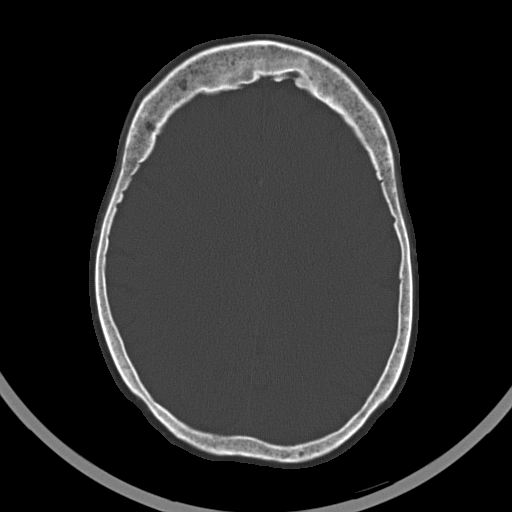
[im 38/56  bone]
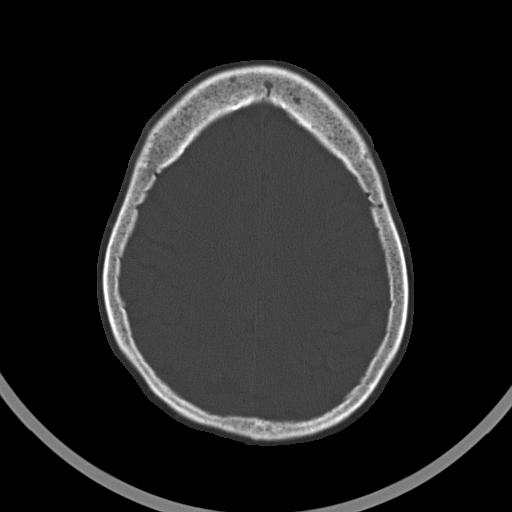
[im 44/56  bone]
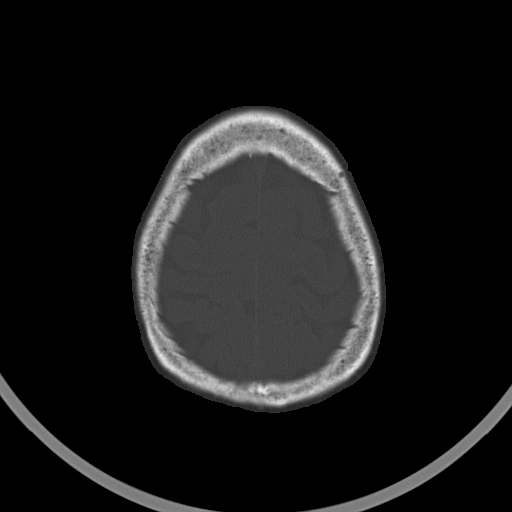
[im 50/56  bone]
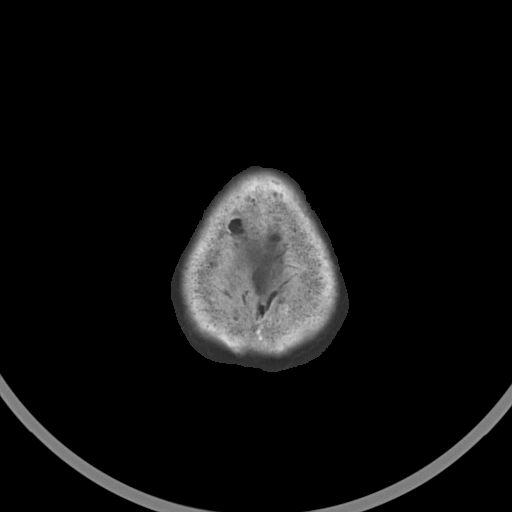

[16 of 30 positions shown; findings below may reference images not displayed]

FINDINGS: No evidence of parenchymal hemorrhage or extra-axial fluid
collection. No mass lesion, mass effect, or midline shift.

No CT evidence of acute infarction.

Subcortical white matter and periventricular small vessel ischemic
changes.

Cerebral volume is within normal limits.  No ventriculomegaly.

The visualized paranasal sinuses are essentially clear. The mastoid
air cells are unopacified.

No evidence of calvarial fracture.
IMPRESSION: No evidence of acute intracranial abnormality.

Small vessel ischemic changes.

## 2015-12-17 ENCOUNTER — Other Ambulatory Visit: Payer: Self-pay | Admitting: Medical

## 2015-12-26 ENCOUNTER — Ambulatory Visit (INDEPENDENT_AMBULATORY_CARE_PROVIDER_SITE_OTHER): Payer: Medicare Other | Admitting: Medical

## 2015-12-26 ENCOUNTER — Encounter: Payer: Self-pay | Admitting: Medical

## 2015-12-26 VITALS — BP 120/76 | HR 74 | Wt 167.0 lb

## 2015-12-26 DIAGNOSIS — I10 Essential (primary) hypertension: Secondary | ICD-10-CM | POA: Diagnosis not present

## 2015-12-26 DIAGNOSIS — Z129 Encounter for screening for malignant neoplasm, site unspecified: Secondary | ICD-10-CM

## 2015-12-26 DIAGNOSIS — R202 Paresthesia of skin: Secondary | ICD-10-CM | POA: Diagnosis not present

## 2015-12-26 DIAGNOSIS — F17200 Nicotine dependence, unspecified, uncomplicated: Secondary | ICD-10-CM

## 2015-12-26 DIAGNOSIS — E782 Mixed hyperlipidemia: Secondary | ICD-10-CM

## 2015-12-26 DIAGNOSIS — R7301 Impaired fasting glucose: Secondary | ICD-10-CM | POA: Diagnosis not present

## 2015-12-26 NOTE — Patient Instructions (Addendum)
Encounter Diagnoses  Name Primary?  . Essential hypertension Yes  . Tobacco use disorder   . Mixed dyslipidemia   . Paresthesia of both lower extremities   . Screening for cancer   . Impaired fasting blood sugar    Recommendations:  We are referring you to have blood flow screen done on the legs  We can consider nerve testing of your legs as well  We are referring you to do Cologuard stool screen for colon cancer  We will call with lab results and plan  Continue your current medicaitons   Exercise regularly  In addition to taking a multivitamin daily, I recommend you take Vitamin D 1000 IU daily for bone health

## 2015-12-26 NOTE — Addendum Note (Signed)
Addended by: Billie Lade on: 12/26/2015 11:33 AM   Modules accepted: Orders

## 2015-12-26 NOTE — Progress Notes (Signed)
   Subjective:   Alyssa Carter is a 70 y.o. female presenting on 12/26/2015 with med check  Hypertension - Here for follow-up of hypertension.  She is exercising some,   She is adherent to a low-salt diet.  Blood pressure is well controlled at home.  Cardiac symptoms: claudication.?  Gets numbness in feet ongoing.  Patient denies: chest pain, chest pressure/discomfort, dyspnea, exertional chest pressure/discomfort, fatigue, irregular heart beat and lower extremity edema. Cardiovascular risk factors: advanced age (older than 62 for men, 49 for women), dyslipidemia, hypertension and smoking/ tobacco exposure. Use of agents associated with hypertension: none.   Abnormal lipids - Patient is here for follow up of abnormal lipids.  After last visit we changed to Crestor and she has been compliant with this.  Lovaza and Vascepa were too expensive so she is using OTC Fish oil .  Still smokes thought.   No other aggravating or relieving factors.  No other complaint.  Review of Systems ROS as in subjective   Objective: BP 120/76 mmHg  Pulse 74  Wt 167 lb (75.751 kg)  General appearance: alert, no distress, WD/WN Oral cavity: MMM, no lesions Neck: supple, no lymphadenopathy, no thyromegaly, no masses, no bruits Heart: RRR, normal S1, S2, no murmurs Lungs: CTA bilaterally, no wheezes, rhonchi, or rales Abdomen: +bs, soft, non tender, non distended, no masses, no hepatomegaly, no splenomegaly, no bruits Pulses: 1+ symmetric, upper and lower extremities, normal cap refill Neuro: decreased monofilament exam and decreased sensation of feet in stocking pattern, otherwise neuro non focal     Assessment: Encounter Diagnoses  Name Primary?  . Essential hypertension Yes  . Tobacco use disorder   . Mixed dyslipidemia   . Paresthesia of both lower extremities   . Screening for cancer   . Impaired fasting blood sugar      Plan: HTN - c/t current medications Lisinopril HCT 20/25 mg  daily  Paresthesias - likely due to lumbar radiculopathy, but consider ABIs for possible peripheral vascular disease  Mixed dyslipidemia - labs today, c/t Crestor 40mg , fish oil, and ASA 81mg  daily  Smoker - discussed risks of tobacco, encouraged cessation, discussed cardiac risk factors and prevention  Hx/o back surgery  Screening for cancer - referral for Cologuard  She declines pneumonia vaccine.  Alyssa Carter was seen today for med check.  Diagnoses and all orders for this visit:  Essential hypertension -     Comprehensive metabolic panel -     Lipid panel -     Hemoglobin A1c -     CBC  Tobacco use disorder -     Comprehensive metabolic panel -     Lipid panel -     Hemoglobin A1c -     CBC  Mixed dyslipidemia -     Comprehensive metabolic panel -     Lipid panel -     Hemoglobin A1c -     CBC  Paresthesia of both lower extremities  Screening for cancer  Impaired fasting blood sugar -     Hemoglobin A1c   Return pending labs, studies.

## 2015-12-27 ENCOUNTER — Other Ambulatory Visit: Payer: Self-pay | Admitting: Medical

## 2015-12-27 DIAGNOSIS — R202 Paresthesia of skin: Secondary | ICD-10-CM

## 2015-12-27 LAB — CBC
HCT: 40.7 % (ref 35.0–45.0)
HEMOGLOBIN: 13.5 g/dL (ref 11.7–15.5)
MCH: 31.5 pg (ref 27.0–33.0)
MCHC: 33.2 g/dL (ref 32.0–36.0)
MCV: 95.1 fL (ref 80.0–100.0)
MPV: 9 fL (ref 7.5–12.5)
PLATELETS: 307 10*3/uL (ref 140–400)
RBC: 4.28 MIL/uL (ref 3.80–5.10)
RDW: 13.2 % (ref 11.0–15.0)
WBC: 6.9 10*3/uL (ref 4.0–10.5)

## 2015-12-27 LAB — LIPID PANEL
Cholesterol: 125 mg/dL (ref 125–200)
HDL: 39 mg/dL — ABNORMAL LOW (ref >=46)
LDL Cholesterol: 43 mg/dL (ref <130)
Total CHOL/HDL Ratio: 3.2 Ratio (ref <=5.0)
Triglycerides: 214 mg/dL — ABNORMAL HIGH (ref <150)
VLDL: 43 mg/dL — ABNORMAL HIGH (ref <30)

## 2015-12-27 LAB — COMPREHENSIVE METABOLIC PANEL
ALK PHOS: 53 U/L (ref 33–130)
ALT: 26 U/L (ref 6–29)
AST: 20 U/L (ref 10–35)
Albumin: 4.5 g/dL (ref 3.6–5.1)
BUN: 17 mg/dL (ref 7–25)
CO2: 26 mmol/L (ref 20–31)
Calcium: 9.7 mg/dL (ref 8.6–10.4)
Chloride: 103 mmol/L (ref 98–110)
Creat: 0.7 mg/dL (ref 0.50–0.99)
GLUCOSE: 90 mg/dL (ref 65–99)
Potassium: 4.1 mmol/L (ref 3.5–5.3)
Sodium: 139 mmol/L (ref 135–146)
Total Bilirubin: 0.5 mg/dL (ref 0.2–1.2)
Total Protein: 6.9 g/dL (ref 6.1–8.1)

## 2015-12-27 LAB — HEMOGLOBIN A1C
HEMOGLOBIN A1C: 5.9 % — AB (ref <5.7)
Mean Plasma Glucose: 123 mg/dL

## 2015-12-27 MED ORDER — ROSUVASTATIN CALCIUM 40 MG PO TABS: 40.0000 mg | ORAL_TABLET | Freq: Every day | ORAL | Status: DC

## 2015-12-27 MED ORDER — LISINOPRIL-HYDROCHLOROTHIAZIDE 20-25 MG PO TABS: 1.0000 | ORAL_TABLET | Freq: Every day | ORAL | Status: DC

## 2015-12-27 MED ORDER — ASPIRIN EC 81 MG PO TBEC: 81.0000 mg | DELAYED_RELEASE_TABLET | Freq: Every day | ORAL | Status: DC

## 2015-12-29 ENCOUNTER — Telehealth: Payer: Self-pay | Admitting: Medical

## 2015-12-29 MED ORDER — LISINOPRIL-HYDROCHLOROTHIAZIDE 20-25 MG PO TABS: 1.0000 | ORAL_TABLET | Freq: Every day | ORAL | Status: DC

## 2015-12-29 NOTE — Telephone Encounter (Signed)
Pt requested that Lisinopril be sent to [facility]. She can get it cheaper at [facility]

## 2015-12-29 NOTE — Telephone Encounter (Signed)
done

## 2015-12-31 DIAGNOSIS — Z1211 Encounter for screening for malignant neoplasm of colon: Secondary | ICD-10-CM | POA: Diagnosis not present

## 2015-12-31 DIAGNOSIS — Z1212 Encounter for screening for malignant neoplasm of rectum: Secondary | ICD-10-CM | POA: Diagnosis not present

## 2016-01-03 ENCOUNTER — Ambulatory Visit (HOSPITAL_COMMUNITY)
Admission: RE | Admit: 2016-01-03 | Discharge: 2016-01-03 | Disposition: A | Discharge status: Home / Self Care | Payer: Medicare Other | Source: Ambulatory Visit | Attending: Medical | Admitting: Medical

## 2016-01-03 DIAGNOSIS — I10 Essential (primary) hypertension: Secondary | ICD-10-CM | POA: Diagnosis not present

## 2016-01-03 DIAGNOSIS — R938 Abnormal findings on diagnostic imaging of other specified body structures: Secondary | ICD-10-CM | POA: Insufficient documentation

## 2016-01-03 DIAGNOSIS — I771 Stricture of artery: Secondary | ICD-10-CM | POA: Insufficient documentation

## 2016-01-03 DIAGNOSIS — Z720 Tobacco use: Secondary | ICD-10-CM | POA: Diagnosis not present

## 2016-01-03 DIAGNOSIS — R202 Paresthesia of skin: Secondary | ICD-10-CM | POA: Insufficient documentation

## 2016-01-03 DIAGNOSIS — E785 Hyperlipidemia, unspecified: Secondary | ICD-10-CM | POA: Diagnosis not present

## 2016-01-12 ENCOUNTER — Telehealth: Payer: Self-pay | Admitting: Medical

## 2016-01-12 ENCOUNTER — Encounter: Payer: Self-pay | Admitting: Vascular Surgery

## 2016-01-12 NOTE — Telephone Encounter (Signed)
Pt said she will think about it and call us back. I told her what it could mean and that it was strongly recommended

## 2016-01-12 NOTE — Telephone Encounter (Signed)
unfortunately [place] guard screen was +. This could mean blood or other worrisome findings.  Given the + result, I certainly recommend diagnostic colonoscopy to rule out worrisome causes.   Please refer to GI.   I appreciate her doing the Cologuard, but since its positive, I want to make sure we take the next step.

## 2016-01-17 ENCOUNTER — Ambulatory Visit (INDEPENDENT_AMBULATORY_CARE_PROVIDER_SITE_OTHER): Payer: Medicare Other | Admitting: Vascular Surgery

## 2016-01-17 ENCOUNTER — Encounter: Payer: Self-pay | Admitting: Vascular Surgery

## 2016-01-17 VITALS — BP 123/72 | HR 88 | Temp 97.3°F | Resp 16 | Ht 63.0 in | Wt 168.0 lb

## 2016-01-17 DIAGNOSIS — I739 Peripheral vascular disease, unspecified: Secondary | ICD-10-CM

## 2016-01-17 NOTE — Progress Notes (Signed)
Referring Physician: Denita Lung, MD  Patient name: Alyssa Carter MRN: [id number] DOB: 1945-09-23 Sex: female  REASON FOR CONSULT: Numbness and tingling in feet  HPI: Alyssa Carter is a 70 y.o. female,  who presents for evaluation of numbness and tingling in both feet. She states this is been present for about a year. She does also notice some burning in her left calf when she walks more than about 2 blocks. She is not really bothered by this too much. She is still able to cut the grass around her house and do all of the activities that she wishes. She states the burning sensation in her feet sometimes is worse at nighttime. The right is worse than the left. She does smoking greater than 3 minutes they were spent regarding smoking cessation counseling. She denies history of diabetes or alcohol use. Other medical problems include hypertension, hyperlipidemia both of which are stable.  Past Medical History  Diagnosis Date  . Hypertension   . Dyslipidemia   . Wears glasses   . Full dentures   . Tobacco use   . Hematuria     microscopic, several prior evaluations, no source or cause found  . H/O mammogram 2010  . H/O bone density study 8/14    never  . Influenza vaccine side effect     intolerance, declines  . Impaired fasting blood sugar 2008   Past Surgical History  Procedure Laterality Date  . Knee surgery      right  . Tonsillectomy    . Appendectomy  1980  . Spine surgery      lumbar  . Colonoscopy  8/14    never, declines  . Abdominal hysterectomy  1980    total; due to heavy bleeding  . Tubal ligation      Family History  Problem Relation Age of Onset  . Alzheimer's disease Mother   . Other Mother     died of UTI, dehydration  . Other Father 12    failure to thrive, old age, fall and rib fracture  . Diabetes Father   . Diabetes Sister   . Cancer Brother     brain, stomach    SOCIAL HISTORY: Social History   Social History  . Marital Status:  Legally Separated    Spouse Name: N/A  . Number of Children: N/A  . Years of Education: N/A   Occupational History  . Not on file.   Social History Main Topics  . Smoking status: Current Every Day Smoker -- 0.50 packs/day for 40 years  . Smokeless tobacco: Not on file  . Alcohol Use: No  . Drug Use: No  . Sexual Activity: Not on file   Other Topics Concern  . Not on file   Social History Narrative   Lives alone (son moved out).  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 Aspirin     constipation    Current Outpatient Prescriptions  Medication Sig Dispense Refill  . lisinopril-hydrochlorothiazide (PRINZIDE,ZESTORETIC) 20-25 MG tablet Take 1 tablet by mouth daily. 90 tablet 3  . Multiple Vitamin (MULTIVITAMIN WITH MINERALS) TABS tablet Take 1 tablet by mouth daily.    Marland Kitchen omega-3 acid ethyl esters (LOVAZA) 1 G capsule Take 2 capsules (2 g total) by mouth 2 (two) times daily. 120 capsule 5  . rosuvastatin (CRESTOR) 40 MG tablet Take 1 tablet (40 mg total) by mouth daily. [place]  tablet 3   No current facility-administered medications for this visit.    ROS:   General:  No weight loss, Fever, chills  HEENT: No recent headaches, no nasal bleeding, no visual changes, no sore throat  Neurologic: No dizziness, blackouts, seizures. No recent symptoms of stroke or mini- stroke. No recent episodes of slurred speech, or temporary blindness.  Cardiac: No recent episodes of chest pain/pressure, no shortness of breath at rest.  + shortness of breath with exertion.  Denies history of atrial fibrillation or irregular heartbeat  Vascular: No history of rest pain in feet.  No history of claudication.  No history of non-healing ulcer, No history of DVT   Pulmonary: No home oxygen, no productive cough, no hemoptysis,  No asthma or wheezing  Musculoskeletal:  [ ]  Arthritis, [ ]  Low back pain,  [ ]  Joint pain  Hematologic:No  history of hypercoagulable state.  No history of easy bleeding.  No history of anemia  Gastrointestinal: No hematochezia or melena,  No gastroesophageal reflux, no trouble swallowing  Urinary: [ ]  chronic Kidney disease, [ ]  on HD - [ ]  MWF or [ ]  TTHS, [ ]  Burning with urination, [ ]  Frequent urination, [ ]  Difficulty urinating;   Skin: No rashes  Psychological: No history of anxiety,  No history of depression   Physical Examination  Filed Vitals:   01/17/16 1423  BP: 123/72  Pulse: 88  Temp: 97.3 F (36.3 C)  TempSrc: Oral  Resp: 16  Height: 5\' 3"  (1.6 m)  Weight: 168 lb (76.204 kg)  SpO2: 98%    Body mass index is 29.77 kg/(m^2).  General:  Alert and oriented, no acute distress HEENT: Normal Neck: No bruit or JVD Pulmonary: Clear to auscultation bilaterally Cardiac: Regular Rate and Rhythm without murmur Abdomen: Soft, non-tender, non-distended, no mass Skin: No rash Extremity Pulses:  2+ radial, brachial, femoral, absent dorsalis pedis, posterior tibial pulses bilaterally Musculoskeletal: No deformity or edema  Neurologic: Upper and lower extremity motor 5/5 and symmetric  DATA:  Patient had bilateral ABIs and arterial duplex performed at [facility] vascular lab 01/03/2016. Showed a right mid SFA occlusion and a left SFA stenosis. ABI on the right was 0.64 left was 0.70  ASSESSMENT:  Patient complains of burning sensation in her feet which sounds more consistent with neuropathy then significant peripheral arterial disease. She does have bilateral superficial femoral artery stenoses or occlusions. This could certainly produce some claudication symptoms but does not sound like a burning sensation she is describing. She currently does not wish to have an intervention as long as her legs or not at risk and I reassured her especially if she is able to quit smoking that her lifetime risk of limb loss is less than 5%.   PLAN:  She will try to quit smoking and walk for 30  minutes daily. She will continue her statin and would also benefit from taking a daily aspirin. She will follow-up with [place] in 6 months with repeat ABIs to make sure that she is not having any deterioration of her overall perfusion.   Ruta Hinds, MD [facility] Office: [phone] Pager: [phone]

## 2016-01-23 ENCOUNTER — Encounter: Payer: Self-pay | Admitting: Medical

## 2016-03-14 ENCOUNTER — Other Ambulatory Visit: Payer: Self-pay | Admitting: Medical

## 2016-03-14 ENCOUNTER — Telehealth: Payer: Self-pay

## 2016-03-14 DIAGNOSIS — R195 Other fecal abnormalities: Secondary | ICD-10-CM

## 2016-03-14 DIAGNOSIS — Z1211 Encounter for screening for malignant neoplasm of colon: Secondary | ICD-10-CM

## 2016-03-14 NOTE — Telephone Encounter (Signed)
Pt states that she is ready to have a colonoscopy. Please enter the order and have [facility] call her. Thank you, Wells Guiles

## 2016-03-14 NOTE — Telephone Encounter (Signed)
Order is in, please refer for colonoscopy in light of + cologard test

## 2016-03-15 NOTE — Telephone Encounter (Signed)
done

## 2016-03-18 ENCOUNTER — Encounter: Payer: Self-pay | Admitting: Gastroenterology

## 2016-04-01 ENCOUNTER — Other Ambulatory Visit: Payer: Self-pay | Admitting: Vascular Surgery

## 2016-04-01 ENCOUNTER — Ambulatory Visit (AMBULATORY_SURGERY_CENTER): Payer: Self-pay | Admitting: *Deleted

## 2016-04-01 ENCOUNTER — Telehealth: Payer: Self-pay

## 2016-04-01 VITALS — Ht 62.0 in | Wt 167.0 lb

## 2016-04-01 DIAGNOSIS — R202 Paresthesia of skin: Secondary | ICD-10-CM

## 2016-04-01 DIAGNOSIS — R195 Other fecal abnormalities: Secondary | ICD-10-CM

## 2016-04-01 DIAGNOSIS — E782 Mixed hyperlipidemia: Secondary | ICD-10-CM

## 2016-04-01 MED ORDER — NA SULFATE-K SULFATE-MG SULF 17.5-3.13-1.6 GM/177ML PO SOLN: ORAL | 0 refills | Status: DC

## 2016-04-01 NOTE — Progress Notes (Signed)
Patient denies any allergies to eggs or soy. Patient denies any problems with anesthesia/sedation. Patient denies any oxygen use at home and does not take any diet/weight loss medications. Sent Charlett Lango new message that patient needs Suprep sample. Patient is aware.

## 2016-04-01 NOTE — Telephone Encounter (Signed)
Lm that I will call her closer to her procedure and after I get a shipment of Suprep samples to let her know when she can come pick up one up.

## 2016-04-10 ENCOUNTER — Telehealth: Payer: Self-pay

## 2016-04-10 NOTE — Telephone Encounter (Signed)
Left message that I had a suprep sample for her and would put it up front for her to pick up.

## 2016-04-15 ENCOUNTER — Ambulatory Visit (AMBULATORY_SURGERY_CENTER): Payer: Medicare Other | Admitting: Gastroenterology

## 2016-04-15 ENCOUNTER — Encounter: Payer: Self-pay | Admitting: Gastroenterology

## 2016-04-15 VITALS — BP 116/53 | HR 64 | Temp 97.5°F | Resp 13 | Ht 62.0 in | Wt 167.0 lb

## 2016-04-15 DIAGNOSIS — D124 Benign neoplasm of descending colon: Secondary | ICD-10-CM | POA: Diagnosis not present

## 2016-04-15 DIAGNOSIS — D122 Benign neoplasm of ascending colon: Secondary | ICD-10-CM | POA: Diagnosis not present

## 2016-04-15 DIAGNOSIS — D120 Benign neoplasm of cecum: Secondary | ICD-10-CM | POA: Diagnosis not present

## 2016-04-15 DIAGNOSIS — R195 Other fecal abnormalities: Secondary | ICD-10-CM

## 2016-04-15 DIAGNOSIS — D123 Benign neoplasm of transverse colon: Secondary | ICD-10-CM | POA: Diagnosis not present

## 2016-04-15 DIAGNOSIS — Z1211 Encounter for screening for malignant neoplasm of colon: Secondary | ICD-10-CM | POA: Diagnosis not present

## 2016-04-15 DIAGNOSIS — I10 Essential (primary) hypertension: Secondary | ICD-10-CM | POA: Diagnosis not present

## 2016-04-15 MED ORDER — SODIUM CHLORIDE 0.9 % IV SOLN: 500.0000 mL | INTRAVENOUS | Status: DC

## 2016-04-15 NOTE — Progress Notes (Signed)
Called to room to assist during endoscopic procedure.  Patient ID and intended procedure confirmed with present staff. Received instructions for my participation in the procedure from the performing physician.  

## 2016-04-15 NOTE — Progress Notes (Signed)
Report given to PACU RN, vss 

## 2016-04-15 NOTE — Op Note (Signed)
[place] Patient Name: Alyssa Carter Procedure Date: 04/15/2016 9:44 AM MRN: [id number] Endoscopist: Mallie Mussel L. Loletha Carrow , MD Age: 70 Referring MD:  Date of Birth: 1946-05-17 Gender: Female Account #: [id number] Procedure:                Colonoscopy Indications:              Positive Cologuard test Medicines:                Monitored Anesthesia Care Procedure:                Pre-Anesthesia Assessment:                           - Prior to the procedure, a History and Physical                            was performed, and patient medications and                            allergies were reviewed. The patient's tolerance of                            previous anesthesia was also reviewed. The risks                            and benefits of the procedure and the sedation                            options and risks were discussed with the patient.                            All questions were answered, and informed consent                            was obtained. Prior Anticoagulants: The patient has                            taken no previous anticoagulant or antiplatelet                            agents. ASA Grade Assessment: II - A patient with                            mild systemic disease. After reviewing the risks                            and benefits, the patient was deemed in                            satisfactory condition to undergo the procedure.                           After obtaining informed consent, the colonoscope  was passed under direct vision. Throughout the                            procedure, the patient's blood pressure, pulse, and                            oxygen saturations were monitored continuously. The                            Model CF-HQ190L [id number]) scope was introduced                            through the anus and advanced to the the cecum,                            identified by appendiceal orifice and  ileocecal                            valve. The colonoscopy was performed without                            difficulty. The patient tolerated the procedure                            well. The quality of the bowel preparation was                            good. The ileocecal valve, appendiceal orifice, and                            rectum were photographed. Scope In: 9:54:23 AM Scope Out: 10:18:16 AM Scope Withdrawal Time: 0 hours 19 minutes 43 seconds  Total Procedure Duration: 0 hours 23 minutes 53 seconds  Findings:                 The perianal and digital rectal examinations were                            normal.                           A 8 mm polyp was found in the cecum. The polyp was                            sessile. The polyp was removed with a hot snare.                            Resection and retrieval were complete.                           Five sessile polyps were found in the descending                            colon, transverse colon and ascending colon. The  polyps were 2 to 6 mm in size. These polyps were                            removed with a cold snare. Resection and retrieval                            were complete.                           Multiple diverticula were found in the ascending                            colon and left colon.                           Internal hemorrhoids were found during                            retroflexion. The hemorrhoids were Grade I                            (internal hemorrhoids that do not prolapse).                           The exam was otherwise without abnormality on                            direct and retroflexion views. Complications:            No immediate complications. Estimated Blood Loss:     Estimated blood loss: none. Impression:               - One 8 mm polyp in the cecum, removed with a hot                            snare. Resected and retrieved.                            - Five 2 to 6 mm polyps in the descending colon, in                            the transverse colon and in the ascending colon,                            removed with a cold snare. Resected and retrieved.                           - Diverticulosis in the ascending colon and in the                            left colon.                           - Internal hemorrhoids.                           -  The examination was otherwise normal on direct                            and retroflexion views. Recommendation:           - Patient has a contact number available for                            emergencies. The signs and symptoms of potential                            delayed complications were discussed with the                            patient. Return to normal activities tomorrow.                            Written discharge instructions were provided to the                            patient.                           - Resume previous diet.                           - No aspirin, ibuprofen, naproxen, or other                            non-steroidal anti-inflammatory drugs for 3 days                            after polyp removal.                           - Await pathology results.                           - Repeat colonoscopy is recommended for                            surveillance. The colonoscopy date will be                            determined after pathology results from today's                            exam become available for review. Henry L. Loletha Carrow, MD 04/15/2016 10:23:24 AM This report has been signed electronically.

## 2016-04-15 NOTE — Patient Instructions (Signed)
YOU HAD AN ENDOSCOPIC PROCEDURE TODAY AT [facility]:   Refer to the procedure report that was given to you for any specific questions about what was found during the examination.  If the procedure report does not answer your questions, please call your gastroenterologist to clarify.  If you requested that your care partner not be given the details of your procedure findings, then the procedure report has been included in a sealed envelope for you to review at your convenience later.  YOU SHOULD EXPECT: Some feelings of bloating in the abdomen. Passage of more gas than usual.  Walking can help get rid of the air that was put into your GI tract during the procedure and reduce the bloating. If you had a lower endoscopy (such as a colonoscopy or flexible sigmoidoscopy) you may notice spotting of blood in your stool or on the toilet paper. If you underwent a bowel prep for your procedure, you may not have a normal bowel movement for a few days.  Please Note:  You might notice some irritation and congestion in your nose or some drainage.  This is from the oxygen used during your procedure.  There is no need for concern and it should clear up in a day or so.  SYMPTOMS TO REPORT IMMEDIATELY:   Following lower endoscopy (colonoscopy or flexible sigmoidoscopy):  Excessive amounts of blood in the stool  Significant tenderness or worsening of abdominal pains  Swelling of the abdomen that is new, acute  Fever of 100F or higher   For urgent or emergent issues, a gastroenterologist can be reached at any hour by calling [phone].   DIET: Your first meal following the procedure should be a small meal and then it is ok to progress to your normal diet. Heavy or fried foods are harder to digest and may make you feel nauseous or bloated.  Likewise, meals heavy in dairy and vegetables can increase bloating.  Drink plenty of fluids but you should avoid alcoholic beverages for 24  hours.  ACTIVITY:  You should plan to take it easy for the rest of today and you should NOT DRIVE or use heavy machinery until tomorrow (because of the sedation medicines used during the test).    FOLLOW UP: Our staff will call the number listed on your records the next business day following your procedure to check on you and address any questions or concerns that you may have regarding the information given to you following your procedure. If we do not reach you, we will leave a message.  However, if you are feeling well and you are not experiencing any problems, there is no need to return our call.  We will assume that you have returned to your regular daily activities without incident.  If any biopsies were taken you will be contacted by phone or by letter within the next 1-3 weeks.  Please call us at [phone] if you have not heard about the biopsies in 3 weeks.    SIGNATURES/CONFIDENTIALITY: You and/or your care partner have signed paperwork which will be entered into your electronic medical record.  These signatures attest to the fact that that the information above on your After Visit Summary has been reviewed and is understood.  Full responsibility of the confidentiality of this discharge information lies with you and/or your care-partner.    No aspirin,ibuprofen,naproxen,or other non-steroidal anti-inflammatory for 3 days. Resume remainder of medications. Information given on polyps,diverticulosis and hemorrhoids.

## 2016-04-16 ENCOUNTER — Telehealth: Payer: Self-pay

## 2016-04-16 NOTE — Telephone Encounter (Signed)
  Follow up Call-  Call back number 04/15/2016  Post procedure Call Back phone  # [phone]  Permission to leave phone message Yes  Some recent data might be hidden    Patient was called for follow up after her procedure on 04/15/2016. Patient answered the phone and then hung up. I was not able to leave a message. I did not attempt to call again.

## 2016-04-19 ENCOUNTER — Encounter: Payer: Self-pay | Admitting: Gastroenterology

## 2016-07-04 ENCOUNTER — Ambulatory Visit (INDEPENDENT_AMBULATORY_CARE_PROVIDER_SITE_OTHER): Payer: Medicare Other | Admitting: Medical

## 2016-07-04 ENCOUNTER — Encounter: Payer: Self-pay | Admitting: Medical

## 2016-07-04 VITALS — BP 120/60 | HR 84 | Temp 98.0°F | Wt 167.2 lb

## 2016-07-04 DIAGNOSIS — A084 Viral intestinal infection, unspecified: Secondary | ICD-10-CM

## 2016-07-04 DIAGNOSIS — R112 Nausea with vomiting, unspecified: Secondary | ICD-10-CM | POA: Diagnosis not present

## 2016-07-04 DIAGNOSIS — R509 Fever, unspecified: Secondary | ICD-10-CM

## 2016-07-04 DIAGNOSIS — B09 Unspecified viral infection characterized by skin and mucous membrane lesions: Secondary | ICD-10-CM

## 2016-07-04 DIAGNOSIS — R197 Diarrhea, unspecified: Secondary | ICD-10-CM

## 2016-07-04 DIAGNOSIS — A09 Infectious gastroenteritis and colitis, unspecified: Secondary | ICD-10-CM | POA: Diagnosis not present

## 2016-07-04 NOTE — Progress Notes (Signed)
  Subjective:  Alyssa Carter is a 70 y.o. female who presents for rash Chief Complaint  Patient presents with  . Rash    pt reports nausea, vomiting yesterday. Reports she is having a rash today. She is itching all over.    She notes 2 day hx/o nausea, few episodes of vomiting, few episodes of loose stool and rash widespread on arms, legs, torso, some on face, but not on palms and soles.   No URI symptoms, no urinary symptoms, no blood in stool.  May have had low grade fever, no mucous in stool.  No body aches or chills, no sick contacts.  No new exposures, no recent medication changes.  She is mainly worried about the rash that is somewhat itchy.  No prior similar rash. No other aggravating or relieving factors.    No other c/o.  The following portions of the patient's history were reviewed and updated as appropriate: allergies, current medications, past family history, past medical history, past social history, past surgical history and problem list.  ROS Otherwise as in subjective above  Objective: Physical Exam  BP 120/60   Pulse 84   Temp 98 F (36.7 C) (Oral)   Wt 167 lb 3.2 oz (75.8 kg)   BMI 30.58 kg/m '  General appearance: alert, no distress, WD/WN Skin: wide spread patchy somewhat reticular rash flat throughout arms, legs, anterior torso, more faint on back, none on soles and palms. HEENT: normocephalic, sclerae anicteric, conjunctiva pink and moist, TMs pearly, nares patent, no discharge or erythema, pharynx normal Oral cavity: MMM, no lesions Neck: supple, no lymphadenopathy, no thyromegaly, no masses Heart: RRR, normal S1, S2, no murmurs Lungs: CTA bilaterally, no wheezes, rhonchi, or rales Abdomen: +bs, soft, mild generalized tenderness, otherwise non tender, non distended, no masses, no hepatomegaly, no splenomegaly Pulses: 2+ radial pulses, 2+ pedal pulses, normal cap refill Ext: no  edema   Assessment: Encounter Diagnoses  Name Primary?  . Viral exanthem Yes  . Nausea and vomiting, intractability of vomiting not specified, unspecified vomiting type   . Diarrhea of presumed infectious origin   . Low grade fever   . Viral gastroenteritis     Plan: Discussed symptoms, exam findings, etiology suggestive of viral gastroenteritis with viral exanthem.  discussed other causes of rash, no suspected occult malignancy, she is up to date on cancer screens, she has had no new exposures, no new medications changes, no new foods.   She will use OTC Pepto bismol, will begin OTC benadryl 1-2 times daily, hydrate well, and give it more time to resolve.  discussed hand washing, precautions to avoid spread to others.   If worse or not improving in the next few days, then recheck.

## 2016-07-25 ENCOUNTER — Ambulatory Visit: Payer: Medicare Other | Admitting: Family

## 2016-07-25 ENCOUNTER — Encounter (HOSPITAL_COMMUNITY): Payer: Medicare Other

## 2016-07-31 DIAGNOSIS — Z23 Encounter for immunization: Secondary | ICD-10-CM | POA: Diagnosis not present

## 2016-08-20 ENCOUNTER — Ambulatory Visit (INDEPENDENT_AMBULATORY_CARE_PROVIDER_SITE_OTHER): Payer: Medicare Other | Admitting: Family Medicine

## 2016-08-20 ENCOUNTER — Encounter: Payer: Self-pay | Admitting: Family Medicine

## 2016-08-20 VITALS — BP 130/80 | HR 79 | Wt 169.2 lb

## 2016-08-20 DIAGNOSIS — M5442 Lumbago with sciatica, left side: Secondary | ICD-10-CM

## 2016-08-20 MED ORDER — CYCLOBENZAPRINE HCL 10 MG PO TABS: 10.0000 mg | ORAL_TABLET | Freq: Three times a day (TID) | ORAL | 0 refills | Status: DC | PRN

## 2016-08-20 NOTE — Patient Instructions (Signed)
Use heat or ice to your low back and take ibuprofen 600 mg with food 3 times a day. You can take the muscle relaxant as needed for the next couple of days for pain but do not drive or drink alcohol with this. Let me know if you are not improving.

## 2016-08-20 NOTE — Progress Notes (Signed)
   Subjective:    Patient ID: Alyssa Carter, female    DOB: Nov 05, 1945, 70 y.o.   MRN: [id number]  HPI Chief Complaint  Patient presents with  . back pain    back pain- 2am this morning getting out of bed.    She is here with complaints of left low back pain that started at 2 AM. States she was shoveling snow yesterday Describes pain as sharp and stabbing and intermittent and radiates down into her left buttock.  Pain is worse when going from sitting to standing and relieved with sitting and walking after she gets moving.  She has taken 600 mg Ibuprofen, 2 doses since pain started with minimal relief.   She does have history of back surgery that involved lumbar region. This was approximately 20 years ago.   Denies fever, chills, neck pain, chest pain, shortness of breath, abdominal pain, nausea, vomiting, diarrhea. No numbness, tingling, weakness. No loss of control of bowels or bladder.  Reviewed allergies, medications, past medical and social history.   Review of Systems Pertinent positives and negatives in the history of present illness.     Objective:   Physical Exam BP 130/80   Pulse 79   Wt 169 lb 3.2 oz (76.7 kg)   BMI 30.95 kg/m   Alert and in no distress. No asymmetry to hips or back, no rash, erythema, or bruising to low back or buttock. Normal sensation, no tenderness, pain with flexion and right lateral bending. Patellar and achilles DTRs normal.  Negative straight leg test.      Assessment & Plan:  Acute left-sided low back pain with left-sided sciatica - Plan: cyclobenzaprine (FLEXERIL) 10 MG tablet  Discussed that she should try using heat 20 minutes 3 times a day and then do some light stretching like I demonstrated in the office. Recommended she take ibuprofen 600 mg no more than every 8 hours. She may use the Flexeril if not getting pain relief. She will let us know if she is not feeling better in the next 4-5 days. Discussed that she should continue  using good body mechanics when lifting at work or home.

## 2016-10-22 ENCOUNTER — Encounter: Payer: Self-pay | Admitting: Family Medicine

## 2016-10-22 ENCOUNTER — Ambulatory Visit (INDEPENDENT_AMBULATORY_CARE_PROVIDER_SITE_OTHER): Payer: Medicare Other | Admitting: Family Medicine

## 2016-10-22 VITALS — BP 130/70 | HR 81 | Resp 16 | Wt 168.0 lb

## 2016-10-22 DIAGNOSIS — S0990XA Unspecified injury of head, initial encounter: Secondary | ICD-10-CM

## 2016-10-22 DIAGNOSIS — J069 Acute upper respiratory infection, unspecified: Secondary | ICD-10-CM

## 2016-10-22 DIAGNOSIS — R519 Headache, unspecified: Secondary | ICD-10-CM

## 2016-10-22 DIAGNOSIS — R51 Headache: Secondary | ICD-10-CM | POA: Diagnosis not present

## 2016-10-22 NOTE — Patient Instructions (Signed)
Take tylenol and you may continue icing your head. If you have any worsening symptoms or any new symptoms as discussed, dizziness, vomiting or severe headache in spite of taking Tylenol then you will need to call immediately. Be aware of any mental status changes.   In regards to your cold symptoms- this is a viral illness and I recommend treating your symptoms at this point.  Mucinex DM for congestion and cough, drink extra water, use salt water gargles for throat irritation. Call if you are not improving by days 7-10 of your illness or if you develop fever, wheezing or worsening symptoms.

## 2016-10-22 NOTE — Progress Notes (Signed)
Subjective:    Patient ID: Alyssa Carter, female    DOB: 06/28/1946, 71 y.o.   MRN: [id number]  HPI Chief Complaint  Patient presents with  . Fall    hit head and has bruise on her head. Reports pain on Right side of head.    She is here with complaints of stumbling and hitting the right side of her head on the door going into her bathroom at 3 am today. States she did not fall to the floor. She caught herself.  Denies LOC. No neck or back pain. She is not taking a blood thinner or aspirin.  States she had a headache immediately. No other symptoms since injury occurred.  Denies fever, chills, dizziness, blurred or double vision, tinnitus, balance issues, confusion, irritability, chest pain, shortness of breath, nausea, vomiting, diarrhea. Denies history of falls.  States she immediately put ice on her head. Has not taken anything for headache. Rates headache a 7/10 and states it is a dull constant sensation.  Denies numbness, tingling or weakness.   History of concussion over a year ago when her shower rod fell on her head. She had more symptoms then.   Reports having a 3 day history of rhinorrhea and nasal congestion. No body aches, ear pain, sore throat, cough.   Reviewed allergies, medications, past medical, and social history.    Review of Systems Pertinent positives and negatives in the history of present illness.     Objective:   Physical Exam  Constitutional: She is oriented to person, place, and time. She appears well-developed and well-nourished. She does not have a sickly appearance. No distress.  HENT:  Head:    Right Ear: Tympanic membrane and ear canal normal.  Left Ear: Tympanic membrane and ear canal normal.  Nose: Rhinorrhea present. Right sinus exhibits no maxillary sinus tenderness and no frontal sinus tenderness. Left sinus exhibits no maxillary sinus tenderness and no frontal sinus tenderness.  Mouth/Throat: Uvula is midline, oropharynx is clear and  moist and mucous membranes are normal.  Reddish - purple bruise to right forehead, no hematoma. Tenderness to palpation. She also has tenderness to right side of her head. No edema.  No TMJ tenderness. No orbital asymmetry or tenderness.   Nares patent with mild erythema, and clear rhinorrhea.   Eyes: Conjunctivae, EOM and lids are normal. Pupils are equal, round, and reactive to light. No scleral icterus.  Neck: Normal range of motion and full passive range of motion without pain. Neck supple.  Cardiovascular: Normal rate, regular rhythm, normal heart sounds and normal pulses.   Pulmonary/Chest: Effort normal and breath sounds normal.  Musculoskeletal:       Cervical back: Normal.  Lymphadenopathy:    She has no cervical adenopathy.  Neurological: She is alert and oriented to person, place, and time. She has normal strength and normal reflexes. No cranial nerve deficit or sensory deficit. Coordination and gait normal.  Skin: Skin is warm and dry. Bruising noted. No pallor.  Right forehead  Psychiatric: She has a normal mood and affect. Her speech is normal and behavior is normal. Judgment and thought content normal. Cognition and memory are normal.   BP 130/70   Pulse 81   Resp 16   Wt 168 lb (76.2 kg)   SpO2 98%   BMI 30.73 kg/m       Assessment & Plan:  Injury of head, initial encounter  Acute nonintractable headache, unspecified headache type  Acute URI  Discussed that she  has a headache and no other signs of concussion. Advised her on red flags of worsening symptoms such dizziness, vomiting, severe headache. Her neuro exam is normal and I do not suspect that she is in any danger. She will try Tylenol for headache. She will call if she develops any other symptoms.  She appears to have an acute viral illness and recommend supportive therapy. She will follow up if symptoms worsen or if they last longer than 7-10 days.

## 2017-01-08 ENCOUNTER — Encounter: Payer: Self-pay | Admitting: Medical

## 2017-01-08 ENCOUNTER — Ambulatory Visit (INDEPENDENT_AMBULATORY_CARE_PROVIDER_SITE_OTHER): Payer: Medicare Other | Admitting: Medical

## 2017-01-08 ENCOUNTER — Ambulatory Visit
Admission: RE | Admit: 2017-01-08 | Discharge: 2017-01-08 | Disposition: A | Discharge status: Home / Self Care | Payer: Medicare Other | Source: Ambulatory Visit | Attending: Medical | Admitting: Medical

## 2017-01-08 VITALS — BP 122/80 | HR 72 | Ht 63.0 in | Wt 167.2 lb

## 2017-01-08 DIAGNOSIS — Z9889 Other specified postprocedural states: Secondary | ICD-10-CM

## 2017-01-08 DIAGNOSIS — R202 Paresthesia of skin: Secondary | ICD-10-CM | POA: Diagnosis not present

## 2017-01-08 DIAGNOSIS — Z1231 Encounter for screening mammogram for malignant neoplasm of breast: Secondary | ICD-10-CM | POA: Diagnosis not present

## 2017-01-08 DIAGNOSIS — F17200 Nicotine dependence, unspecified, uncomplicated: Secondary | ICD-10-CM

## 2017-01-08 DIAGNOSIS — I739 Peripheral vascular disease, unspecified: Secondary | ICD-10-CM | POA: Diagnosis not present

## 2017-01-08 DIAGNOSIS — G8929 Other chronic pain: Secondary | ICD-10-CM

## 2017-01-08 DIAGNOSIS — Z7185 Encounter for immunization safety counseling: Secondary | ICD-10-CM

## 2017-01-08 DIAGNOSIS — Z0000 Encounter for general adult medical examination without abnormal findings: Secondary | ICD-10-CM | POA: Diagnosis not present

## 2017-01-08 DIAGNOSIS — E2839 Other primary ovarian failure: Secondary | ICD-10-CM

## 2017-01-08 DIAGNOSIS — M25511 Pain in right shoulder: Secondary | ICD-10-CM | POA: Diagnosis not present

## 2017-01-08 DIAGNOSIS — Z7189 Other specified counseling: Secondary | ICD-10-CM

## 2017-01-08 DIAGNOSIS — I10 Essential (primary) hypertension: Secondary | ICD-10-CM | POA: Diagnosis not present

## 2017-01-08 DIAGNOSIS — Z1239 Encounter for other screening for malignant neoplasm of breast: Secondary | ICD-10-CM

## 2017-01-08 DIAGNOSIS — E782 Mixed hyperlipidemia: Secondary | ICD-10-CM

## 2017-01-08 DIAGNOSIS — Z129 Encounter for screening for malignant neoplasm, site unspecified: Secondary | ICD-10-CM

## 2017-01-08 LAB — CBC
HCT: 39.8 % (ref 35.0–45.0)
Hemoglobin: 13.3 g/dL (ref 11.7–15.5)
MCH: 31.2 pg (ref 27.0–33.0)
MCHC: 33.4 g/dL (ref 32.0–36.0)
MCV: 93.4 fL (ref 80.0–100.0)
MPV: 9.6 fL (ref 7.5–12.5)
PLATELETS: 286 10*3/uL (ref 140–400)
RBC: 4.26 MIL/uL (ref 3.80–5.10)
RDW: 13.1 % (ref 11.0–15.0)
WBC: 6.5 10*3/uL (ref 4.0–10.5)

## 2017-01-08 LAB — COMPREHENSIVE METABOLIC PANEL
ALBUMIN: 4.5 g/dL (ref 3.6–5.1)
ALT: 25 U/L (ref 6–29)
AST: 19 U/L (ref 10–35)
Alkaline Phosphatase: 53 U/L (ref 33–130)
BUN: 22 mg/dL (ref 7–25)
CHLORIDE: 103 mmol/L (ref 98–110)
CO2: 26 mmol/L (ref 20–31)
Calcium: 10.1 mg/dL (ref 8.6–10.4)
Creat: 0.91 mg/dL (ref 0.50–0.99)
Glucose, Bld: 99 mg/dL (ref 65–99)
POTASSIUM: 4.9 mmol/L (ref 3.5–5.3)
Sodium: 140 mmol/L (ref 135–146)
TOTAL PROTEIN: 7.1 g/dL (ref 6.1–8.1)
Total Bilirubin: 0.5 mg/dL (ref 0.2–1.2)

## 2017-01-08 LAB — LIPID PANEL
CHOL/HDL RATIO: 3.6 ratio (ref <5.0)
CHOLESTEROL: 134 mg/dL (ref <200)
HDL: 37 mg/dL — ABNORMAL LOW (ref >50)
LDL Cholesterol: 45 mg/dL (ref <100)
TRIGLYCERIDES: 259 mg/dL — AB (ref <150)
VLDL: 52 mg/dL — ABNORMAL HIGH (ref <30)

## 2017-01-08 NOTE — Patient Instructions (Signed)
Recommendations:  See your eye doctor yearly for routine vision care.  See your dentist yearly for routine dental care including hygiene visits twice yearly.  Schedule mammogram  Schedule bone density screen  exercise daily  Eat a healthy low fat diet  Go for shoulder xray  Check insurance coverage for shingles and pneumonia vaccines.  I recommend both.    Get [place] a copy of Advanced Directives  I recommend a follow up visit at the vascular clinic since its been a year  If you get numbess and burning pains in feet and arms, we can consider beginning a medication to help with these chronic pains  Continue efforts to STOP SMOKING!

## 2017-01-08 NOTE — Progress Notes (Signed)
Subjective:    Alyssa Carter is a 71 y.o. female who presents for Preventative Services visit and chronic medical problems/med check visit.    Primary Care Provider Crisoforo Oxford, PA-C here for primary care  Current Health Care Team: Sees vision works No dentist, has full dentures Dr. Oneida Alar, vascular surgery Dr. Loletha Carrow, GI  Medical Services you may have received from other than [facility] providers in the past year (date may be approximate) none  Exercise Current exercise habits: walking   Nutrition/Diet Current diet: in general, a "healthy" diet    Depression Screen Depression screen [facility] 2/9 01/08/2017  Decreased Interest 0  Down, Depressed, Hopeless 0  PHQ - 2 Score 0    Activities of Daily Living Screen/Functional Status Survey Is the patient deaf or have difficulty hearing?: No Does the patient have difficulty seeing, even when wearing glasses/contacts?: No Does the patient have difficulty concentrating, remembering, or making decisions?: No Does the patient have difficulty walking or climbing stairs?: No Does the patient have difficulty dressing or bathing?: No Does the patient have difficulty doing errands alone such as visiting a doctor's office or shopping?: No  Can patient draw a clock face showing 3:15 o'clock, yes  Fall Risk Screen Fall Risk  01/08/2017 01/17/2016 11/09/2014  Falls in the past year? Yes No No  Number falls in past yr: 1 - -  Risk for fall due to : Impaired balance/gait - -    Gait Assessment: Normal gait observed yes  Advanced directives Does patient have a [place]? Yes Does patient have a Living Will? Yes  Past Medical History:  Diagnosis Date  . Dyslipidemia   . Full dentures   . H/O bone density study 8/14   never  . H/O mammogram 2010  . Hematuria    microscopic, several prior evaluations, no source or cause found  . Hyperlipidemia   . Hypertension   . Impaired fasting blood sugar 2008  .  Influenza vaccine side effect    intolerance, declines  . Tobacco use   . Wears glasses     Past Surgical History:  Procedure Laterality Date  . ABDOMINAL HYSTERECTOMY  1980   total; due to heavy bleeding  . APPENDECTOMY  1980  . KNEE SURGERY     right  . SPINE SURGERY     lumbar  . TONSILLECTOMY    . TUBAL LIGATION      Social History   Social History  . Marital status: Legally Separated    Spouse name: N/A  . Number of children: N/A  . Years of education: N/A   Occupational History  . Not on file.   Social History Main Topics  . Smoking status: Current Every Day Smoker    Packs/day: 0.50    Years: 40.00    Types: Cigarettes  . Smokeless tobacco: Never Used  . Alcohol use No  . Drug use: No  . Sexual activity: Not on file   Other Topics Concern  . Not on file   Social History Narrative   Lives alone (son moved out).  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 Alzheimer's disease Mother   . Other Mother     died of UTI, dehydration  . Other Father 32    failure to thrive, old age, fall and rib fracture  . Diabetes Father   . Diabetes Sister   .  Cancer Brother     brain, stomach  . Colon cancer Neg Hx      Current Outpatient Prescriptions:  .  lisinopril-hydrochlorothiazide (PRINZIDE,ZESTORETIC) 20-25 MG tablet, Take 1 tablet by mouth daily., Disp: 90 tablet, Rfl: 3 .  Multiple Vitamin (MULTIVITAMIN WITH MINERALS) TABS tablet, Take 1 tablet by mouth daily., Disp: , Rfl:  .  omega-3 acid ethyl esters (LOVAZA) 1 G capsule, Take 2 capsules (2 g total) by mouth 2 (two) times daily., Disp: 120 capsule, Rfl: 5 .  rosuvastatin (CRESTOR) 40 MG tablet, Take 1 tablet (40 mg total) by mouth daily., Disp: 90 tablet, Rfl: 3  Current Facility-Administered Medications:  .  0.9 %  sodium chloride infusion, 500 mL, Intravenous, Continuous, Nelida Meuse III, MD  Allergies  Allergen  Reactions  . Aspirin     constipation    History reviewed: allergies, current medications, past family history, past medical history, past social history, past surgical history and problem list  Chronic issues discussed: HTN - compliant with medication Dyslipidemia - compliant with medication  Acute issues discussed: Skin lesion under bra on right front and back that irritates her, gets roughed up by bra.  Pain starts in right upper back/shoulder, pain radiates down right arm.  Wakes her up at night.  No specific injury. 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 Yes  Normal Appearance?Yes  Oriented to person? Yes  Place? Yes   Time? Yes  Recall of three objects?  Yes  Can perform simple calculations? Yes  Displays appropriate judgment?Yes  Can read the correct time from a watch face?Yes  General appearance: alert, no distress, WD/WN, white female  Nutritional Status: Inadequate calore intake? no Loss of muscle mass? no Loss of fat beneath skin? no Localized or general edema? no Diminished functional status? no  Other pertinent exam: HEENT: normocephalic, sclerae anicteric, TMs pearly, nares patent, no discharge or erythema, pharynx normal SKin: right mid back under bra with 39mm diameter raised crusted lesion,likely SK, similar red/brown 35mm diameter oval lesion under inferior bra line, scattered macules and cherry hemangiomas throughout torso, no other worrisome lesions Oral cavity: MMM, no lesions, full dentures Neck: supple, no lymphadenopathy, no thyromegaly, no masses, no bruits Heart: RRR, normal S1, S2, no murmurs Lungs: CTA bilaterally, no wheezes, rhonchi, or rales Abdomen: +bs, soft, non tender, non distended, no masses, no hepatomegaly, no  splenomegaly Musculoskeletal: mild pain with right shoulder ROM in general, decrease right internal and external ROM, otherwise no deformity, no swelling, no other tenderness, rest of UE and LE exam unremarkable, lumbar surgical scar, non tender, no swelling, no obvious deformity Extremities: no edema, no cyanosis, no clubbing Pulses: decreased pedal pulses bilat, but 1-2+ symmetric, upper extremities, normal cap refill Neurological: alert, oriented x 3, CN2-12 intact, strength normal upper extremities and lower extremities, sensation normal throughout, DTRs 2+ throughout, no cerebellar signs, gait normal Psychiatric: normal affect, behavior normal, pleasant  Breast/gyn - declined  Assessment:   Encounter Diagnoses  Name Primary?  . Medicare annual wellness visit, subsequent Yes  . Essential hypertension   . History of back surgery   . Mixed dyslipidemia   . Paresthesia of both lower extremities   .  Screening for cancer   . Tobacco use disorder   . Vaccine counseling   . Estrogen deficiency   . PVD (peripheral vascular disease) ([place])   . Chronic right shoulder pain   . Screening for breast cancer      Plan:   A preventative services visit was completed today.  During the course of the visit today, we discussed and counseled about appropriate screening and preventive services.  A health risk assessment was established today that included a review of current medications, allergies, social history, family history, medical and preventative health history, biometrics, and preventative screenings to identify potential safety concerns or impairments.  A personalized plan was printed today for your records and use.   Personalized health advice and education was given today to reduce health risks and promote self management and wellness.  Information regarding end of life planning was discussed today.  Conditions/risks identified: right shoulder pain - go for xray Skin lesion - can return  at her convenience for excision of right upper back and possible right under breast lesions within bra line  Chronic problems discussed today: HTN - c/t same medication Dyslipidemia - c/t same medication, labs today paresthesia - consider medication PVD - advised f/u visit with vascular surgery at this time Tobacco use - stop smoking advised   Acute problems discussed today: Right shoulder pain - go for xray  Recommendations:  I recommend a yearly ophthalmology/optometry visit for glaucoma screening and eye checkup  I recommended a yearly dental visit for hygiene and checkup  Advanced directives - discussed nature and purpose of Advanced Directives, encouraged them to complete them if they have not done so and/or encouraged them to get [place] a copy if they have done this already.  I recommend a screening mammogram every 1-2 years  Your last colonoscopy was 2017.  I recommend you have a 2022  Advised breast/pelvic exam - she will consider options and let me know   Referrals today: Bone density and mammogram  Immunizations: I recommended a yearly influenza vaccine, typically in September when the vaccine is usually available Is the Pneumococcal vaccine up to date: no. Is the Shingles vaccine up to date: no.   Is the Td/Tdap vaccine up to date: no.   Medicare Attestation A preventative services visit was completed today.  During the course of the visit the patient was educated and counseled about appropriate screening and preventive services.  A health risk assessment was established with the patient that included a review of current medications, allergies, social history, family history, medical and preventative health history, biometrics, and preventative screenings to identify potential safety concerns or impairments.  A personalized plan was printed today for the patient's records and use.   Personalized health advice and education was given today to reduce health risks and  promote self management and wellness.  Information regarding end of life planning was discussed today.  Crisoforo Oxford, PA-C   01/08/2017

## 2017-01-09 ENCOUNTER — Other Ambulatory Visit: Payer: Self-pay | Admitting: Medical

## 2017-01-09 LAB — HEMOGLOBIN A1C
Hgb A1c MFr Bld: 5.5 % (ref <5.7)
MEAN PLASMA GLUCOSE: 111 mg/dL

## 2017-01-09 MED ORDER — ROSUVASTATIN CALCIUM 40 MG PO TABS: 40.0000 mg | ORAL_TABLET | Freq: Every day | ORAL | 3 refills | Status: DC

## 2017-01-09 MED ORDER — LISINOPRIL-HYDROCHLOROTHIAZIDE 20-25 MG PO TABS: 1.0000 | ORAL_TABLET | Freq: Every day | ORAL | 3 refills | Status: DC

## 2017-01-09 MED ORDER — OMEGA-3-ACID ETHYL ESTERS 1 G PO CAPS: 2.0000 g | ORAL_CAPSULE | Freq: Two times a day (BID) | ORAL | 11 refills | Status: DC

## 2017-01-09 MED ORDER — GABAPENTIN 100 MG PO CAPS: 100.0000 mg | ORAL_CAPSULE | Freq: Every day | ORAL | 2 refills | Status: DC

## 2017-01-22 ENCOUNTER — Telehealth: Payer: Self-pay | Admitting: Medical

## 2017-01-22 NOTE — Telephone Encounter (Signed)
I don't think is seen her for the shoulder so you can offer her an appointment with me or send her to orthopedics.

## 2017-01-22 NOTE — Telephone Encounter (Signed)
Pt requesting referral to ortho for her shoulder issues for a second opinion

## 2017-01-23 ENCOUNTER — Other Ambulatory Visit: Payer: Self-pay

## 2017-01-23 DIAGNOSIS — G8929 Other chronic pain: Secondary | ICD-10-CM

## 2017-01-23 DIAGNOSIS — M25511 Pain in right shoulder: Principal | ICD-10-CM

## 2017-01-23 NOTE — Telephone Encounter (Signed)
Called and spoke with pt she wants a referral to othro. Sent referral [facility].

## 2017-01-23 NOTE — Telephone Encounter (Signed)
Please help.

## 2017-01-28 ENCOUNTER — Other Ambulatory Visit: Payer: Self-pay | Admitting: Medical

## 2017-01-28 ENCOUNTER — Encounter (INDEPENDENT_AMBULATORY_CARE_PROVIDER_SITE_OTHER): Payer: Self-pay | Admitting: Orthopaedic Surgery

## 2017-01-28 ENCOUNTER — Ambulatory Visit (INDEPENDENT_AMBULATORY_CARE_PROVIDER_SITE_OTHER): Payer: Medicare Other | Admitting: Orthopaedic Surgery

## 2017-01-28 ENCOUNTER — Ambulatory Visit (INDEPENDENT_AMBULATORY_CARE_PROVIDER_SITE_OTHER): Payer: Medicare Other

## 2017-01-28 VITALS — BP 120/62 | HR 92 | Resp 14 | Ht 63.0 in | Wt 168.0 lb

## 2017-01-28 DIAGNOSIS — R202 Paresthesia of skin: Secondary | ICD-10-CM | POA: Diagnosis not present

## 2017-01-28 DIAGNOSIS — R200 Anesthesia of skin: Secondary | ICD-10-CM | POA: Diagnosis not present

## 2017-01-28 DIAGNOSIS — M542 Cervicalgia: Secondary | ICD-10-CM

## 2017-01-28 DIAGNOSIS — M25511 Pain in right shoulder: Secondary | ICD-10-CM | POA: Diagnosis not present

## 2017-01-28 DIAGNOSIS — M7541 Impingement syndrome of right shoulder: Secondary | ICD-10-CM | POA: Diagnosis not present

## 2017-01-28 NOTE — Progress Notes (Signed)
Office Visit Note   Patient: Alyssa Carter           Date of Birth: [birth year]           MRN: [id number] Visit Date: 01/28/2017              Requested by: Carlena Hurl, PA-C [address] [place], [place] [address] PCP: Carlena Hurl, PA-C   Assessment & Plan: Visit Diagnoses:  1. Numbness and tingling in right hand   2. Right shoulder pain, unspecified chronicity   3. Neck pain on right side   4. Impingement syndrome of right shoulder     Plan:  #1: Wrist splint to the right wrist #2: MRI scan of the right shoulder to rule out rotator cuff tear #3: EMGs right upper extremity rule out carpal tunnel syndrome  Follow-Up Instructions: Return in about 2 weeks (around 02/11/2017).   Orders:  Orders Placed This Encounter  Procedures  . XR Cervical Spine 2 or 3 views  . XR Wrist Complete Right  . Ambulatory referral to Physical Medicine Rehab   No orders of the defined types were placed in this encounter.     Procedures: No procedures performed   Clinical Data: No additional findings.   Subjective: Chief Complaint  Patient presents with  . Right Shoulder - Pain, Numbness, Weakness    Alyssa Carter is a 25 y o that presents with R shoulder pain x 6-8 mo. 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 She is now symptomatic of Right hand numbness, tingling and limited ROM in R shoulder.     Alyssa Carter is a 71 -year-old white female that presents with R shoulder pain the past 6-8 mo. she had x-rays performed on 01/08/2017 of the right shoulder which did not show any acute bony abnormality. There was some sclerosing of the greater tuberosity. Radiologist had suggested MRI scan.  She is not complaining of limited ROM in right shoulder and pain.   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 She is now symptomatic with Right hand numbness and tingling.  He does also have occasional  cervical spine pain with some pain into the right the shoulder area.      Review of Systems  All other systems reviewed and are negative.    Objective: Vital Signs: BP 120/62   Pulse 92   Resp 14   Ht 5\' 3"  (1.6 m)   Wt 168 lb (76.2 kg)   BMI 29.76 kg/m   Physical Exam  Constitutional: She is oriented to person, place, and time. She appears well-developed and well-nourished.  HENT:  Head: Normocephalic and atraumatic.  Eyes: EOM are normal. Pupils are equal, round, and reactive to light.  Pulmonary/Chest: Effort normal.  Neurological: She is alert and oriented to person, place, and time.  Skin: Skin is warm and dry.  Psychiatric: She has a normal mood and affect. Her behavior is normal. Judgment and thought content normal.    Ortho Exam  Right shoulder exam today reveals 135 of forward flexion with pain, 90 of abduction with pain, with the arm at 90 of abduction and she has about 70-80 of external rotation 40 of internal rotation. She does have some weakness in abduction as well as resistance against external rotation.  She has decreased cervical spine range of motion with right and left rotation only to  about 60. Forward flexion near chin to chest. Backward extension to about 40. She does have some tingling in the right arm especially with turning to the right.  The right hand reveals a positive Phalen's test. Also positive Tinel sign at the carpal tunnel. Numbness in the thumb and index finger as noted on exam. Specialty Comments:  No specialty comments available.  Imaging: Xr Wrist Complete Right  Result Date: 01/28/2017 Views of the right wrist does reveal some cystic changes in the lunate. Some mild joint space narrowing scapholunate as well as scapho-radial joint.  Xr Cervical Spine 2 Or 3 Views  Result Date: 01/28/2017 Two-view cervical spine reveals C5-6 anterior spurring inferiorly off of the C5 and some superior anterior spurring at C6. He appears to be  maintained joint spaces. Some calcification possibly in the carotids more on the left than on the right.    PMFS History: Patient Active Problem List   Diagnosis Date Noted  . Screening for breast cancer 01/08/2017  . Estrogen deficiency 01/08/2017  . PVD (peripheral vascular disease) ([place]) 01/08/2017  . Chronic right shoulder pain 01/08/2017  . Paresthesia of both lower extremities 05/22/2015  . Essential hypertension 05/22/2015  . Mixed dyslipidemia 05/22/2015  . History of back surgery 05/22/2015  . Screening for cancer 05/22/2015  . Vaccine counseling 05/22/2015  . Tobacco use disorder 03/30/2014   Past Medical History:  Diagnosis Date  . Dyslipidemia   . Full dentures   . H/O bone density study 8/14   never  . H/O mammogram 2010  . Hematuria    microscopic, several prior evaluations, no source or cause found  . Hyperlipidemia   . Hypertension   . Impaired fasting blood sugar 2008  . Influenza vaccine side effect    intolerance, declines  . Tobacco use   . Wears glasses     Family History  Problem Relation Age of Onset  . Alzheimer's disease Mother   . Other Mother        died of UTI, dehydration  . Other Father 50       failure to thrive, old age, fall and rib fracture  . Diabetes Father   . Diabetes Sister   . Cancer Brother        brain, stomach  . Colon cancer Neg Hx     Past Surgical History:  Procedure Laterality Date  . ABDOMINAL HYSTERECTOMY  1980   total; due to heavy bleeding  . APPENDECTOMY  1980  . KNEE SURGERY     right  . SPINE SURGERY     lumbar  . TONSILLECTOMY    . TUBAL LIGATION     Social History   Occupational History  . Not on file.   Social History Main Topics  . Smoking status: Current Every Day Smoker    Packs/day: 0.50    Years: 40.00    Types: Cigarettes  . Smokeless tobacco: Never Used  . Alcohol use No  . Drug use: No  . Sexual activity: Not on file

## 2017-01-29 ENCOUNTER — Other Ambulatory Visit (INDEPENDENT_AMBULATORY_CARE_PROVIDER_SITE_OTHER): Payer: Self-pay

## 2017-01-29 ENCOUNTER — Telehealth (INDEPENDENT_AMBULATORY_CARE_PROVIDER_SITE_OTHER): Payer: Self-pay

## 2017-01-29 DIAGNOSIS — G8929 Other chronic pain: Secondary | ICD-10-CM

## 2017-01-29 DIAGNOSIS — M25511 Pain in right shoulder: Principal | ICD-10-CM

## 2017-01-29 NOTE — Telephone Encounter (Signed)
I called pt to schedule her nerve study and she states she was supposed to be set up for MRI also. I advised her that sometimes it takes a couple of days to get scheduled but then after looking Didn't see an order for this. Can you please look behind me?

## 2017-01-29 NOTE — Telephone Encounter (Signed)
Thank you :)

## 2017-01-30 ENCOUNTER — Telehealth: Payer: Self-pay | Admitting: Medical

## 2017-01-30 NOTE — Telephone Encounter (Signed)
Get her in at her convenience to discuss findings on recent scan where she saw specialist.

## 2017-01-31 NOTE — Telephone Encounter (Signed)
Pt called and she said that she has an appt with physician on next friday  That order the scan they have already set her up nerve condition. I explain to the pt that shane needed to see her as will , she said that  Will just follow with other physician.

## 2017-01-31 NOTE — Telephone Encounter (Signed)
Forwarding to you since I am not for sure what kind of scan she had that way if she has questions

## 2017-02-07 ENCOUNTER — Ambulatory Visit (INDEPENDENT_AMBULATORY_CARE_PROVIDER_SITE_OTHER): Payer: Medicare Other | Admitting: Physical Medicine and Rehabilitation

## 2017-02-07 DIAGNOSIS — R202 Paresthesia of skin: Secondary | ICD-10-CM | POA: Diagnosis not present

## 2017-02-07 NOTE — Progress Notes (Deleted)
Pain, Numbness, and tingling in right hand. Decreased strength. Right hand dominant

## 2017-02-10 ENCOUNTER — Telehealth: Payer: Self-pay | Admitting: Medical

## 2017-02-10 ENCOUNTER — Other Ambulatory Visit: Payer: Self-pay

## 2017-02-10 MED ORDER — GABAPENTIN 100 MG PO CAPS: 100.0000 mg | ORAL_CAPSULE | Freq: Every day | ORAL | 2 refills | Status: DC

## 2017-02-10 NOTE — Progress Notes (Signed)
Alyssa Carter - 71 y.o. female MRN [id number]  Date of birth: 02-16-1946  Office Visit Note: Visit Date: 02/07/2017 PCP: Carlena Hurl, PA-C Referred by: Carlena Hurl, PA-C  Subjective: Chief Complaint  Patient presents with  . Right Hand - Pain, Numbness   HPI: Mrs. Zeimet is a pleasant 71 year old right-hand dominant female with chronic worsening hand pain particularly with numbness tingling in the radial digits predominantly but sometimes the whole hand. She does report decreased strength at times. She endorses sometimes it is worse at night and with position. She has failed conservative care to Dr. Durward Fortes who request of electrodiagnostic studies of the hand. She has not had prior electrodiagnostic studies. She has not had prior carpal tunnel release.    ROS Otherwise per HPI.  Assessment & Plan: Visit Diagnoses:  1. Paresthesia of skin     Plan: No additional findings.  Impression: The above electrodiagnostic study is ABNORMAL and reveals evidence of a moderate median nerve entrapment at the wrist (carpal tunnel syndrome) affecting sensory and motor components.   There is no significant electrodiagnostic evidence of any other focal nerve entrapment, brachial plexopathy or cervical radiculopathy.    As you know, this particular electrodiagnostic study cannot rule out chemical radiculitis or sensory only radiculopathy.  Recommendations: 1.  Follow-up with referring physician. 2.  Continue current management of symptoms. 3.  Continue use of resting splint at night-time and as needed during the day. 4.  Suggest surgical evaluation.    Meds & Orders: No orders of the defined types were placed in this encounter.   Orders Placed This Encounter  Procedures  . NCV with EMG (electromyography)    Follow-up: Return for Dr. Durward Fortes.   Procedures: No procedures performed  Impression: The above electrodiagnostic study is ABNORMAL and reveals evidence of a  moderate median nerve entrapment at the wrist (carpal tunnel syndrome) affecting sensory and motor components.   There is no significant electrodiagnostic evidence of any other focal nerve entrapment, brachial plexopathy or cervical radiculopathy.    As you know, this particular electrodiagnostic study cannot rule out chemical radiculitis or sensory only radiculopathy.  Recommendations: 1.  Follow-up with referring physician. 2.  Continue current management of symptoms. 3.  Continue use of resting splint at night-time and as needed during the day. 4.  Suggest surgical evaluation.   Nerve Conduction Studies Anti Sensory Summary Table   Stim Site NR Peak (ms) Norm Peak (ms) P-T Amp (V) Norm P-T Amp Site1 Site2 Delta-P (ms) Dist (cm) Vel (m/s) Norm Vel (m/s)  Right Median Acr Palm Anti Sensory (2nd Digit)  32.5C  Wrist    *4.8 <3.6 10.9 >10 Wrist Palm 0.5 0.0    Palm    *4.3 <2.0 24.1         Right Radial Anti Sensory (Base 1st Digit)  32.4C  Wrist    1.8 <3.1 35.4  Wrist Base 1st Digit 1.8 0.0    Right Ulnar Anti Sensory (5th Digit)  32.7C  Wrist    3.2 <3.7 27.4 >15.0 Wrist 5th Digit 3.2 14.0 44 >38   Motor Summary Table   Stim Site NR Onset (ms) Norm Onset (ms) O-P Amp (mV) Norm O-P Amp Site1 Site2 Delta-0 (ms) Dist (cm) Vel (m/s) Norm Vel (m/s)  Right Median Motor (Abd Poll Brev)  32.4C  Wrist    *4.8 <4.2 7.5 >5 Elbow Wrist 4.6 22.5 *49 >50  Elbow    9.4  2.1  Axilla Elbow 0.9 0.0  Axilla    8.5  1.2         Right Ulnar Motor (Abd Dig Min)  32.4C  Wrist    2.4 <4.2 9.2 >3 B Elbow Wrist 3.1 19.5 63 >53  B Elbow    5.5  9.0  A Elbow B Elbow 1.3 10.0 77 >53  A Elbow    6.8  8.9          EMG   Side Muscle Nerve Root Ins Act Fibs Psw Amp Dur Poly Recrt Int Fraser Din Comment  Right Abd Poll Brev Median C8-T1 Nml Nml Nml Nml Nml 0 Nml Nml   Right 1stDorInt Ulnar C8-T1 Nml Nml Nml Nml Nml 0 Nml Nml   Right PronatorTeres Median C6-7 Nml Nml Nml Nml Nml 0 Nml Nml   Right Biceps  Musculocut C5-6 Nml Nml Nml Nml Nml 0 Nml Nml   Right Deltoid Axillary C5-6 Nml Nml Nml Nml Nml 0 Nml Nml     Nerve Conduction Studies Anti Sensory Left/Right Comparison   Stim Site L Lat (ms) R Lat (ms) L-R Lat (ms) L Amp (V) R Amp (V) L-R Amp (%) Site1 Site2 L Vel (m/s) R Vel (m/s) L-R Vel (m/s)  Median Acr Palm Anti Sensory (2nd Digit)  32.5C  Wrist  *4.8   10.9  Wrist Palm     Palm  *4.3   24.1        Radial Anti Sensory (Base 1st Digit)  32.4C  Wrist  1.8   35.4  Wrist Base 1st Digit     Ulnar Anti Sensory (5th Digit)  32.7C  Wrist  3.2   27.4  Wrist 5th Digit  44    Motor Left/Right Comparison   Stim Site L Lat (ms) R Lat (ms) L-R Lat (ms) L Amp (mV) R Amp (mV) L-R Amp (%) Site1 Site2 L Vel (m/s) R Vel (m/s) L-R Vel (m/s)  Median Motor (Abd Poll Brev)  32.4C  Wrist  *4.8   7.5  Elbow Wrist  *49   Elbow  9.4   2.1  Axilla Elbow     Axilla  8.5   1.2        Ulnar Motor (Abd Dig Min)  32.4C  Wrist  2.4   9.2  B Elbow Wrist  63   B Elbow  5.5   9.0  A Elbow B Elbow  77   A Elbow  6.8   8.9              Clinical History: No specialty comments available.  She reports that she has been smoking Cigarettes.  She has a 20.00 pack-year smoking history. She has never used smokeless tobacco.   Recent Labs  01/08/17 1007  HGBA1C 5.5    Objective:  VS:  HT:    WT:   BMI:     BP:   HR: bpm  TEMP: ( )  RESP:  Physical Exam  Musculoskeletal:  Inspection reveals some osteoarthritic changes but no atrophy of the bilateral APB or FDI or hand intrinsics. There is no swelling, color changes, allodynia or dystrophic changes. There is 5 out of 5 strength in the bilateral wrist extension, finger abduction and long finger flexion. There is intact sensation to light touch in all dermatomal and peripheral nerve distributions.  There is a negative Hoffmann's test bilaterally.    Ortho Exam Imaging: No results found.  Past Medical/Family/Surgical/Social History: Medications &  Allergies reviewed per EMR Patient Active Problem  List   Diagnosis Date Noted  . Screening for breast cancer 01/08/2017  . Estrogen deficiency 01/08/2017  . PVD (peripheral vascular disease) ([place]) 01/08/2017  . Chronic right shoulder pain 01/08/2017  . Paresthesia of both lower extremities 05/22/2015  . Essential hypertension 05/22/2015  . Mixed dyslipidemia 05/22/2015  . History of back surgery 05/22/2015  . Screening for cancer 05/22/2015  . Vaccine counseling 05/22/2015  . Tobacco use disorder 03/30/2014   Past Medical History:  Diagnosis Date  . Dyslipidemia   . Full dentures   . H/O bone density study 8/14   never  . H/O mammogram 2010  . Hematuria    microscopic, several prior evaluations, no source or cause found  . Hyperlipidemia   . Hypertension   . Impaired fasting blood sugar 2008  . Influenza vaccine side effect    intolerance, declines  . Tobacco use   . Wears glasses    Family History  Problem Relation Age of Onset  . Alzheimer's disease Mother   . Other Mother        died of UTI, dehydration  . Other Father 61       failure to thrive, old age, fall and rib fracture  . Diabetes Father   . Diabetes Sister   . Cancer Brother        brain, stomach  . Colon cancer Neg Hx    Past Surgical History:  Procedure Laterality Date  . ABDOMINAL HYSTERECTOMY  1980   total; due to heavy bleeding  . APPENDECTOMY  1980  . KNEE SURGERY     right  . SPINE SURGERY     lumbar  . TONSILLECTOMY    . TUBAL LIGATION     Social History   Occupational History  . Not on file.   Social History Main Topics  . Smoking status: Current Every Day Smoker    Packs/day: 0.50    Years: 40.00    Types: Cigarettes  . Smokeless tobacco: Never Used  . Alcohol use No  . Drug use: No  . Sexual activity: Not on file

## 2017-02-10 NOTE — Telephone Encounter (Signed)
Sent refill to pharmacy. 

## 2017-02-10 NOTE — Procedures (Signed)
Impression: The above electrodiagnostic study is ABNORMAL and reveals evidence of a moderate median nerve entrapment at the wrist (carpal tunnel syndrome) affecting sensory and motor components.   There is no significant electrodiagnostic evidence of any other focal nerve entrapment, brachial plexopathy or cervical radiculopathy.    As you know, this particular electrodiagnostic study cannot rule out chemical radiculitis or sensory only radiculopathy.  Recommendations: 1.  Follow-up with referring physician. 2.  Continue current management of symptoms. 3.  Continue use of resting splint at night-time and as needed during the day. 4.  Suggest surgical evaluation.   Nerve Conduction Studies Anti Sensory Summary Table   Stim Site NR Peak (ms) Norm Peak (ms) P-T Amp (V) Norm P-T Amp Site1 Site2 Delta-P (ms) Dist (cm) Vel (m/s) Norm Vel (m/s)  Right Median Acr Palm Anti Sensory (2nd Digit)  32.5C  Wrist    *4.8 <3.6 10.9 >10 Wrist Palm 0.5 0.0    Palm    *4.3 <2.0 24.1         Right Radial Anti Sensory (Base 1st Digit)  32.4C  Wrist    1.8 <3.1 35.4  Wrist Base 1st Digit 1.8 0.0    Right Ulnar Anti Sensory (5th Digit)  32.7C  Wrist    3.2 <3.7 27.4 >15.0 Wrist 5th Digit 3.2 14.0 44 >38   Motor Summary Table   Stim Site NR Onset (ms) Norm Onset (ms) O-P Amp (mV) Norm O-P Amp Site1 Site2 Delta-0 (ms) Dist (cm) Vel (m/s) Norm Vel (m/s)  Right Median Motor (Abd Poll Brev)  32.4C  Wrist    *4.8 <4.2 7.5 >5 Elbow Wrist 4.6 22.5 *49 >50  Elbow    9.4  2.1  Axilla Elbow 0.9 0.0    Axilla    8.5  1.2         Right Ulnar Motor (Abd Dig Min)  32.4C  Wrist    2.4 <4.2 9.2 >3 B Elbow Wrist 3.1 19.5 63 >53  B Elbow    5.5  9.0  A Elbow B Elbow 1.3 10.0 77 >53  A Elbow    6.8  8.9          EMG   Side Muscle Nerve Root Ins Act Fibs Psw Amp Dur Poly Recrt Int Fraser Din Comment  Right Abd Poll Brev Median C8-T1 Nml Nml Nml Nml Nml 0 Nml Nml   Right 1stDorInt Ulnar C8-T1 Nml Nml Nml Nml Nml 0 Nml  Nml   Right PronatorTeres Median C6-7 Nml Nml Nml Nml Nml 0 Nml Nml   Right Biceps Musculocut C5-6 Nml Nml Nml Nml Nml 0 Nml Nml   Right Deltoid Axillary C5-6 Nml Nml Nml Nml Nml 0 Nml Nml     Nerve Conduction Studies Anti Sensory Left/Right Comparison   Stim Site L Lat (ms) R Lat (ms) L-R Lat (ms) L Amp (V) R Amp (V) L-R Amp (%) Site1 Site2 L Vel (m/s) R Vel (m/s) L-R Vel (m/s)  Median Acr Palm Anti Sensory (2nd Digit)  32.5C  Wrist  *4.8   10.9  Wrist Palm     Palm  *4.3   24.1        Radial Anti Sensory (Base 1st Digit)  32.4C  Wrist  1.8   35.4  Wrist Base 1st Digit     Ulnar Anti Sensory (5th Digit)  32.7C  Wrist  3.2   27.4  Wrist 5th Digit  44    Motor Left/Right Comparison   Stim  Site L Lat (ms) R Lat (ms) L-R Lat (ms) L Amp (mV) R Amp (mV) L-R Amp (%) Site1 Site2 L Vel (m/s) R Vel (m/s) L-R Vel (m/s)  Median Motor (Abd Poll Brev)  32.4C  Wrist  *4.8   7.5  Elbow Wrist  *49   Elbow  9.4   2.1  Axilla Elbow     Axilla  8.5   1.2        Ulnar Motor (Abd Dig Min)  32.4C  Wrist  2.4   9.2  B Elbow Wrist  63   B Elbow  5.5   9.0  A Elbow B Elbow  77   A Elbow  6.8   8.9

## 2017-02-10 NOTE — Telephone Encounter (Signed)
Recv'd fax requesting 90 days refill for Gabapentin 100mg  #90 to [facility]

## 2017-02-10 NOTE — Telephone Encounter (Signed)
pls refill 

## 2017-02-14 ENCOUNTER — Ambulatory Visit
Admission: RE | Admit: 2017-02-14 | Discharge: 2017-02-14 | Disposition: A | Discharge status: Home / Self Care | Payer: Medicare Other | Source: Ambulatory Visit | Attending: Orthopaedic Surgery | Admitting: Orthopaedic Surgery

## 2017-02-14 DIAGNOSIS — M25511 Pain in right shoulder: Principal | ICD-10-CM

## 2017-02-14 DIAGNOSIS — G8929 Other chronic pain: Secondary | ICD-10-CM

## 2017-02-17 ENCOUNTER — Ambulatory Visit (INDEPENDENT_AMBULATORY_CARE_PROVIDER_SITE_OTHER): Payer: Medicare Other | Admitting: Orthopaedic Surgery

## 2017-02-17 DIAGNOSIS — G5601 Carpal tunnel syndrome, right upper limb: Secondary | ICD-10-CM | POA: Diagnosis not present

## 2017-02-17 DIAGNOSIS — M25511 Pain in right shoulder: Secondary | ICD-10-CM

## 2017-02-17 DIAGNOSIS — G8929 Other chronic pain: Secondary | ICD-10-CM

## 2017-02-17 NOTE — Progress Notes (Signed)
Office Visit Note   Patient: Alyssa Carter           Date of Birth: [birth year]           MRN: [id number] Visit Date: 02/17/2017              Requested by: Carlena Hurl, PA-C [address] [place], [place] [address] PCP: Carlena Hurl, PA-C   Assessment & Plan: Visit Diagnoses:  1. Carpal tunnel syndrome, right upper limb   2. Chronic right shoulder pain   ENG nerve conduction studies demonstrate moderate right carpal tunnel. MRI scan right shoulder demonstrated near complete supraspinatus tendon tear with moderate acromioclavicular joint degenerative changes  Plan: Alyssa Carter would like to proceed with a right carpal tunnel release. She presently is not as symptomatic in the right shoulder and would like to work with exercises. I've counseled her about the tenuous nature of the cuff tear on the right. We'll schedule right carpal tunnel surgery. I discussed that procedure in detail in terms of the outpatient nature, the anesthesia, the incision, and what she may or may not expect.  Follow-Up Instructions: Return will schedule carpal tunnel release.   Orders:  No orders of the defined types were placed in this encounter.  No orders of the defined types were placed in this encounter.     Procedures: No procedures performed   Clinical Data: No additional findings.   Subjective: Chief Complaint  Patient presents with  . Right Shoulder - Results  . Right Wrist - Pain, Carpal Tunnel    Alyssa Carter is a 71 y o that is here for MRI of R [place] results and R ENG/NCS  Alyssa Carter is experiencing carpal tunnel symptoms of the right hand and chronic right shoulder pain. She's had an MRI scan of her right shoulder demonstrating a near complete supraspinatus tendon tear. The tear is centered 1.5 cm medial to the greater tuberosity measures 1.6 cm from front to back the gap between the tendon fragments is about 1-1/2 cm. There is moderate to moderately severe  degenerative change at the acromioclavicular joint with a type II acromion. Subacromial spurring is identified. There were mild degenerative changes with a small osteophyte off the humeral head. In addition she has EMGs and nerve conduction studies demonstrating moderate median nerve entrapment at the right wrist consistent with carpal tunnel syndrome that affects both sensory and motor components. She is more symptomatic in the right hand with numbness and tingling  HPI  Review of Systems   Objective: Vital Signs: There were no vitals taken for this visit.  Physical Exam  Ortho Exam right hand with positive Phalen's and Tinel's consistent with carpal tunnel syndrome. Good opposition of thumb to little finger. Normal capillary refill. Right shoulder with full overhead motion. Some weakness with external rotation. Mild tenderness at the acromioclavicular joint and the anterior aspect of the shoulder. Biceps intact.  Specialty Comments:  No specialty comments available.  Imaging: No results found.   PMFS History: Patient Active Problem List   Diagnosis Date Noted  . Screening for breast cancer 01/08/2017  . Estrogen deficiency 01/08/2017  . PVD (peripheral vascular disease) ([place]) 01/08/2017  . Chronic right shoulder pain 01/08/2017  . Paresthesia of both lower extremities 05/22/2015  . Essential hypertension 05/22/2015  . Mixed dyslipidemia 05/22/2015  . History of back surgery 05/22/2015  . Screening for cancer 05/22/2015  . Vaccine counseling 05/22/2015  . Tobacco use disorder 03/30/2014   Past Medical History:  Diagnosis Date  . Dyslipidemia   . Full dentures   . H/O bone density study 8/14   never  . H/O mammogram 2010  . Hematuria    microscopic, several prior evaluations, no source or cause found  . Hyperlipidemia   . Hypertension   . Impaired fasting blood sugar 2008  . Influenza vaccine side effect    intolerance, declines  . Tobacco use   . Wears glasses       Family History  Problem Relation Age of Onset  . Alzheimer's disease Mother   . Other Mother        died of UTI, dehydration  . Other Father 81       failure to thrive, old age, fall and rib fracture  . Diabetes Father   . Diabetes Sister   . Cancer Brother        brain, stomach  . Colon cancer Neg Hx     Past Surgical History:  Procedure Laterality Date  . ABDOMINAL HYSTERECTOMY  1980   total; due to heavy bleeding  . APPENDECTOMY  1980  . KNEE SURGERY     right  . SPINE SURGERY     lumbar  . TONSILLECTOMY    . TUBAL LIGATION     Social History   Occupational History  . Not on file.   Social History Main Topics  . Smoking status: Current Every Day Smoker    Packs/day: 0.50    Years: 40.00    Types: Cigarettes  . Smokeless tobacco: Never Used  . Alcohol use No  . Drug use: No  . Sexual activity: Not on file     Garald Balding, MD   Note - This record has been created using [employer].  Chart creation errors have been sought, but may not always  have been located. Such creation errors do not reflect on  the standard of medical care.

## 2017-02-20 ENCOUNTER — Encounter (INDEPENDENT_AMBULATORY_CARE_PROVIDER_SITE_OTHER): Payer: Self-pay | Admitting: Orthopaedic Surgery

## 2017-03-04 ENCOUNTER — Telehealth: Payer: Self-pay | Admitting: Medical

## 2017-03-04 NOTE — Telephone Encounter (Signed)
This has been fixed.  

## 2017-03-04 NOTE — Addendum Note (Signed)
Addended by: Minette Headland A on: 03/04/2017 08:09 AM   Modules accepted: Miquel Dunn

## 2017-03-04 NOTE — Patient Instructions (Signed)
reviewed

## 2017-03-04 NOTE — Telephone Encounter (Signed)
There is an email message sent to me that I cant close or remove from in basket.  Can you help clear this out

## 2017-03-13 ENCOUNTER — Telehealth (INDEPENDENT_AMBULATORY_CARE_PROVIDER_SITE_OTHER): Payer: Self-pay | Admitting: Orthopaedic Surgery

## 2017-03-13 DIAGNOSIS — G5601 Carpal tunnel syndrome, right upper limb: Secondary | ICD-10-CM | POA: Diagnosis not present

## 2017-03-13 NOTE — Telephone Encounter (Signed)
Please advise 

## 2017-03-13 NOTE — Telephone Encounter (Signed)
Needs clarification on the patient's hydrocodone prescription.  [phone].  Thank you.

## 2017-03-17 ENCOUNTER — Ambulatory Visit (INDEPENDENT_AMBULATORY_CARE_PROVIDER_SITE_OTHER): Payer: Medicare Other | Admitting: Orthopaedic Surgery

## 2017-03-17 DIAGNOSIS — G5601 Carpal tunnel syndrome, right upper limb: Secondary | ICD-10-CM

## 2017-03-17 NOTE — Progress Notes (Signed)
Office Visit Note   Patient: Alyssa Carter           Date of Birth: 09-26-1945           MRN: [id number] Visit Date: 03/17/2017              Requested by: Carlena Hurl, PA-C [address] [place], [place] [address] PCP: Carlena Hurl, PA-C   Assessment & Plan: Visit Diagnoses:  1. Carpal tunnel syndrome, right upper limb   4 days status post right carpal tunnel release and doing very well. Not experiencing any numbness  Plan: Waterproof Band-Aid to incision, volar wrist splint, no work, office 1 week to remove stitches  Follow-Up Instructions: Return in about 1 week (around 03/24/2017).   Orders:  No orders of the defined types were placed in this encounter.  No orders of the defined types were placed in this encounter.     Procedures: No procedures performed   Clinical Data: No additional findings.   Subjective: No chief complaint on file. Alyssa Carter is 4 days status post right carpal tunnel release and doing well. She hasn't taken any pain pills. She's not had any numbness or tingling. Denies fever  or chills  HPI  Review of Systems   Objective: Vital Signs: There were no vitals taken for this visit.  Physical Exam  Ortho Exam carpal tunnel incision healing without any problems. Neurovascular exam intact. Normal range of motion of fingers  Specialty Comments:  No specialty comments available.  Imaging: No results found.   PMFS History: Patient Active Problem List   Diagnosis Date Noted  . Screening for breast cancer 01/08/2017  . Estrogen deficiency 01/08/2017  . PVD (peripheral vascular disease) ([place]) 01/08/2017  . Chronic right shoulder pain 01/08/2017  . Paresthesia of both lower extremities 05/22/2015  . Essential hypertension 05/22/2015  . Mixed dyslipidemia 05/22/2015  . History of back surgery 05/22/2015  . Screening for cancer 05/22/2015  . Vaccine counseling 05/22/2015  . Tobacco use disorder 03/30/2014   Past  Medical History:  Diagnosis Date  . Dyslipidemia   . Full dentures   . H/O bone density study 8/14   never  . H/O mammogram 2010  . Hematuria    microscopic, several prior evaluations, no source or cause found  . Hyperlipidemia   . Hypertension   . Impaired fasting blood sugar 2008  . Influenza vaccine side effect    intolerance, declines  . Tobacco use   . Wears glasses     Family History  Problem Relation Age of Onset  . Alzheimer's disease Mother   . Other Mother        died of UTI, dehydration  . Other Father 9       failure to thrive, old age, fall and rib fracture  . Diabetes Father   . Diabetes Sister   . Cancer Brother        brain, stomach  . Colon cancer Neg Hx     Past Surgical History:  Procedure Laterality Date  . ABDOMINAL HYSTERECTOMY  1980   total; due to heavy bleeding  . APPENDECTOMY  1980  . KNEE SURGERY     right  . SPINE SURGERY     lumbar  . TONSILLECTOMY    . TUBAL LIGATION     Social History   Occupational History  . Not on file.   Social History Main Topics  . Smoking status: Current Every Day Smoker  Packs/day: 0.50    Years: 40.00    Types: Cigarettes  . Smokeless tobacco: Never Used  . Alcohol use No  . Drug use: No  . Sexual activity: Not on file     Garald Balding, MD   Note - This record has been created using [employer].  Chart creation errors have been sought, but may not always  have been located. Such creation errors do not reflect on  the standard of medical care.

## 2017-03-25 ENCOUNTER — Ambulatory Visit (INDEPENDENT_AMBULATORY_CARE_PROVIDER_SITE_OTHER): Payer: Medicare Other | Admitting: Orthopaedic Surgery

## 2017-04-21 ENCOUNTER — Ambulatory Visit (INDEPENDENT_AMBULATORY_CARE_PROVIDER_SITE_OTHER): Payer: Medicare Other | Admitting: Orthopaedic Surgery

## 2017-04-21 ENCOUNTER — Encounter (INDEPENDENT_AMBULATORY_CARE_PROVIDER_SITE_OTHER): Payer: Self-pay | Admitting: Orthopaedic Surgery

## 2017-04-21 VITALS — BP 118/64 | HR 90 | Resp 14 | Ht 63.0 in | Wt 165.0 lb

## 2017-04-21 DIAGNOSIS — G5601 Carpal tunnel syndrome, right upper limb: Secondary | ICD-10-CM

## 2017-04-21 NOTE — Progress Notes (Signed)
Office Visit Note   Patient: Alyssa Carter           Date of Birth: 03-Jul-1946           MRN: [id number] Visit Date: 04/21/2017              Requested by: Carlena Hurl, PA-C [address] [place], [place] [address] PCP: Carlena Hurl, PA-C   Assessment & Plan: Visit Diagnoses:  1. Carpal tunnel syndrome, right upper limb   5 weeks status post right carpal tunnel release and doing very well. No longer has any the preoperative symptoms  Plan: Continue to massage the scar with aloe or vitamin E. Continue strengthening exercises. Return as needed  Follow-Up Instructions: Return if symptoms worsen or fail to improve.   Orders:  No orders of the defined types were placed in this encounter.  No orders of the defined types were placed in this encounter.     Procedures: No procedures performed   Clinical Data: No additional findings.   Subjective: Chief Complaint  Patient presents with  . Right Wrist - Routine Post Op    Alyssa Carter is apprx. 6 weeks status post R RCT release surgery. SHe relates there is tingling at incision site that is clean dry and intact.  Alyssa Carter relates that she's doing very well without any the preoperative symptoms. She is very happy.  HPI  Review of Systems  Constitutional: Negative for chills, fatigue and fever.  Eyes: Negative for itching.  Respiratory: Negative for chest tightness and shortness of breath.   Cardiovascular: Negative for chest pain, palpitations and leg swelling.  Gastrointestinal: Negative for blood in stool, constipation and diarrhea.  Musculoskeletal: Negative for back pain, joint swelling, neck pain and neck stiffness.  Neurological: Negative for dizziness, weakness, numbness and headaches.  Hematological: Does not bruise/bleed easily.  Psychiatric/Behavioral: Negative for sleep disturbance. The patient is not nervous/anxious.   All other systems reviewed and are negative.    Objective: Vital  Signs: BP 118/64   Pulse 90   Resp 14   Ht 5\' 3"  (1.6 m)   Wt 165 lb (74.8 kg)   BMI 29.23 kg/m   Physical Exam  Ortho Exam incision right wrist is healed very nicely with the exception of a small thickened area very proximally consistent with scar that. That should resolve over time I like her to continue with the massage. Neurovascular exam is intact. Normal opposition of thumb to little finger. Good grip strength. Full range of motion of fingers. No swelling  Specialty Comments:  No specialty comments available.  Imaging: No results found.   PMFS History: Patient Active Problem List   Diagnosis Date Noted  . Screening for breast cancer 01/08/2017  . Estrogen deficiency 01/08/2017  . PVD (peripheral vascular disease) ([name]) 01/08/2017  . Chronic right shoulder pain 01/08/2017  . Paresthesia of both lower extremities 05/22/2015  . Essential hypertension 05/22/2015  . Mixed dyslipidemia 05/22/2015  . History of back surgery 05/22/2015  . Screening for cancer 05/22/2015  . Vaccine counseling 05/22/2015  . Tobacco use disorder 03/30/2014   Past Medical History:  Diagnosis Date  . Dyslipidemia   . Full dentures   . H/O bone density study 8/14   never  . H/O mammogram 2010  . Hematuria    microscopic, several prior evaluations, no source or cause found  . Hyperlipidemia   . Hypertension   . Impaired fasting blood sugar 2008  . Influenza vaccine side effect  intolerance, declines  . Tobacco use   . Wears glasses     Family History  Problem Relation Age of Onset  . Alzheimer's disease Mother   . Other Mother        died of UTI, dehydration  . Other Father 28       failure to thrive, old age, fall and rib fracture  . Diabetes Father   . Diabetes Sister   . Cancer Brother        brain, stomach  . Colon cancer Neg Hx     Past Surgical History:  Procedure Laterality Date  . ABDOMINAL HYSTERECTOMY  1980   total; due to heavy bleeding  . APPENDECTOMY  1980  .  KNEE SURGERY     right  . SPINE SURGERY     lumbar  . TONSILLECTOMY    . TUBAL LIGATION     Social History   Occupational History  . Not on file.   Social History Main Topics  . Smoking status: Current Every Day Smoker    Packs/day: 0.50    Years: 40.00    Types: Cigarettes  . Smokeless tobacco: Never Used  . Alcohol use No  . Drug use: No  . Sexual activity: Not on file

## 2017-06-16 DIAGNOSIS — Z23 Encounter for immunization: Secondary | ICD-10-CM | POA: Diagnosis not present

## 2017-07-29 ENCOUNTER — Ambulatory Visit (INDEPENDENT_AMBULATORY_CARE_PROVIDER_SITE_OTHER): Payer: Medicare Other | Admitting: Family Medicine

## 2017-07-29 ENCOUNTER — Encounter: Payer: Self-pay | Admitting: Family Medicine

## 2017-07-29 VITALS — BP 124/68 | HR 88 | Wt 165.6 lb

## 2017-07-29 DIAGNOSIS — M79632 Pain in left forearm: Secondary | ICD-10-CM | POA: Diagnosis not present

## 2017-07-29 DIAGNOSIS — M7712 Lateral epicondylitis, left elbow: Secondary | ICD-10-CM

## 2017-07-29 NOTE — Patient Instructions (Addendum)
Use ice to the area for 15-20 minutes three to four times daily.  Take 2 Aleve twice daily with food for the next 4-5 days.  Try keeping your elbow and wrist in neutral position.  Follow up if you are not improving in the next week.  We may send you for an XR at that time if not improving or have you follow up with your orthopedist.   Tennis Elbow Tennis elbow (lateral epicondylitis) is inflammation of the outer tendons of your forearm close to your elbow. Your tendons attach your muscles to your bones. The outer tendons of your forearm are used to extend your wrist, and they attach on the outside part of your elbow. Tennis elbow is often found in people who play tennis, but anyone may get the condition from repeatedly extending the wrist or turning the forearm. What are the causes? This condition is caused by repeatedly extending your wrist and using your hands. It can result from sports or work that requires repetitive forearm movements. Tennis elbow may also be caused by an injury. What increases the risk? You have a higher risk of developing tennis elbow if you play tennis or another racquet sport. You also have a higher risk if you frequently use your hands for work. This condition is also more likely to develop in:  Musicians.  Carpenters, painters, and plumbers.  Cooks.  Cashiers.  People who work in [employer].  Architect workers.  Butchers.  People who use computers.  What are the signs or symptoms? Symptoms of this condition include:  Pain and tenderness in your forearm and the outer part of your elbow. You may only feel the pain when you use your arm, or you may feel it even when you are not using your arm.  A burning feeling that runs from your elbow through your arm.  Weak grip in your hands.  How is this diagnosed? This condition may be diagnosed by medical history and physical exam. You may also have other tests, including:  X-rays.  MRI.  How is this  treated? Your health care provider will recommend lifestyle adjustments, such as resting and icing your arm. Treatment may also include:  Medicines for inflammation. This may include shots of cortisone if your pain continues.  Physical therapy. This may include massage or exercises.  An elbow brace.  Surgery may eventually be recommended if your pain does not go away with treatment. Follow these instructions at home: Activity  Rest your elbow and wrist as directed by your health care provider. Try to avoid any activities that caused the problem until your health care provider says that you can do them again.  If a physical therapist teaches you exercises, do all of them as directed.  If you lift an object, lift it with your palm facing upward. This lowers the stress on your elbow. Lifestyle  If your tennis elbow is caused by sports, check your equipment and make sure that: ? You are using it correctly. ? It is the best fit for you.  If your tennis elbow is caused by work, take breaks frequently, if you are able. Talk with your manager about how to best perform tasks in a way that is safe. ? If your tennis elbow is caused by computer use, talk with your manager about any changes that can be made to your work environment. General instructions  If directed, apply ice to the painful area: ? Put ice in a plastic bag. ? Place a  towel between your skin and the bag. ? Leave the ice on for 20 minutes, 2-3 times per day.  Take medicines only as directed by your health care provider.  If you were given a brace, wear it as directed by your health care provider.  Keep all follow-up visits as directed by your health care provider. This is important. Contact a health care provider if:  Your pain does not get better with treatment.  Your pain gets worse.  You have numbness or weakness in your forearm, hand, or fingers. This information is not intended to replace advice given to you by  your health care provider. Make sure you discuss any questions you have with your health care provider. Document Released: 08/26/2005 Document Revised: 04/25/2016 Document Reviewed: 08/22/2014 Elsevier Interactive Patient Education  [employer].

## 2017-07-29 NOTE — Progress Notes (Signed)
   Subjective:    Patient ID: Alyssa Carter, female    DOB: 06-25-1946, 71 y.o.   MRN: [id number]  HPI Chief Complaint  Patient presents with  . last 3 nights left arm going to sleep to the nails    Elbow down to hand   the other shoulder has rotator cuff   She is here with complaints of a 3 day history of left forearm pain and intermittent hand numbness and tingling mainly at night. Denies injury. No weakness.  States she is using her left arm more due to right rotator cuff issues. She has been under the care of Dr. Durward Fortes, orthopedist, for right carpal tunnel surgery and right rotator cuff pain.  No other concerns today.   Denies fever, chills, chest pain, palpitations, shortness of breath, abdominal pain, N/V/D.   Reviewed allergies, medications, past medical, surgical, and social history.  Review of Systems Pertinent positives and negatives in the history of present illness.     Objective:   Physical Exam BP 124/68   Pulse 88   Wt 165 lb 9.6 oz (75.1 kg)   BMI 29.33 kg/m   Left elbow with normal ROM. Tenderness approximately 1 cm distal to lateral epicondyle. Increased pain with wrist extension and supination against resistance.  LUE with normal sensation and motor function. No weakness. Equal grip strength.     Assessment & Plan:  Lateral epicondylitis of left elbow  Left forearm pain  Discussed conservative treatment with ice, NSAIDs and rest. She will follow up if not improving.

## 2017-08-05 ENCOUNTER — Encounter: Payer: Self-pay | Admitting: Family Medicine

## 2017-08-05 ENCOUNTER — Ambulatory Visit (INDEPENDENT_AMBULATORY_CARE_PROVIDER_SITE_OTHER): Payer: Medicare Other | Admitting: Family Medicine

## 2017-08-05 VITALS — BP 112/72 | HR 88 | Wt 166.0 lb

## 2017-08-05 DIAGNOSIS — M7712 Lateral epicondylitis, left elbow: Secondary | ICD-10-CM

## 2017-08-05 DIAGNOSIS — M7701 Medial epicondylitis, right elbow: Secondary | ICD-10-CM | POA: Diagnosis not present

## 2017-08-05 MED ORDER — GABAPENTIN 100 MG PO CAPS: 100.0000 mg | ORAL_CAPSULE | Freq: Every day | ORAL | 0 refills | Status: DC

## 2017-08-05 NOTE — Progress Notes (Signed)
   Subjective:    Patient ID: Alyssa Carter, female    DOB: 1946-06-28, 71 y.o.   MRN: [id number]  HPI Chief Complaint  Patient presents with  . follow up, pain is worse going to sleep, needles in fingers   She is here with complaints of worsening right lateral elbow and forearm pain and now states she is having numbness and tingling from her medial elbow down to her fingertips. She cannot discern which fingers are involved, states all of them are tingling.   States she has tried Aleve, ice, rest and no improvement. States she took 2 hydrocodone last night for pain relief.  Denies fever, chills, N/V.    Review of Systems Pertinent positives and negatives in the history of present illness.     Objective:   Physical Exam BP 112/72   Pulse 88   Wt 166 lb (75.3 kg)   SpO2 98%   BMI 29.41 kg/m   Left elbow with normal ROM. Tenderness approximately 1 cm distal to lateral epicondyle. Tenderness also to medial epicondylitis. Positive tingling and pain sensation elicited with ulnar nerve compression. Increased pain with wrist extension and supination against resistance.  LUE with normal sensation and motor function. No weakness. Equal grip strength.      Assessment & Plan:  Medial epicondylitis of elbow, right  Lateral epicondylitis, left elbow  Refilled gabapentin. Recommend keeping her arm straight when sleeping, driving and as often as possible. She can use splint she has at home or make one with a magazine and ace wrap. Continue conservative treatment and follow up with her orthopedist later this week.

## 2017-08-05 NOTE — Patient Instructions (Addendum)
Keep your left arm as straight as possible when sleeping, driving and all activities. Avoid compressing your left elbow. Continue on Aleve, if you are not taking the gabapentin, start back.   Follow up with [facility] ortho as scheduled.

## 2017-08-14 ENCOUNTER — Ambulatory Visit
Admission: RE | Admit: 2017-08-14 | Discharge: 2017-08-14 | Disposition: A | Discharge status: Home / Self Care | Payer: Medicare Other | Source: Ambulatory Visit | Attending: Medical | Admitting: Medical

## 2017-08-14 DIAGNOSIS — Z129 Encounter for screening for malignant neoplasm, site unspecified: Secondary | ICD-10-CM

## 2017-08-14 DIAGNOSIS — M810 Age-related osteoporosis without current pathological fracture: Secondary | ICD-10-CM | POA: Diagnosis not present

## 2017-08-14 DIAGNOSIS — Z1231 Encounter for screening mammogram for malignant neoplasm of breast: Secondary | ICD-10-CM | POA: Diagnosis not present

## 2017-08-14 DIAGNOSIS — Z1239 Encounter for other screening for malignant neoplasm of breast: Secondary | ICD-10-CM

## 2017-08-14 DIAGNOSIS — E2839 Other primary ovarian failure: Secondary | ICD-10-CM

## 2017-08-14 DIAGNOSIS — Z780 Asymptomatic menopausal state: Secondary | ICD-10-CM | POA: Diagnosis not present

## 2017-08-15 ENCOUNTER — Ambulatory Visit (INDEPENDENT_AMBULATORY_CARE_PROVIDER_SITE_OTHER): Payer: Medicare Other | Admitting: Orthopaedic Surgery

## 2017-08-15 ENCOUNTER — Encounter (INDEPENDENT_AMBULATORY_CARE_PROVIDER_SITE_OTHER): Payer: Self-pay | Admitting: Orthopaedic Surgery

## 2017-08-15 VITALS — BP 147/69 | HR 96 | Resp 14 | Ht 62.0 in | Wt 165.0 lb

## 2017-08-15 DIAGNOSIS — M25522 Pain in left elbow: Secondary | ICD-10-CM

## 2017-08-15 DIAGNOSIS — M25511 Pain in right shoulder: Secondary | ICD-10-CM

## 2017-08-15 DIAGNOSIS — G8929 Other chronic pain: Secondary | ICD-10-CM | POA: Diagnosis not present

## 2017-08-15 NOTE — Progress Notes (Signed)
Office Visit Note   Patient: Alyssa Carter           Date of Birth: [birth year]           MRN: [id number] Visit Date: 08/15/2017              Requested by: Carlena Hurl, PA-C [address] [place], [place] [address] PCP: Carlena Hurl, PA-C   Assessment & Plan: Visit Diagnoses:  1. Chronic right shoulder pain   2. Pain in left elbow   Rotator cuff tear right shoulder per prior MRI scan. Left elbow pain appears to be related to irritation of the ulnar nerve which she is improving. 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 Presently having very few symptoms. Think his problem is with her right shoulder. MRI scan this past summer demonstrates complete tear of the supraspinatus. Long discussion regarding different treatment options for her shoulder and her elbow. I believe the best approach for her shoulder which is the most symptomatic. Would be surgical reconstruction I discussed that with her in some detail and she wants to wait for the year. She'll let me know.  Follow-Up Instructions: Return if symptoms worsen or fail to improve.   Orders:  No orders of the defined types were placed in this encounter.  No orders of the defined types were placed in this encounter.     Procedures: No procedures performed   Clinical Data: No additional findings.   Subjective: Chief Complaint  Patient presents with  . Left Elbow - Pain, Numbness    Ms. Kehm is a 71 y o here today for Left elbow pain, tennis elbow dx from PCP x 2 weeks. Denies injury    HPI  Review of Systems  Constitutional: Negative for chills, fatigue and fever.  HENT: Positive for tinnitus.   Eyes: Negative for itching.  Respiratory: Negative for chest tightness and shortness of breath.   Cardiovascular: Negative for chest pain, palpitations and leg swelling.  Gastrointestinal: Negative for blood in stool,  constipation and diarrhea.  Endocrine: Negative for polyuria.  Genitourinary: Negative for dysuria.  Musculoskeletal: Negative for back pain, joint swelling, neck pain and neck stiffness.  Allergic/Immunologic: Negative for immunocompromised state.  Neurological: Negative for dizziness and numbness.  Hematological: Does not bruise/bleed easily.  Psychiatric/Behavioral: Positive for sleep disturbance. The patient is not nervous/anxious.      Objective: Vital Signs: BP (!) 147/69   Pulse 96   Resp 14   Ht 5\' 2"  (1.575 m)   Wt 165 lb (74.8 kg)   BMI 30.18 kg/m   Physical Exam  Ortho Exam right shoulder with positive impingement and empty can testing. Able to slowly raise her right arm over her head and abduct but with a painful arc of motion. Good strength. Neurovascular exam intact. Some tenderness along the anterior lateral subacromial region. Left elbow exam reveals some Tinel's over the ulnar nerve. Neurovascular exam is intact. No pain over the medial lateral epicondyles today. Full range of motion of pronation supination flexion and extension. Skin intact mild discomfort over the median nerve at the wrist. Had prior right carpal tunnel release with good results. Nerve conduction studies performed only on the right upper extremity. Certainly could have some carpal tunnel symptoms on the left in addition to mild ulnar neuropathy  Specialty Comments:  No specialty comments available.  Imaging: Dg Bone Density  Result Date: 08/14/2017  EXAM: DUAL X-RAY ABSORPTIOMETRY (DXA) FOR BONE MINERAL DENSITY IMPRESSION: Referring Physician:  DAVID Madelyn Brunner PATIENT: Name: Chandni, Gagan Patient ID: [id number] Birth Date: 1945-09-15 Height: 62.2 in. Sex: Female Measured: 08/14/2017 Weight: 162.0 lbs. Indications: Caucasian, Estrogen Deficient, Height Loss (781.91), Postmenopausal, Tobacco User (Current Smoker) Fractures: clavicle Treatments: Multivitamin ASSESSMENT: The BMD measured at Femur  Neck Right is 0.599 g/cm2 with a T-score of -3.2. This patient is considered osteoporotic according to [facility]) criteria. Lumbar spine was not utilized due to advanced degenerative changes. Patient does not meet criteria for FRAX assessment. Site Region Measured Date Measured Age YA BMD Significant CHANGE T-score DualFemur Neck Right 08/14/2017 69.3 -3.2 0.599 g/cm2 Left Forearm Radius 33% 08/14/2017 69.3 -1.1 0.789 g/cm2 World Health Organization [facility]) criteria for post-menopausal, Caucasian Women: Normal       T-score at or above -1 SD Osteopenia   T-score between -1 and -2.5 SD Osteoporosis T-score at or below -2.5 SD RECOMMENDATION: [place] recommends that FDA-approved medical therapies be considered in postmenopausal women and men age 11 or older with a: 1. Hip or vertebral (clinical or morphometric) fracture. 2. T-score of <-2.5 at the spine or hip. 3. Ten-year fracture probability by FRAX of 3% or greater for hip fracture or 20% or greater for major osteoporotic fracture. All treatment decisions require clinical judgment and consideration of individual patient factors, including patient preferences, co-morbidities, previous drug use, risk factors not captured in the FRAX model (e.g. falls, vitamin D deficiency, increased bone turnover, interval significant decline in bone density) and possible under - or over-estimation of fracture risk by FRAX. All patients should ensure an adequate intake of dietary calcium (1200 mg/d) and vitamin D (800 IU daily) unless contraindicated. FOLLOW-UP: People with diagnosed cases of osteoporosis or at high risk for fracture should have regular bone mineral density tests. For patients eligible for Medicare, routine testing is allowed once every 2 years. The testing frequency can be increased to one year for patients who have rapidly progressing disease, those who are receiving or discontinuing medical therapy to restore bone mass,  or have additional risk factors. I have reviewed this report, and agree with the above findings. [facility] Electronically Signed   By: Rolm Baptise M.D.   On: 08/14/2017 12:47   Mm Digital Screening Bilateral  Result Date: 08/14/2017 CLINICAL DATA:  Screening. EXAM: DIGITAL SCREENING BILATERAL MAMMOGRAM WITH CAD COMPARISON:  Previous exam(s). ACR Breast Density Category b: There are scattered areas of fibroglandular density. FINDINGS: There are no findings suspicious for malignancy. Images were processed with CAD. IMPRESSION: No mammographic evidence of malignancy. A result letter of this screening mammogram will be mailed directly to the patient. RECOMMENDATION: Screening mammogram in one year. (Code:SM-B-01Y) BI-RADS CATEGORY  1: Negative. Electronically Signed   By: Lajean Manes M.D.   On: 08/14/2017 15:37     PMFS History: Patient Active Problem List   Diagnosis Date Noted  . Screening for breast cancer 01/08/2017  . Estrogen deficiency 01/08/2017  . PVD (peripheral vascular disease) ([place]) 01/08/2017  . Chronic right shoulder pain 01/08/2017  . Paresthesia of both lower extremities 05/22/2015  . Essential hypertension 05/22/2015  . Mixed dyslipidemia 05/22/2015  . History of back surgery 05/22/2015  . Screening for cancer 05/22/2015  . Vaccine counseling 05/22/2015  . Tobacco use disorder 03/30/2014   Past Medical History:  Diagnosis Date  . Dyslipidemia   . Full dentures   . H/O bone density study 8/14   never  .  H/O mammogram 2010  . Hematuria    microscopic, several prior evaluations, no source or cause found  . Hyperlipidemia   . Hypertension   . Impaired fasting blood sugar 2008  . Influenza vaccine side effect    intolerance, declines  . Tobacco use   . Wears glasses     Family History  Problem Relation Age of Onset  . Alzheimer's disease Mother   . Other Mother        died of UTI, dehydration  . Other Father 44       failure to thrive, old age,  fall and rib fracture  . Diabetes Father   . Diabetes Sister   . Cancer Brother        brain, stomach  . Colon cancer Neg Hx     Past Surgical History:  Procedure Laterality Date  . ABDOMINAL HYSTERECTOMY  1980   total; due to heavy bleeding  . APPENDECTOMY  1980  . KNEE SURGERY     right  . SPINE SURGERY     lumbar  . TONSILLECTOMY    . TUBAL LIGATION     Social History   Occupational History  . Not on file  Tobacco Use  . Smoking status: Current Every Day Smoker    Packs/day: 0.50    Years: 40.00    Pack years: 20.00    Types: Cigarettes  . Smokeless tobacco: Never Used  Substance and Sexual Activity  . Alcohol use: No  . Drug use: No  . Sexual activity: Not on file

## 2017-08-20 ENCOUNTER — Ambulatory Visit: Payer: Self-pay | Admitting: Medical

## 2017-08-27 ENCOUNTER — Ambulatory Visit (INDEPENDENT_AMBULATORY_CARE_PROVIDER_SITE_OTHER): Payer: Medicare Other | Admitting: Medical

## 2017-08-27 VITALS — BP 128/78 | HR 82 | Wt 165.8 lb

## 2017-08-27 DIAGNOSIS — M810 Age-related osteoporosis without current pathological fracture: Secondary | ICD-10-CM | POA: Insufficient documentation

## 2017-08-27 NOTE — Patient Instructions (Signed)
Recommendations:  Avoids falls, avoid clutter in the house   Consider weekly Fosamax or every 6 month injection called Prolia   Exercise regularly including aerobic and weight bearing exercise.   Weight-bearing physical activity and exercises that improve balance and posture can strengthen bones and reduce the chance of a fracture. The more active and fit you are as you age, the less likely you are to fall and break a bone.    Good nutrition. Eat a healthy diet and make certain that you're getting 1233m of calcium daily in the diet from dairy (typically 4 servings) or supplements OTC.   Also they should be getting Vitamin D through eating fish, seafood, getting sun exposure, and using either OTC Vitamin D supplement such as 1000-2000 IU daily unless instructed by [place].   If you smoke you should quit smoking. Smoking cigarettes speeds up bone loss.   Limit alcohol. If you choose to drink alcohol, do so in moderation. For healthy women, that means up to one drink a day.   Osteoporosis Osteoporosis happens when your bones become thinner and weaker. Weak bones can break (fracture) more easily when you slip or fall. Bones most at risk of breaking are in the hip, wrist, and spine. Follow these instructions at home:  Get enough calcium and vitamin D. These nutrients are good for your bones.  Exercise as told by your doctor.  Do not use any tobacco products. This includes cigarettes, chewing tobacco, and electronic cigarettes. If you need help quitting, ask your doctor.  Limit the amount of alcohol you drink.  Take medicines only as told by your doctor.  Keep all follow-up visits as told by your doctor. This is important.  Take care at home to prevent falls. Some ways to do this are: ? Keep rooms well lit and tidy. ? Put safety rails on your stairs. ? Put a rubber mat in the bathroom and other places that are often wet or slippery. Get help right away if:  You fall.  You  hurt yourself. This information is not intended to replace advice given to you by your health care provider. Make sure you discuss any questions you have with your health care provider. Document Released: 11/18/2011 Document Revised: 02/01/2016 Document Reviewed: 02/03/2014 Elsevier Interactive Patient Education  2018 [employer]    Alendronate tablets What is this medicine? ALENDRONATE (a LEN droe nate) slows calcium loss from bones. It helps to make normal healthy bone and to slow bone loss in people with Paget's disease and osteoporosis. It may be used in others at risk for bone loss. This medicine may be used for other purposes; ask your health care provider or pharmacist if you have questions. COMMON BRAND NAME(S): Fosamax What should I tell my health care provider before I take this medicine? They need to know if you have any of these conditions: -dental disease -esophagus, stomach, or intestine problems, like acid reflux or GERD -kidney disease -low blood calcium -low vitamin D -problems sitting or standing 30 minutes -trouble swallowing -an unusual or allergic reaction to alendronate, other medicines, foods, dyes, or preservatives -pregnant or trying to get pregnant -breast-feeding How should I use this medicine? You must take this medicine exactly as directed or you will lower the amount of the medicine you absorb into your body or you may cause yourself harm. Take this medicine by mouth first thing in the morning, after you are up for the day. Do not eat or drink anything before you  take your medicine. Swallow the tablet with a full glass (6 to 8 fluid ounces) of plain water. Do not take this medicine with any other drink. Do not chew or crush the tablet. After taking this medicine, do not eat breakfast, drink, or take any medicines or vitamins for at least 30 minutes. Sit or stand up for at least 30 minutes after you take this medicine; do not lie down. Do not take your  medicine more often than directed. Talk to your pediatrician regarding the use of this medicine in children. Special care may be needed. Overdosage: If you think you have taken too much of this medicine contact a poison control center or emergency room at once. NOTE: This medicine is only for you. Do not share this medicine with others. What if I miss a dose? If you miss a dose, do not take it later in the day. Continue your normal schedule starting the next morning. Do not take double or extra doses. What may interact with this medicine? -aluminum hydroxide -antacids -aspirin -calcium supplements -drugs for inflammation like ibuprofen, naproxen, and others -iron supplements -magnesium supplements -vitamins with minerals This list may not describe all possible interactions. Give your health care provider a list of all the medicines, herbs, non-prescription drugs, or dietary supplements you use. Also tell them if you smoke, drink alcohol, or use illegal drugs. Some items may interact with your medicine. What should I watch for while using this medicine? Visit your doctor or health care professional for regular checks ups. It may be some time before you see benefit from this medicine. Do not stop taking your medicine except on your doctor's advice. Your doctor or health care professional may order blood tests and other tests to see how you are doing. You should make sure you get enough calcium and vitamin D while you are taking this medicine, unless your doctor tells you not to. Discuss the foods you eat and the vitamins you take with your health care professional. Some people who take this medicine have severe bone, joint, and/or muscle pain. This medicine may also increase your risk for a broken thigh bone. Tell your doctor right away if you have pain in your upper leg or groin. Tell your doctor if you have any pain that does not go away or that gets worse. This medicine can make you more  sensitive to the sun. If you get a rash while taking this medicine, sunlight may cause the rash to get worse. Keep out of the sun. If you cannot avoid being in the sun, wear protective clothing and use sunscreen. Do not use sun lamps or tanning beds/booths. What side effects may I notice from receiving this medicine? Side effects that you should report to your doctor or health care professional as soon as possible: -allergic reactions like skin rash, itching or hives, swelling of the face, lips, or tongue -black or tarry stools -bone, muscle or joint pain -changes in vision -chest pain -heartburn or stomach pain -jaw pain, especially after dental work -pain or trouble when swallowing -redness, blistering, peeling or loosening of the skin, including inside the mouth Side effects that usually do not require medical attention (report to your doctor or health care professional if they continue or are bothersome): -changes in taste -diarrhea or constipation -eye pain or itching -headache -nausea or vomiting -stomach gas or fullness This list may not describe all possible side effects. Call your doctor for medical advice about side effects. You may report  side effects to FDA at 1-800-FDA-1088. Where should I keep my medicine? Keep out of the reach of children. Store at room temperature of 15 and 30 degrees C (59 and 86 degrees F). Throw away any unused medicine after the expiration date. NOTE: This sheet is a summary. It may not cover all possible information. If you have questions about this medicine, talk to your doctor, pharmacist, or health care provider.  2018 Elsevier/Gold Standard (2011-02-22 08:56:09)    Denosumab injection/Prolia What is this medicine? DENOSUMAB (den oh sue mab) slows bone breakdown. Prolia is used to treat osteoporosis in women after menopause and in men. Delton See is used to treat a high calcium level due to cancer and to prevent bone fractures and other bone problems  caused by multiple myeloma or cancer bone metastases. Delton See is also used to treat giant cell tumor of the bone. This medicine may be used for other purposes; ask your health care provider or pharmacist if you have questions. COMMON BRAND NAME(S): Prolia, XGEVA What should I tell my health care provider before I take this medicine? They need to know if you have any of these conditions: -dental disease -having surgery or tooth extraction -infection -kidney disease -low levels of calcium or Vitamin D in the blood -malnutrition -on hemodialysis -skin conditions or sensitivity -thyroid or parathyroid disease -an unusual reaction to denosumab, other medicines, foods, dyes, or preservatives -pregnant or trying to get pregnant -breast-feeding How should I use this medicine? This medicine is for injection under the skin. It is given by a health care professional in a hospital or clinic setting. If you are getting Prolia, a special MedGuide will be given to you by the pharmacist with each prescription and refill. Be sure to read this information carefully each time. For Prolia, talk to your pediatrician regarding the use of this medicine in children. Special care may be needed. For Delton See, talk to your pediatrician regarding the use of this medicine in children. While this drug may be prescribed for children as young as 13 years for selected conditions, precautions do apply. Overdosage: If you think you have taken too much of this medicine contact a poison control center or emergency room at once. NOTE: This medicine is only for you. Do not share this medicine with others. What if I miss a dose? It is important not to miss your dose. Call your doctor or health care professional if you are unable to keep an appointment. What may interact with this medicine? Do not take this medicine with any of the following medications: -other medicines containing denosumab This medicine may also interact with the  following medications: -medicines that lower your chance of fighting infection -steroid medicines like prednisone or cortisone This list may not describe all possible interactions. Give your health care provider a list of all the medicines, herbs, non-prescription drugs, or dietary supplements you use. Also tell them if you smoke, drink alcohol, or use illegal drugs. Some items may interact with your medicine. What should I watch for while using this medicine? Visit your doctor or health care professional for regular checks on your progress. Your doctor or health care professional may order blood tests and other tests to see how you are doing. Call your doctor or health care professional for advice if you get a fever, chills or sore throat, or other symptoms of a cold or flu. Do not treat yourself. This drug may decrease your body's ability to fight infection. Try to avoid being around people  who are sick. You should make sure you get enough calcium and vitamin D while you are taking this medicine, unless your doctor tells you not to. Discuss the foods you eat and the vitamins you take with your health care professional. See your dentist regularly. Brush and floss your teeth as directed. Before you have any dental work done, tell your dentist you are receiving this medicine. Do not become pregnant while taking this medicine or for 5 months after stopping it. Talk with your doctor or health care professional about your birth control options while taking this medicine. Women should inform their doctor if they wish to become pregnant or think they might be pregnant. There is a potential for serious side effects to an unborn child. Talk to your health care professional or pharmacist for more information. What side effects may I notice from receiving this medicine? Side effects that you should report to your doctor or health care professional as soon as possible: -allergic reactions like skin rash, itching or  hives, swelling of the face, lips, or tongue -bone pain -breathing problems -dizziness -jaw pain, especially after dental work -redness, blistering, peeling of the skin -signs and symptoms of infection like fever or chills; cough; sore throat; pain or trouble passing urine -signs of low calcium like fast heartbeat, muscle cramps or muscle pain; pain, tingling, numbness in the hands or feet; seizures -unusual bleeding or bruising -unusually weak or tired Side effects that usually do not require medical attention (report to your doctor or health care professional if they continue or are bothersome): -constipation -diarrhea -headache -joint pain -loss of appetite -muscle pain -runny nose -tiredness -upset stomach This list may not describe all possible side effects. Call your doctor for medical advice about side effects. You may report side effects to FDA at 1-800-FDA-1088. Where should I keep my medicine? This medicine is only given in a clinic, doctor's office, or other health care setting and will not be stored at home. NOTE: This sheet is a summary. It may not cover all possible information. If you have questions about this medicine, talk to your doctor, pharmacist, or health care provider.  2018 Elsevier/Gold Standard (2016-09-17 19:17:21)

## 2017-08-27 NOTE — Progress Notes (Signed)
Subjective: Chief Complaint  Patient presents with  . discuss results od dexa    disucuss results of dexa    She is here to discuss her recent bone density scan showing osteoporosis.  This was her first baseline bone density scan.  Her mother has a history of osteoporosis, she is postmenopausal, and she does smoke.  She limits alcohol.  She works out in the garden and does get weightbearing exercise.  She drinks milk regularly but currently not taking over-the-counter vitamin D, does not eat a lot of fish.  She denies any falls although she has thought about fall prevention.  She denies any fracture as an adult.  She has dentures, no history of osteonecrosis of the jaw. No other aggravating or relieving factors. No other complaint.  Past Medical History:  Diagnosis Date  . Dyslipidemia   . Full dentures   . H/O bone density study 8/14   never  . H/O mammogram 2010  . Hematuria    microscopic, several prior evaluations, no source or cause found  . Hyperlipidemia   . Hypertension   . Impaired fasting blood sugar 2008  . Influenza vaccine side effect    intolerance, declines  . Tobacco use   . Wears glasses    Current Outpatient Medications on File Prior to Visit  Medication Sig Dispense Refill  . lisinopril-hydrochlorothiazide (PRINZIDE,ZESTORETIC) 20-25 MG tablet TAKE 1 TABLET BY MOUTH DAILY 90 tablet 0  . Multiple Vitamin (MULTIVITAMIN WITH MINERALS) TABS tablet Take 1 tablet by mouth daily.    Marland Kitchen omega-3 acid ethyl esters (LOVAZA) 1 g capsule Take 2 capsules (2 g total) by mouth 2 (two) times daily. 120 capsule 11  . rosuvastatin (CRESTOR) 40 MG tablet Take 1 tablet (40 mg total) by mouth daily. 90 tablet 3   Current Facility-Administered Medications on File Prior to Visit  Medication Dose Route Frequency Provider Last Rate Last Dose  . 0.9 %  sodium chloride infusion  500 mL Intravenous Continuous Danis, Estill Cotta III, MD       ROS as in subjective   Objective: BP 128/78    Pulse 82   Wt 165 lb 12.8 oz (75.2 kg)   SpO2 98%   BMI 30.33 kg/m   Gen: wd, wn, nad otherwise not examined    Assessment: Encounter Diagnosis  Name Primary?  . Osteoporosis without current pathological fracture, unspecified osteoporosis type Yes     Plan: We discussed her bone density findings of osteoporosis, we discussed lifestyle recommendations, treatment recommendations, medication options to slow the rate of bone resorption, discussed timing of repeat imaging, discussed fall prevention  She wants to consider medications such as Fosamax, Boniva, Prolia, but declines medication today  Other recommendations discussed:   Exercise regularly including aerobic and weight bearing exercise.   Weight-bearing physical activity and exercises that improve balance and posture can strengthen bones and reduce the chance of a fracture. The more active and fit you are as you age, the less likely you are to fall and break a bone.   Good nutrition. Eat a healthy diet and make certain that you're getting 1200mg  of calcium daily in the diet from dairy (typically 4 servings) or supplements OTC.   Also they should be getting Vitamin D through eating fish, seafood, getting sun exposure, and using either OTC Vitamin D supplement such as 1000-2000 IU daily unless instructed by [place] otherwise.   If they smoke they should quit smoking. Smoking cigarettes speeds up bone loss.  Limit  alcohol. If you choose to drink alcohol, do so in moderation. For healthy women, that means up to one drink a day.  Tatijana was seen today for discuss results od dexa.  Diagnoses and all orders for this visit:  Osteoporosis without current pathological fracture, unspecified osteoporosis type

## 2017-09-16 ENCOUNTER — Telehealth: Payer: Self-pay | Admitting: Medical

## 2017-09-16 ENCOUNTER — Other Ambulatory Visit: Payer: Self-pay | Admitting: Medical

## 2017-09-16 MED ORDER — GABAPENTIN 100 MG PO CAPS: 100.0000 mg | ORAL_CAPSULE | Freq: Every day | ORAL | 1 refills | Status: DC

## 2017-09-16 NOTE — Telephone Encounter (Signed)
Rcvd refill request for Gabapentin 100 mg #90

## 2017-10-23 ENCOUNTER — Ambulatory Visit (INDEPENDENT_AMBULATORY_CARE_PROVIDER_SITE_OTHER): Payer: Medicare HMO | Admitting: Orthopaedic Surgery

## 2017-10-23 ENCOUNTER — Encounter (INDEPENDENT_AMBULATORY_CARE_PROVIDER_SITE_OTHER): Payer: Self-pay | Admitting: Orthopaedic Surgery

## 2017-10-23 VITALS — BP 105/53 | HR 86 | Resp 16 | Ht 62.5 in | Wt 165.0 lb

## 2017-10-23 DIAGNOSIS — M25511 Pain in right shoulder: Secondary | ICD-10-CM

## 2017-10-23 DIAGNOSIS — G8929 Other chronic pain: Secondary | ICD-10-CM | POA: Diagnosis not present

## 2017-10-23 NOTE — Progress Notes (Signed)
Office Visit Note   Patient: Alyssa Carter           Date of Birth: 12/18/1945           MRN: [id number] Visit Date: 10/23/2017              Requested by: Carlena Hurl, PA-C [address] [place], [place] [address] PCP: Carlena Hurl, PA-C   Assessment & Plan: Visit Diagnoses:  1. Chronic right shoulder pain     Plan: Alyssa Carter has decided that she would like to proceed with rotator cuff tear repair of her right shoulder. She did have an MRI scan performed in June 2018 demonstrating revealing the long head biceps intact. Very mild fatty atrophy of the supraspinatus. There was a near complete supraspinatus tendon tear. It centered 1.5 cm medial to the greater tuberosity and measures 1.6 cm from front to back. There is a gap between 10 and fragments of between 1 and 1.5 cm there was moderate to moderately severe degenerative change at the acromioclavicular joint with a type II acromion and subacromial and subdeltoid bursitis. Mild degenerative changes at glenohumeral joint were identified with a small osteophyte off the humeral head. Alyssa Carter has been performing exercises. She continues to have pain to the point of compromise and sleep. Will schedule surgery include an outpatient arthroscopic SCD, D CR and mini open rotator cuff tear repair. Discuss surgery, but it of immobilization possible side effects  Follow-Up Instructions: Return will schedule RCT repair.   Orders:  No orders of the defined types were placed in this encounter.  No orders of the defined types were placed in this encounter.     Procedures: No procedures performed   Clinical Data: No additional findings.   Subjective: Chief Complaint  Patient presents with  . Right Shoulder - Pain    Ms. Carter is a 72 y o here today to discuss Right shoulder surgery.  As above. Alyssa Carter is been seen on a number of occasions for evaluation of right shoulder pain culminating in an MRI scan of her right  shoulder in June. This demonstrated a severely degenerative supraspinatus tendon with tearing. She's been trying to treat it with over-the-counter medicines exercises and ice and continues to have pain to the point of compromise and would like to proceed with surgery as we have previously discussed  HPI  Review of Systems  Constitutional: Negative for chills, fatigue and fever.  HENT: Positive for tinnitus. Negative for hearing loss.   Eyes: Negative for itching.  Respiratory: Negative for chest tightness and shortness of breath.   Cardiovascular: Negative for chest pain, palpitations and leg swelling.  Gastrointestinal: Negative for blood in stool, constipation and diarrhea.  Endocrine: Negative for polyuria.  Genitourinary: Negative for dysuria.  Musculoskeletal: Positive for neck pain and neck stiffness. Negative for back pain and joint swelling.  Allergic/Immunologic: Negative for immunocompromised state.  Neurological: Positive for light-headedness. Negative for dizziness, numbness and headaches.  Hematological: Does not bruise/bleed easily.  Psychiatric/Behavioral: Positive for sleep disturbance. The patient is not nervous/anxious.      Objective: Vital Signs: BP (!) 105/53   Pulse 86   Resp 16   Ht 5' 2.5" (1.588 m)   Wt 165 lb (74.8 kg)   BMI 29.70 kg/m   Physical Exam  Ortho Exam right shoulder exam with full overhead motion but a very circuitous motion. Positive impingement. Positive empty can testing. Good strength. Skin intact. Biceps intact. Minimal crepitation with motion. Sensory  exam intact  Specialty Comments:  No specialty comments available.  Imaging: No results found.   PMFS History: Patient Active Problem List   Diagnosis Date Noted  . Osteoporosis without current pathological fracture 08/27/2017  . Screening for breast cancer 01/08/2017  . Estrogen deficiency 01/08/2017  . PVD (peripheral vascular disease) ([place]) 01/08/2017  . Chronic right  shoulder pain 01/08/2017  . Paresthesia of both lower extremities 05/22/2015  . Essential hypertension 05/22/2015  . Mixed dyslipidemia 05/22/2015  . History of back surgery 05/22/2015  . Screening for cancer 05/22/2015  . Vaccine counseling 05/22/2015  . Tobacco use disorder 03/30/2014   Past Medical History:  Diagnosis Date  . Dyslipidemia   . Full dentures   . H/O bone density study 8/14   never  . H/O mammogram 2010  . Hematuria    microscopic, several prior evaluations, no source or cause found  . Hyperlipidemia   . Hypertension   . Impaired fasting blood sugar 2008  . Influenza vaccine side effect    intolerance, declines  . Tobacco use   . Wears glasses     Family History  Problem Relation Age of Onset  . Alzheimer's disease Mother   . Other Mother        died of UTI, dehydration  . Other Father 49       failure to thrive, old age, fall and rib fracture  . Diabetes Father   . Diabetes Sister   . Cancer Brother        brain, stomach  . Colon cancer Neg Hx     Past Surgical History:  Procedure Laterality Date  . ABDOMINAL HYSTERECTOMY  1980   total; due to heavy bleeding  . APPENDECTOMY  1980  . KNEE SURGERY     right  . SPINE SURGERY     lumbar  . TONSILLECTOMY    . TUBAL LIGATION     Social History   Occupational History  . Not on file  Tobacco Use  . Smoking status: Current Every Day Smoker    Packs/day: 0.50    Years: 40.00    Pack years: 20.00    Types: Cigarettes  . Smokeless tobacco: Never Used  Substance and Sexual Activity  . Alcohol use: No  . Drug use: No  . Sexual activity: Not on file

## 2017-11-06 ENCOUNTER — Encounter: Payer: Self-pay | Admitting: Orthopaedic Surgery

## 2017-11-06 DIAGNOSIS — M75121 Complete rotator cuff tear or rupture of right shoulder, not specified as traumatic: Secondary | ICD-10-CM | POA: Diagnosis not present

## 2017-11-06 DIAGNOSIS — M7551 Bursitis of right shoulder: Secondary | ICD-10-CM | POA: Diagnosis not present

## 2017-11-06 DIAGNOSIS — M19011 Primary osteoarthritis, right shoulder: Secondary | ICD-10-CM | POA: Diagnosis not present

## 2017-11-06 DIAGNOSIS — M7541 Impingement syndrome of right shoulder: Secondary | ICD-10-CM | POA: Diagnosis not present

## 2017-11-06 DIAGNOSIS — M7521 Bicipital tendinitis, right shoulder: Secondary | ICD-10-CM | POA: Diagnosis not present

## 2017-11-06 DIAGNOSIS — G8918 Other acute postprocedural pain: Secondary | ICD-10-CM | POA: Diagnosis not present

## 2017-11-06 DIAGNOSIS — M659 Synovitis and tenosynovitis, unspecified: Secondary | ICD-10-CM | POA: Diagnosis not present

## 2017-11-10 ENCOUNTER — Ambulatory Visit (INDEPENDENT_AMBULATORY_CARE_PROVIDER_SITE_OTHER): Payer: Medicare Other | Admitting: Orthopaedic Surgery

## 2017-11-10 ENCOUNTER — Encounter (INDEPENDENT_AMBULATORY_CARE_PROVIDER_SITE_OTHER): Payer: Self-pay | Admitting: Orthopaedic Surgery

## 2017-11-10 DIAGNOSIS — G8929 Other chronic pain: Secondary | ICD-10-CM

## 2017-11-10 DIAGNOSIS — M25511 Pain in right shoulder: Secondary | ICD-10-CM

## 2017-11-10 NOTE — Progress Notes (Signed)
Office Visit Note   Patient: Alyssa Carter           Date of Birth: 12/15/1945           MRN: [id number] Visit Date: 11/10/2017              Requested by: Carlena Hurl, PA-C [address] [place], [place] [address] PCP: Carlena Hurl, PA-C   Assessment & Plan: Visit Diagnoses:  1. Chronic right shoulder pain     Plan: 4 days status post rotator cuff tear repair right shoulder and doing well no shortness of breath or chest pain. Taking very few pain medicines. Wearing the sling. I removed the dressing. Steri-Strips in place. May shower. Begin circumduction exercises. Office 1 week and start therapy  Follow-Up Instructions: Return in about 1 week (around 11/17/2017).   Orders:  No orders of the defined types were placed in this encounter.  No orders of the defined types were placed in this encounter.     Procedures: No procedures performed   Clinical Data: No additional findings.   Subjective: Chief Complaint  Patient presents with  . Right Shoulder - Routine Post Op  4 days status post rotator cuff tear repair with a mini incision right shoulder. Doing well. Not having any appreciable pain just "sore". Wearing the sling. No shortness of breath or chest pain  HPI  Review of Systems   Objective: Vital Signs: There were no vitals taken for this visit.  Physical Exam  Ortho Exam awake alert and oriented 3. Comfortable sitting. Dressing removed from right shoulder. Incisions healing without problem. Steri-Strips in place. Neurovascular exam intact. Mild ecchymosis as expected about the incision and proximal arm.  Specialty Comments:  No specialty comments available.  Imaging: No results found.   PMFS History: Patient Active Problem List   Diagnosis Date Noted  . Osteoporosis without current pathological fracture 08/27/2017  . Screening for breast cancer 01/08/2017  . Estrogen deficiency 01/08/2017  . PVD (peripheral vascular disease) ([place])  01/08/2017  . Chronic right shoulder pain 01/08/2017  . Paresthesia of both lower extremities 05/22/2015  . Essential hypertension 05/22/2015  . Mixed dyslipidemia 05/22/2015  . History of back surgery 05/22/2015  . Screening for cancer 05/22/2015  . Vaccine counseling 05/22/2015  . Tobacco use disorder 03/30/2014   Past Medical History:  Diagnosis Date  . Dyslipidemia   . Full dentures   . H/O bone density study 8/14   never  . H/O mammogram 2010  . Hematuria    microscopic, several prior evaluations, no source or cause found  . Hyperlipidemia   . Hypertension   . Impaired fasting blood sugar 2008  . Influenza vaccine side effect    intolerance, declines  . Tobacco use   . Wears glasses     Family History  Problem Relation Age of Onset  . Alzheimer's disease Mother   . Other Mother        died of UTI, dehydration  . Other Father 51       failure to thrive, old age, fall and rib fracture  . Diabetes Father   . Diabetes Sister   . Cancer Brother        brain, stomach  . Colon cancer Neg Hx     Past Surgical History:  Procedure Laterality Date  . ABDOMINAL HYSTERECTOMY  1980   total; due to heavy bleeding  . APPENDECTOMY  1980  . KNEE SURGERY     right  .  SPINE SURGERY     lumbar  . TONSILLECTOMY    . TUBAL LIGATION     Social History   Occupational History  . Not on file  Tobacco Use  . Smoking status: Current Every Day Smoker    Packs/day: 0.50    Years: 40.00    Pack years: 20.00    Types: Cigarettes  . Smokeless tobacco: Never Used  Substance and Sexual Activity  . Alcohol use: No  . Drug use: No  . Sexual activity: Not on file     Garald Balding, MD   Note - This record has been created using [employer].  Chart creation errors have been sought, but may not always  have been located. Such creation errors do not reflect on  the standard of medical care.

## 2017-11-17 ENCOUNTER — Ambulatory Visit (INDEPENDENT_AMBULATORY_CARE_PROVIDER_SITE_OTHER): Payer: Medicare HMO | Admitting: Orthopaedic Surgery

## 2017-11-17 ENCOUNTER — Encounter (INDEPENDENT_AMBULATORY_CARE_PROVIDER_SITE_OTHER): Payer: Self-pay | Admitting: Orthopaedic Surgery

## 2017-11-17 VITALS — BP 104/55 | HR 82 | Ht 63.0 in | Wt 164.0 lb

## 2017-11-17 DIAGNOSIS — G8929 Other chronic pain: Secondary | ICD-10-CM

## 2017-11-17 DIAGNOSIS — M25511 Pain in right shoulder: Secondary | ICD-10-CM

## 2017-11-17 NOTE — Progress Notes (Signed)
Office Visit Note   Patient: Alyssa Carter           Date of Birth: Sep 26, 1945           MRN: [id number] Visit Date: 11/17/2017              Requested by: Carlena Hurl, PA-C [address] [place], [place] [address] PCP: Carlena Hurl, PA-C   Assessment & Plan: Visit Diagnoses:  1. Chronic right shoulder pain     Plan: 11 days status post rotator cuff tear repair right shoulder.  Doing well in the sling.  No problems.  We will start physical therapy. return in 2 weeks  Follow-Up Instructions: Return in about 2 weeks (around 12/01/2017).   Orders:  No orders of the defined types were placed in this encounter.  No orders of the defined types were placed in this encounter.     Procedures: No procedures performed   Clinical Data: No additional findings.   Subjective: Chief Complaint  Patient presents with  . Right Shoulder - Routine Post Op    Alyssa Carter IS 72 Y O F HERE FOT RIGHT SHOULDER POST OP. NOT HAVING ANY ISSUES  11 days status post rotator cuff tear repair right shoulder with a DCR and NSAID.  Doing well.  No significant pain.  No shortness of breath or chest pain  HPI  Review of Systems  Constitutional: Negative for fatigue and fever.  HENT: Positive for ear pain.   Eyes: Negative for pain.  Respiratory: Negative for cough and shortness of breath.   Cardiovascular: Negative for leg swelling.  Gastrointestinal: Negative for blood in stool, constipation and diarrhea.  Genitourinary: Negative for dysuria.  Musculoskeletal: Negative for back pain and neck pain.  Skin: Negative for rash and wound.  Allergic/Immunologic: Negative for food allergies.  Neurological: Negative for dizziness, weakness, light-headedness, numbness and headaches.  Hematological: Bruises/bleeds easily.  Psychiatric/Behavioral: Positive for sleep disturbance.     Objective: Vital Signs: BP (!) 104/55 (BP Location: Left Arm, Patient Position: Sitting, Cuff Size:  Normal)   Pulse 82   Ht 5\' 3"  (1.6 m)   Wt 164 lb (74.4 kg)   BMI 29.05 kg/m   Physical Exam  Ortho Exam right shoulder incisions healing without problem.  Skin intact.  Neurovascular exam intact.  Could passively abduct and flex about 90 degrees   Specialty Comments:  No specialty comments available.  Imaging: No results found.   PMFS History: Patient Active Problem List   Diagnosis Date Noted  . Osteoporosis without current pathological fracture 08/27/2017  . Screening for breast cancer 01/08/2017  . Estrogen deficiency 01/08/2017  . PVD (peripheral vascular disease) ([place]) 01/08/2017  . Chronic right shoulder pain 01/08/2017  . Paresthesia of both lower extremities 05/22/2015  . Essential hypertension 05/22/2015  . Mixed dyslipidemia 05/22/2015  . History of back surgery 05/22/2015  . Screening for cancer 05/22/2015  . Vaccine counseling 05/22/2015  . Tobacco use disorder 03/30/2014   Past Medical History:  Diagnosis Date  . Dyslipidemia   . Full dentures   . H/O bone density study 8/14   never  . H/O mammogram 2010  . Hematuria    microscopic, several prior evaluations, no source or cause found  . Hyperlipidemia   . Hypertension   . Impaired fasting blood sugar 2008  . Influenza vaccine side effect    intolerance, declines  . Tobacco use   . Wears glasses     Family History  Problem Relation Age of Onset  . Alzheimer's disease Mother   . Other Mother        died of UTI, dehydration  . Other Father 38       failure to thrive, old age, fall and rib fracture  . Diabetes Father   . Diabetes Sister   . Cancer Brother        brain, stomach  . Colon cancer Neg Hx     Past Surgical History:  Procedure Laterality Date  . ABDOMINAL HYSTERECTOMY  1980   total; due to heavy bleeding  . APPENDECTOMY  1980  . KNEE SURGERY     right  . SPINE SURGERY     lumbar  . TONSILLECTOMY    . TUBAL LIGATION     Social History   Occupational History  . Not on  file  Tobacco Use  . Smoking status: Current Every Day Smoker    Packs/day: 0.50    Years: 40.00    Pack years: 20.00    Types: Cigarettes  . Smokeless tobacco: Never Used  Substance and Sexual Activity  . Alcohol use: No  . Drug use: No  . Sexual activity: Not on file

## 2017-11-18 ENCOUNTER — Encounter (INDEPENDENT_AMBULATORY_CARE_PROVIDER_SITE_OTHER): Payer: Self-pay | Admitting: Radiology

## 2017-11-18 ENCOUNTER — Telehealth (INDEPENDENT_AMBULATORY_CARE_PROVIDER_SITE_OTHER): Payer: Self-pay | Admitting: Orthopaedic Surgery

## 2017-11-18 ENCOUNTER — Other Ambulatory Visit (INDEPENDENT_AMBULATORY_CARE_PROVIDER_SITE_OTHER): Payer: Self-pay | Admitting: Radiology

## 2017-11-18 DIAGNOSIS — M25511 Pain in right shoulder: Secondary | ICD-10-CM

## 2017-11-18 NOTE — Telephone Encounter (Signed)
Please advise if I can re write note for pt. Thank you.

## 2017-11-18 NOTE — Telephone Encounter (Signed)
Spoke with patient about letter. She asked me to fax it to her daughter Jenny Reichmann at [phone].

## 2017-11-18 NOTE — Progress Notes (Signed)
mb

## 2017-11-18 NOTE — Telephone Encounter (Signed)
Sent updated letter with return date of 11/24/2017 no work restrictions.

## 2017-11-18 NOTE — Telephone Encounter (Signed)
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 With use of left arm. Please call patient if need more clarification. Also, patient unable to go to [facility]. They do not except insurance. Patient needs another facility for PT.

## 2017-11-18 NOTE — Telephone Encounter (Signed)
[facility] for note and referral

## 2017-12-01 ENCOUNTER — Ambulatory Visit (INDEPENDENT_AMBULATORY_CARE_PROVIDER_SITE_OTHER): Payer: Medicare HMO | Admitting: Orthopaedic Surgery

## 2017-12-04 ENCOUNTER — Other Ambulatory Visit: Payer: Self-pay

## 2017-12-04 ENCOUNTER — Ambulatory Visit: Payer: Medicare HMO | Attending: Orthopaedic Surgery | Admitting: Physical Therapy

## 2017-12-04 DIAGNOSIS — M25611 Stiffness of right shoulder, not elsewhere classified: Secondary | ICD-10-CM | POA: Insufficient documentation

## 2017-12-04 NOTE — Therapy (Addendum)
[place], [place], [address] Phone: [phone]   Fax:  [phone]  Physical Therapy Evaluation  Patient Details  Name: Alyssa Carter MRN: [id number] Date of Birth: [birth year] Referring Provider: Joni Fears, MD   Encounter Date: 12/04/2017  PT End of Session - 12/04/17 1103    Visit Number  1    Number of Visits  24    Date for PT Re-Evaluation  01/29/18    PT Start Time  1103    PT Stop Time  1142    PT Time Calculation (min)  39 min    Activity Tolerance  Patient tolerated treatment well    Behavior During Therapy  [facility] for tasks assessed/performed       Past Medical History:  Diagnosis Date  . Dyslipidemia   . Full dentures   . H/O bone density study 8/14   never  . H/O mammogram 2010  . Hematuria    microscopic, several prior evaluations, no source or cause found  . Hyperlipidemia   . Hypertension   . Impaired fasting blood sugar 2008  . Influenza vaccine side effect    intolerance, declines  . Tobacco use   . Wears glasses     Past Surgical History:  Procedure Laterality Date  . ABDOMINAL HYSTERECTOMY  1980   total; due to heavy bleeding  . APPENDECTOMY  1980  . KNEE SURGERY     right  . SPINE SURGERY     lumbar  . TONSILLECTOMY    . TUBAL LIGATION      There were no vitals filed for this visit.   Subjective Assessment - 12/04/17 1119    Subjective  Patient states she is doing really well. She is a little tender in the morning if she has lain on her shoulder but otherwise no pain.    Patient Stated Goals  to get back to yard work    Currently in Pain?  No/denies         [facility] PT Assessment - 12/04/17 0001      Assessment   Medical Diagnosis  s/p R shoulder SAD/DCE/mini RCR    Referring Provider  Joni Fears, MD    Onset Date/Surgical Date  11/06/17    Next MD Visit  12/08/17      Precautions   Precautions  Shoulder      Balance Screen   Has the  patient fallen in the past 6 months  No    Has the patient had a decrease in activity level because of a fear of falling?   No    Is the patient reluctant to leave their home because of a fear of falling?   No      Home Environment   Living Environment  Private residence    Living Arrangements  Alone    Type of [place] to enter    Entrance Stairs-Number of Steps  5    Entrance Stairs-Rails  Can reach both    [place]  One level      Observation/Other Assessments   Focus on Therapeutic Outcomes (FOTO)   36% limited      ROM / Strength   AROM / PROM / Strength  AROM;PROM;Strength      AROM   AROM Assessment Site  Shoulder    Right/Left Shoulder  Right      PROM   PROM Assessment Site  Shoulder    Right/Left Shoulder  Right    Right Shoulder Flexion  160 Degrees    Right Shoulder ABduction  147 Degrees    Right Shoulder Internal Rotation  80 Degrees    Right Shoulder External Rotation  65 Degrees      Palpation   Palpation comment  tender and tight R subscapularis              No data recorded  Objective measurements completed on examination: See above findings.      [facility] Adult PT Treatment/Exercise - 12/04/17 0001      Manual Therapy   Manual Therapy  Passive ROM;Soft tissue mobilization    Soft tissue mobilization  to R subscapularis in supine    Passive ROM  R shoulder flexion, IR/ER             PT Education - 12/04/17 1203    Education provided  Yes    Education Details  Reviewed shoulder precautions; HEP    Person(s) Educated  Patient    Methods  Explanation;Handout;Verbal cues    Comprehension  Verbalized understanding;Returned demonstration          PT Long Term Goals - 12/04/17 1232      PT LONG TERM GOAL #1   Title  I with HEP    Time  4    Period  Weeks    Status  New      PT LONG TERM GOAL #2   Title  Improved active R shoulder flexion to 165 deg to normalize ADLS    Time  4    Period  Weeks     Status  New      PT LONG TERM GOAL #3   Title  Improved R shoulder ER to WFL to don/doff clothing    Time  4    Period  Weeks    Status  New      PT LONG TERM GOAL #4   Title  Patient to demo 4+/5 or better strength in the R shoulder to allow return to chores.    Time  4    Period  Weeks    Status  New      PT LONG TERM GOAL #5   Title  Patient able to perform ADLS with 1/10 pain or less.    Time  4    Period  Weeks    Status  New             Plan - 12/04/17 1204    Clinical Impression Statement  Patient presents today for low complexity evaluation s/p R shoulder SAD/DCE and mini open RCR. She reports minimal pain and has limited ROM and strength. She will benefit from skilled PT to address these deficits.    History and Personal Factors relevant to plan of care:  unremarkable    Clinical Decision Making  Low    Rehab Potential  Excellent    PT Frequency  2x / week    PT Duration  4 weeks    PT Treatment/Interventions  ADLs/Self Care Home Management;Cryotherapy;Electrical Stimulation;Moist Heat;Therapeutic exercise;Manual techniques;Neuromuscular re-education;Passive range of motion;Vasopneumatic Device    PT Next Visit Plan  PROM until new order received from MD    PT Home Exercise Plan  scapular retraction, pendulums, elbow flex/ext, grip    Consulted and Agree with Plan of Care  Patient       Patient will benefit from skilled therapeutic intervention in order to improve   the following deficits and impairments:  Pain, Decreased strength, Decreased range of motion  Visit Diagnosis: Stiffness of right shoulder, not elsewhere classified - Plan: PT plan of care cert/re-cert     Problem List Patient Active Problem List   Diagnosis Date Noted  . Osteoporosis without current pathological fracture 08/27/2017  . Screening for breast cancer 01/08/2017  . Estrogen deficiency 01/08/2017  . PVD (peripheral vascular disease) ([place]) 01/08/2017  . Chronic right shoulder  pain 01/08/2017  . Paresthesia of both lower extremities 05/22/2015  . Essential hypertension 05/22/2015  . Mixed dyslipidemia 05/22/2015  . History of back surgery 05/22/2015  . Screening for cancer 05/22/2015  . Vaccine counseling 05/22/2015  . Tobacco use disorder 03/30/2014    Madelyn Flavors PT 12/04/2017, 12:44 PM  [facility] [address] [place], [place], [address] Phone: [phone]   Fax:  [phone]  Name: Alyssa Carter MRN: [id number] Date of Birth: 1945-09-17  PHYSICAL THERAPY DISCHARGE SUMMARY  Visits from Start of Care: 1  Current functional level related to goals / functional outcomes: See above   Remaining deficits: See above   Education / Equipment: See above   Plan: Patient agrees to discharge.  Patient goals were not met. Patient is being discharged due to the patient's request.  ?????    Pt called to cancel appts stating MD said she didn't need it anymore.   Romualdo Bolk, PT, DPT 12/12/17 8:40 AM Phone: [phone] Fax: [phone]

## 2017-12-04 NOTE — Patient Instructions (Signed)
ROM: Pendulum (Circular)  Let right arm move in circle clockwise, then counterclockwise, by rocking body weight in circular pattern. Circle _10___ times each direction per set. Do _3___ sessions per day.  Pendulum Side to Side  Bend forward 90 at waist, leaning on table for support. Rock body from side to side and let arm swing freely. Repeat _10___ times. Do __3__ sessions per day.  Finger Flexors  Keeping right fingertips straight, press putty toward base of palm. Repeat __20__ times. Do __3__ sessions per day. Activity: Squeeze flour sifter, plastic squeeze bottles, [place] baster, juice from fruit.*  AROM: Elbow Flexion / Extension  With left hand palm up, gently bend elbow as far as possible. Then straighten arm as far as possible. Repeat __10__ times per set. Do __3__ sessions per day.  Posture - Sitting   Sit upright, head facing forward. Try using a roll to support lower back. Keep shoulders relaxed, and avoid rounded back. Keep hips level with knees. Avoid crossing legs for long periods.  Robber: Scapular Retraction: Elbow Flexion (Standing)   With elbows bent to 90, pinch shoulder blades together and rotate arms out, keeping elbows bent. Repeat _10___ times per set. Do _1-3___ sets per session. Do __1__ sessions per day. May use _____ pound weights.  http://[email or web address].exer.us/[email or web address]    Madelyn Flavors, PT 12/04/17 11:41 AM; [facility] [address] [place], [place], [address] Phone: [phone]   Fax:  [phone]

## 2017-12-08 ENCOUNTER — Encounter (INDEPENDENT_AMBULATORY_CARE_PROVIDER_SITE_OTHER): Payer: Self-pay | Admitting: Orthopaedic Surgery

## 2017-12-08 ENCOUNTER — Ambulatory Visit (INDEPENDENT_AMBULATORY_CARE_PROVIDER_SITE_OTHER): Payer: Medicare HMO | Admitting: Orthopaedic Surgery

## 2017-12-08 ENCOUNTER — Ambulatory Visit: Payer: Medicare HMO | Admitting: Physical Therapy

## 2017-12-08 VITALS — BP 126/67 | HR 86 | Resp 16 | Ht 62.0 in | Wt 165.0 lb

## 2017-12-08 DIAGNOSIS — G8929 Other chronic pain: Secondary | ICD-10-CM

## 2017-12-08 DIAGNOSIS — M25511 Pain in right shoulder: Secondary | ICD-10-CM

## 2017-12-08 NOTE — Progress Notes (Signed)
Office Visit Note   Patient: Alyssa Carter           Date of Birth: 29-Nov-1945           MRN: [id number] Visit Date: 12/08/2017              Requested by: Carlena Hurl, PA-C [address] [place], [place] [address] PCP: Carlena Hurl, PA-C   Assessment & Plan: Visit Diagnoses:  1. Chronic right shoulder pain     Plan: 1 month status post rotator cuff tear repair right shoulder with a DCR and subacromial decompression.  Also had biceps tenodesis.  His home exercise program.  Very happy with her present progress.  Able to sleep without any problems with exercises office 1 month  Follow-Up Instructions: Return in about 1 month (around 01/07/2018).   Orders:  No orders of the defined types were placed in this encounter.  No orders of the defined types were placed in this encounter.     Procedures: No procedures performed   Clinical Data: No additional findings.   Subjective: Chief Complaint  Patient presents with  . Right Shoulder - Routine Post Op  . Follow-up    2 WEEK F/U R SHOULDER SURGRY  1 month status post right shoulder surgery including a DCR, subacromial decompression, rotator cuff tear repair and biceps tenodesis.  Has completed her course of physical therapy.  Continues to wear the sling.  No pain.  Very happy with her present course  HPI  Review of Systems  Constitutional: Negative for fatigue and fever.  HENT: Negative for ear pain.   Eyes: Negative for pain.  Respiratory: Negative for cough and shortness of breath.   Cardiovascular: Negative for leg swelling.  Gastrointestinal: Negative for blood in stool and constipation.  Genitourinary: Negative for difficulty urinating.  Musculoskeletal: Negative for back pain and neck pain.  Skin: Negative for rash and wound.  Allergic/Immunologic: Negative for food allergies.  Neurological: Negative for dizziness, weakness, light-headedness, numbness and headaches.  Hematological: Bruises/bleeds  easily.  Psychiatric/Behavioral: Negative for sleep disturbance.     Objective: Vital Signs: BP 126/67 (BP Location: Left Arm, Patient Position: Sitting, Cuff Size: Normal)   Pulse 86   Resp 16   Ht 5\' 2"  (1.575 m)   Wt 165 lb (74.8 kg)   BMI 30.18 kg/m   Physical Exam  Ortho Exam awake alert and oriented x3.  Comfortable sitting.  Incisions right shoulder is healed without problem.  I am able to passively abduct 90 degrees and place her arm fully over her head.  Good grip and good release.  Has not started active motion yet  Specialty Comments:  No specialty comments available.  Imaging: No results found.   PMFS History: Patient Active Problem List   Diagnosis Date Noted  . Osteoporosis without current pathological fracture 08/27/2017  . Screening for breast cancer 01/08/2017  . Estrogen deficiency 01/08/2017  . PVD (peripheral vascular disease) ([employer]) 01/08/2017  . Chronic right shoulder pain 01/08/2017  . Paresthesia of both lower extremities 05/22/2015  . Essential hypertension 05/22/2015  . Mixed dyslipidemia 05/22/2015  . History of back surgery 05/22/2015  . Screening for cancer 05/22/2015  . Vaccine counseling 05/22/2015  . Tobacco use disorder 03/30/2014   Past Medical History:  Diagnosis Date  . Dyslipidemia   . Full dentures   . H/O bone density study 8/14   never  . H/O mammogram 2010  . Hematuria    microscopic, several prior evaluations,  no source or cause found  . Hyperlipidemia   . Hypertension   . Impaired fasting blood sugar 2008  . Influenza vaccine side effect    intolerance, declines  . Tobacco use   . Wears glasses     Family History  Problem Relation Age of Onset  . Alzheimer's disease Mother   . Other Mother        died of UTI, dehydration  . Other Father 4       failure to thrive, old age, fall and rib fracture  . Diabetes Father   . Diabetes Sister   . Cancer Brother        brain, stomach  . Colon cancer Neg Hx     Past  Surgical History:  Procedure Laterality Date  . ABDOMINAL HYSTERECTOMY  1980   total; due to heavy bleeding  . APPENDECTOMY  1980  . KNEE SURGERY     right  . SHOULDER SURGERY    . SPINE SURGERY     lumbar  . TONSILLECTOMY    . TUBAL LIGATION     Social History   Occupational History  . Not on file  Tobacco Use  . Smoking status: Current Every Day Smoker    Packs/day: 0.50    Years: 40.00    Pack years: 20.00    Types: Cigarettes  . Smokeless tobacco: Never Used  Substance and Sexual Activity  . Alcohol use: No  . Drug use: No  . Sexual activity: Not on file

## 2017-12-25 ENCOUNTER — Telehealth: Payer: Self-pay

## 2017-12-25 NOTE — Telephone Encounter (Signed)
Called pt to schedule her for AWV . No answer lvm. [place]

## 2018-01-09 ENCOUNTER — Ambulatory Visit (INDEPENDENT_AMBULATORY_CARE_PROVIDER_SITE_OTHER): Payer: Medicare HMO | Admitting: Orthopaedic Surgery

## 2018-01-09 ENCOUNTER — Encounter (INDEPENDENT_AMBULATORY_CARE_PROVIDER_SITE_OTHER): Payer: Self-pay | Admitting: Orthopaedic Surgery

## 2018-01-09 VITALS — Ht 62.0 in | Wt 162.0 lb

## 2018-01-09 DIAGNOSIS — M75121 Complete rotator cuff tear or rupture of right shoulder, not specified as traumatic: Secondary | ICD-10-CM

## 2018-01-09 NOTE — Progress Notes (Signed)
Office Visit Note   Patient: Alyssa Carter           Date of Birth: 07/13/1946           MRN: [id number] Visit Date: 01/09/2018              Requested by: Carlena Hurl, PA-C [address] [place], [place] [address] PCP: Carlena Hurl, PA-C   Assessment & Plan: Visit Diagnoses:  1. Nontraumatic complete tear of right rotator cuff     Plan: 2 months status post rotator cuff tear repair, biceps tenodesis, SCD and DCR right shoulder.  Doing very well without any problems.  Has completed a course of physical therapy and performing exercises at home.   will continue with the exercises and will plan to see her back as needed  Follow-Up Instructions: Return if symptoms worsen or fail to improve.   Orders:  No orders of the defined types were placed in this encounter.  No orders of the defined types were placed in this encounter.     Procedures: No procedures performed   Clinical Data: No additional findings.   Subjective: Chief Complaint  Patient presents with  . Right Shoulder - Follow-up, Pain  2 months status post rotator cuff tear repair, biceps tenodesis, SCD and DCR right shoulder.  Has done exceptionally well.  Not having any present issues.  Full overhead motion.  No difficulty with activities of daily living.  No numbness or tingling, no related fever or chills  HPI  Review of Systems   Objective: Vital Signs: Ht 5\' 2"  (1.575 m)   Wt 162 lb (73.5 kg)   BMI 29.63 kg/m   Physical Exam  Ortho Exam awake alert and oriented x3.  Comfortable sitting.  Incisions right shoulder have healed without problem.  Able to raise right arm over her head quickly.  No evidence of adhesive capsulitis.  Negative impingement.  Negative empty can testing.  No biceps pain.  Good grip and good release.  Neurovascular exam intact  Specialty Comments:  No specialty comments available.  Imaging: No results found.   PMFS History: Patient Active Problem List   Diagnosis Date Noted  . Osteoporosis without current pathological fracture 08/27/2017  . Screening for breast cancer 01/08/2017  . Estrogen deficiency 01/08/2017  . PVD (peripheral vascular disease) ([place]) 01/08/2017  . Chronic right shoulder pain 01/08/2017  . Paresthesia of both lower extremities 05/22/2015  . Essential hypertension 05/22/2015  . Mixed dyslipidemia 05/22/2015  . History of back surgery 05/22/2015  . Screening for cancer 05/22/2015  . Vaccine counseling 05/22/2015  . Tobacco use disorder 03/30/2014   Past Medical History:  Diagnosis Date  . Dyslipidemia   . Full dentures   . H/O bone density study 8/14   never  . H/O mammogram 2010  . Hematuria    microscopic, several prior evaluations, no source or cause found  . Hyperlipidemia   . Hypertension   . Impaired fasting blood sugar 2008  . Influenza vaccine side effect    intolerance, declines  . Tobacco use   . Wears glasses     Family History  Problem Relation Age of Onset  . Alzheimer's disease Mother   . Other Mother        died of UTI, dehydration  . Other Father 70       failure to thrive, old age, fall and rib fracture  . Diabetes Father   . Diabetes Sister   . Cancer Brother  brain, stomach  . Colon cancer Neg Hx     Past Surgical History:  Procedure Laterality Date  . ABDOMINAL HYSTERECTOMY  1980   total; due to heavy bleeding  . APPENDECTOMY  1980  . KNEE SURGERY     right  . SHOULDER SURGERY    . SPINE SURGERY     lumbar  . TONSILLECTOMY    . TUBAL LIGATION     Social History   Occupational History  . Not on file  Tobacco Use  . Smoking status: Current Every Day Smoker    Packs/day: 0.50    Years: 40.00    Pack years: 20.00    Types: Cigarettes  . Smokeless tobacco: Never Used  Substance and Sexual Activity  . Alcohol use: No  . Drug use: No  . Sexual activity: Not on file     Garald Balding, MD   Note - This record has been created using [employer].    Chart creation errors have been sought, but may not always  have been located. Such creation errors do not reflect on  the standard of medical care.

## 2018-01-09 NOTE — Progress Notes (Deleted)
Office Visit Note   Patient: Alyssa Carter           Date of Birth: November 19, 1945           MRN: [id number] Visit Date: 01/09/2018              Requested by: Carlena Hurl, PA-C [place], [place] [address] PCP: Carlena Hurl, PA-C   Assessment & Plan: Visit Diagnoses: No diagnosis found.  Plan: ***  Follow-Up Instructions: No follow-ups on file.   Orders:  No orders of the defined types were placed in this encounter.  No orders of the defined types were placed in this encounter.     Procedures: No procedures performed   Clinical Data: No additional findings.   Subjective: Chief Complaint  Patient presents with  . Right Shoulder - Follow-up, Pain    HPI  Review of Systems  Constitutional: Positive for activity change.  HENT: Negative.   Eyes: Negative.   Respiratory: Negative.   Cardiovascular: Negative.   Gastrointestinal: Negative.   Endocrine: Negative.   Genitourinary: Negative.   Musculoskeletal: Negative.   Skin: Negative.   Allergic/Immunologic: Negative.   Neurological: Negative.   Hematological: Bruises/bleeds easily.  Psychiatric/Behavioral: Negative.      Objective: Vital Signs: There were no vitals taken for this visit.  Physical Exam  Ortho Exam  Specialty Comments:  No specialty comments available.  Imaging: No results found.   PMFS History: Patient Active Problem List   Diagnosis Date Noted  . Osteoporosis without current pathological fracture 08/27/2017  . Screening for breast cancer 01/08/2017  . Estrogen deficiency 01/08/2017  . PVD (peripheral vascular disease) ([place]) 01/08/2017  . Chronic right shoulder pain 01/08/2017  . Paresthesia of both lower extremities 05/22/2015  . Essential hypertension 05/22/2015  . Mixed dyslipidemia 05/22/2015  . History of back surgery 05/22/2015  . Screening for cancer 05/22/2015  . Vaccine counseling 05/22/2015  . Tobacco use disorder 03/30/2014   Past  Medical History:  Diagnosis Date  . Dyslipidemia   . Full dentures   . H/O bone density study 8/14   never  . H/O mammogram 2010  . Hematuria    microscopic, several prior evaluations, no source or cause found  . Hyperlipidemia   . Hypertension   . Impaired fasting blood sugar 2008  . Influenza vaccine side effect    intolerance, declines  . Tobacco use   . Wears glasses     Family History  Problem Relation Age of Onset  . Alzheimer's disease Mother   . Other Mother        died of UTI, dehydration  . Other Father 47       failure to thrive, old age, fall and rib fracture  . Diabetes Father   . Diabetes Sister   . Cancer Brother        brain, stomach  . Colon cancer Neg Hx     Past Surgical History:  Procedure Laterality Date  . ABDOMINAL HYSTERECTOMY  1980   total; due to heavy bleeding  . APPENDECTOMY  1980  . KNEE SURGERY     right  . SHOULDER SURGERY    . SPINE SURGERY     lumbar  . TONSILLECTOMY    . TUBAL LIGATION     Social History   Occupational History  . Not on file  Tobacco Use  . Smoking status: Current Every Day Smoker    Packs/day: 0.50    Years: 40.00  Pack years: 20.00    Types: Cigarettes  . Smokeless tobacco: Never Used  Substance and Sexual Activity  . Alcohol use: No  . Drug use: No  . Sexual activity: Not on file

## 2018-01-31 ENCOUNTER — Other Ambulatory Visit: Payer: Self-pay | Admitting: Medical

## 2018-02-13 ENCOUNTER — Encounter: Payer: Self-pay | Admitting: Medical

## 2018-02-13 ENCOUNTER — Ambulatory Visit (INDEPENDENT_AMBULATORY_CARE_PROVIDER_SITE_OTHER): Payer: Medicare HMO | Admitting: Medical

## 2018-02-13 VITALS — BP 120/70 | HR 81 | Ht 62.5 in | Wt 159.6 lb

## 2018-02-13 DIAGNOSIS — I10 Essential (primary) hypertension: Secondary | ICD-10-CM | POA: Diagnosis not present

## 2018-02-13 DIAGNOSIS — Z7189 Other specified counseling: Secondary | ICD-10-CM | POA: Diagnosis not present

## 2018-02-13 DIAGNOSIS — Z9889 Other specified postprocedural states: Secondary | ICD-10-CM

## 2018-02-13 DIAGNOSIS — I739 Peripheral vascular disease, unspecified: Secondary | ICD-10-CM

## 2018-02-13 DIAGNOSIS — E782 Mixed hyperlipidemia: Secondary | ICD-10-CM

## 2018-02-13 DIAGNOSIS — E2839 Other primary ovarian failure: Secondary | ICD-10-CM | POA: Diagnosis not present

## 2018-02-13 DIAGNOSIS — Z131 Encounter for screening for diabetes mellitus: Secondary | ICD-10-CM

## 2018-02-13 DIAGNOSIS — Z2821 Immunization not carried out because of patient refusal: Secondary | ICD-10-CM | POA: Insufficient documentation

## 2018-02-13 DIAGNOSIS — Z0000 Encounter for general adult medical examination without abnormal findings: Secondary | ICD-10-CM | POA: Diagnosis not present

## 2018-02-13 DIAGNOSIS — R202 Paresthesia of skin: Secondary | ICD-10-CM

## 2018-02-13 DIAGNOSIS — R7301 Impaired fasting glucose: Secondary | ICD-10-CM

## 2018-02-13 DIAGNOSIS — F17200 Nicotine dependence, unspecified, uncomplicated: Secondary | ICD-10-CM

## 2018-02-13 DIAGNOSIS — M810 Age-related osteoporosis without current pathological fracture: Secondary | ICD-10-CM

## 2018-02-13 DIAGNOSIS — Z7185 Encounter for immunization safety counseling: Secondary | ICD-10-CM

## 2018-02-13 DIAGNOSIS — Z129 Encounter for screening for malignant neoplasm, site unspecified: Secondary | ICD-10-CM | POA: Diagnosis not present

## 2018-02-13 DIAGNOSIS — R69 Illness, unspecified: Secondary | ICD-10-CM | POA: Diagnosis not present

## 2018-02-13 DIAGNOSIS — Z1159 Encounter for screening for other viral diseases: Secondary | ICD-10-CM | POA: Diagnosis not present

## 2018-02-13 NOTE — Progress Notes (Signed)
Subjective:    Alyssa Carter is a 72 y.o. female who presents for Preventative Services visit and chronic medical problems/med check visit.     Primary Care Provider Tysinger, Camelia Eng, PA-C here for primary care  Current Health Care Team:  Dentist, - has dentures  Eye doctor- [facility]  Ortho- Dr. Durward Fortes   Dr. Wilfrid Lund, GI  Dr. Ruta Hinds, vascular surgery  Medical Services you may have received from other than [facility] providers in the past year (date may be approximate) orthopedics  Exercise Current exercise habits: walk everyday   Nutrition/Diet Current diet: in general, a "healthy" diet    Depression Screen Depression screen [facility] 2/9 02/13/2018  Decreased Interest 0  Down, Depressed, Hopeless 0  PHQ - 2 Score 0    Activities of Daily Living Screen/Functional Status Survey Is the patient deaf or have difficulty hearing?: No Does the patient have difficulty seeing, even when wearing glasses/contacts?: No Does the patient have difficulty concentrating, remembering, or making decisions?: No Does the patient have difficulty walking or climbing stairs?: No Does the patient have difficulty dressing or bathing?: No Does the patient have difficulty doing errands alone such as visiting a doctor's office or shopping?: No  Can patient draw a clock face showing 3:15 o'clock, yes  Fall Risk Screen Fall Risk  02/13/2018 01/08/2017 01/17/2016 11/09/2014  Falls in the past year? No Yes No No  Comment - hit head  - -  Number falls in past yr: - 1 - -  Risk for fall due to : - Impaired balance/gait - -    Gait Assessment: Normal gait observed yes  Advanced directives Does patient have a [place]? Yes Does patient have a Living Will? Yes  Past Medical History:  Diagnosis Date  . Dyslipidemia   . Full dentures   . H/O bone density study 8/14   never  . H/O mammogram 2010  . Hematuria    microscopic, several prior evaluations, no  source or cause found  . Hyperlipidemia   . Hypertension   . Impaired fasting blood sugar 2008  . Influenza vaccine side effect    intolerance, declines  . Tobacco use   . Wears glasses     Past Surgical History:  Procedure Laterality Date  . ABDOMINAL HYSTERECTOMY  1980   total; due to heavy bleeding  . APPENDECTOMY  1980  . KNEE SURGERY     right  . SHOULDER SURGERY    . SPINE SURGERY     lumbar  . TONSILLECTOMY    . TUBAL LIGATION      Social History   Socioeconomic History  . Marital status: Legally Separated    Spouse name: Not on file  . Number of children: Not on file  . Years of education: Not on file  . Highest education level: Not on file  Occupational History  . Not on file  Social Needs  . Financial resource strain: Not on file  . Food insecurity:    Worry: Not on file    Inability: Not on file  . Transportation needs:    Medical: Not on file    Non-medical: Not on file  Tobacco Use  . Smoking status: Current Every Day Smoker    Packs/day: 0.50    Years: 40.00    Pack years: 20.00    Types: Cigarettes  . Smokeless tobacco: Never Used  Substance and Sexual Activity  . Alcohol use: No  .  Drug use: No  . Sexual activity: Not on file  Lifestyle  . Physical activity:    Days per week: Not on file    Minutes per session: Not on file  . Stress: Not on file  Relationships  . Social connections:    Talks on phone: Not on file    Gets together: Not on file    Attends religious service: Not on file    Active member of club or organization: Not on file    Attends meetings of clubs or organizations: Not on file    Relationship status: Not on file  . Intimate partner violence:    Fear of current or ex partner: Not on file    Emotionally abused: Not on file    Physically abused: Not on file    Forced sexual activity: Not on file  Other Topics Concern  . Not on file  Social History Narrative   Lives alone (son moved out).  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 Alzheimer's disease Mother   . Other Mother        died of UTI, dehydration  . Other Father 88       failure to thrive, old age, fall and rib fracture  . Diabetes Father   . Diabetes Sister   . Cancer Brother        brain, stomach  . Colon cancer Neg Hx      Current Outpatient Medications:  .  Cholecalciferol (VITAMIN D3 PO), Take by mouth., Disp: , Rfl:  .  lisinopril-hydrochlorothiazide (PRINZIDE,ZESTORETIC) 20-25 MG tablet, TAKE 1 TABLET BY MOUTH DAILY, Disp: 90 tablet, Rfl: 0 .  Multiple Vitamin (MULTIVITAMIN WITH MINERALS) TABS tablet, Take 1 tablet by mouth daily., Disp: , Rfl:  .  omega-3 acid ethyl esters (LOVAZA) 1 g capsule, Take 2 capsules (2 g total) by mouth 2 (two) times daily. (Patient taking differently: Take 2 g by mouth 2 (two) times daily. OTC), Disp: 120 capsule, Rfl: 11 .  rosuvastatin (CRESTOR) 40 MG tablet, Take 1 tablet (40 mg total) by mouth daily., Disp: 90 tablet, Rfl: 3  Current Facility-Administered Medications:  .  0.9 %  sodium chloride infusion, 500 mL, Intravenous, Continuous, Danis, Kirke Corin, MD  Allergies  Allergen Reactions  . Aspirin     constipation    History reviewed: allergies, current medications, past family history, past medical history, past social history, past surgical history and problem list  Chronic issues discussed: None  Acute issues discussed: She recently had surgery on her right shoulder from Dr. Durward Fortes.  Improving, still trying to get her shoulder back to normal range of motion   she still smokes, would like to quit smoking but is hard  She still works 4 hours/week but is retired  she is compliant with medication for blood pressure and cholesterol  She declined osteoporosis medication such as bisphosphonate last visit  Objective:     Biometrics BP 120/70   Pulse 81   Ht 5' 2.5" (1.588 m)   Wt 159 lb  9.6 oz (72.4 kg)   BMI 28.73 kg/m   Wt Readings from Last 3 Encounters:  02/13/18 159 lb 9.6 oz (72.4 kg)  01/09/18 162 lb (73.5 kg)  12/08/17 165 lb (74.8 kg)     Cognitive Testing  Alert? Yes  Normal Appearance?Yes  Oriented to person? Yes  Place? Yes   Time?  Yes  Recall of three objects?  Yes  Can perform simple calculations? Yes  Displays appropriate judgment?Yes  Can read the correct time from a watch face?Yes  General appearance: alert, no distress, WD/WN, white female  Nutritional Status: Inadequate calore intake? no Loss of muscle mass? no Loss of fat beneath skin? no Localized or general edema? no Diminished functional status? no  Other pertinent exam: Skin: scattered macules HEENT: normocephalic, sclerae anicteric, TMs pearly, nares patent, no discharge or erythema, pharynx normal Oral cavity: MMM, no lesions Neck: supple, no lymphadenopathy, no thyromegaly, no masses, no bruits Heart: RRR, normal S1, S2, no murmurs Lungs: CTA bilaterally, no wheezes, rhonchi, or rales Abdomen: +bs, soft, non tender, non distended, no masses, no hepatomegaly, no splenomegaly Musculoskeletal: lumbar surgical scar, anterior right deltoid surgical scar, nontender, no swelling, no obvious deformity Extremities: no edema, no cyanosis, no clubbing Pulses: 2+ symmetric, upper and 1+ lower extremities, normal cap refill Neurological: alert, oriented x 3, CN2-12 intact, strength normal upper extremities and lower extremities, sensation normal throughout, DTRs 2+ throughout, no cerebellar signs, gait normal Psychiatric: normal affect, behavior normal, pleasant  Spider veins of bilat LE, mild varicose veins bilat LE   Adult ECG Report  Indication: HTN, hyperlipidemia, smoker  Rate: 73 bpm  Rhythm: normal sinus rhythm  QRS Axis: 3 degrees  PR Interval: 148ms  QRS Duration: 132ms   QTc: 464ms  Conduction Disturbances: none  Other Abnormalities: none  Patient's cardiac risk factors  are: dyslipidemia, hypertension and smoking/ tobacco exposure.  EKG comparison: 09/2014  Narrative Interpretation: no acute changes, normal EKG     Assessment:   Encounter Diagnoses  Name Primary?  . Essential hypertension Yes  . Medicare annual wellness visit, subsequent   . PVD (peripheral vascular disease) ([place])   . Osteoporosis without current pathological fracture, unspecified osteoporosis type   . Tobacco use disorder   . Paresthesia of both lower extremities   . Mixed dyslipidemia   . Estrogen deficiency   . Vaccine counseling   . Screening for cancer   . History of back surgery   . Need for hepatitis C screening test   . Refused Streptococcus pneumoniae vaccination      Plan:   A preventative services visit was completed today.  During the course of the visit today, we discussed and counseled about appropriate screening and preventive services.  A health risk assessment was established today that included a review of current medications, allergies, social history, family history, medical and preventative health history, biometrics, and preventative screenings to identify potential safety concerns or impairments.  A personalized plan was printed today for your records and use.   Personalized health advice and education was given today to reduce health risks and promote self management and wellness.  Information regarding end of life planning was discussed today.  Conditions/risks identified: Tobacco use, osteoporosis   Recommendations:  I recommend a yearly ophthalmology/optometry visit for glaucoma screening and eye checkup  I recommended a yearly dental visit for hygiene and checkup  Advanced directives - discussed nature and purpose of Advanced Directives, encouraged them to complete them if they have not done so and/or encouraged them to get [place] a copy if they have done this already.  Cancer screening Colon cancer screening:  You are up to date on colonoscopy  screening.   You should still have stool card screening to check for blood in the stool every 3-5 years.   Breast cancer screening -  Please call to schedule your  mammogram at your convenience in December.  [facility] [address], [place]         [place], [place] [address]           Specific Concerns today:  . Osteoporosis-I recommend you get cardio exercise such as walking as well as weightbearing exercise regularly.  I recommend he continue vitamin D supplement.  I do recommend you begin either Fosamax oral tablet weekly or Prolia injection every 6 months to help slow the process of bone loss.  Please let me know about this . I recommend you call 1 800 quit now to help stop smoking, consider medication to help quit smoking.  We can help you with this. . I recommend you call your insurance company about coverage for tetanus booster as well as the Shingrix shingles vaccine.  I recommend he have both of these . I recommend you have a pneumonia vaccine to prevent pneumonia.  There are 2 available pneumonia vaccines that cover over 36 strains of bacterial pneumonia.  Being over 53 puts you at high risk for catching pneumonia. . We will call with lab results. . Hypertension, hyperlipidemia-continue current medication   Medicare Attestation A preventative services visit was completed today.  During the course of the visit the patient was educated and counseled about appropriate screening and preventive services.  A health risk assessment was established with the patient that included a review of current medications, allergies, social history, family history, medical and preventative health history, biometrics, and preventative screenings to identify potential safety concerns or impairments.  A personalized plan was printed today for the patient's records and use.   Personalized health advice and education was given today to reduce  health risks and promote self management and wellness.  Information regarding end of life planning was discussed today.  Dorothea Ogle, PA-C   02/13/2018

## 2018-02-13 NOTE — Patient Instructions (Addendum)
Thanks for trusting [place] with your health care and for coming in for a physical today.  Below are some general recommendations I have for you:  Yearly screenings See your eye doctor yearly for routine vision care. See your dentist yearly for routine dental care including hygiene visits twice yearly. See me here yearly for a routine physical and preventative care visit   Cancer screening Colon cancer screening:  You are up to date on colonoscopy screening.   You should still have stool card screening to check for blood in the stool every 3-5 years.   Breast cancer screening -  Please call to schedule your mammogram at your convenience in December.  [facility] [address], [place]         [place], [place] [address]           Specific Concerns today:  . Osteoporosis-I recommend you get cardio exercise such as walking as well as weightbearing exercise regularly.  I recommend he continue vitamin D supplement.  I do recommend you begin either Fosamax oral tablet weekly or Prolia injection every 6 months to help slow the process of bone loss.  Please let me know about this . I recommend you call 1 800 quit now to help stop smoking, consider medication to help quit smoking.  We can help you with this. . I recommend you call your insurance company about coverage for tetanus booster as well as the Shingrix shingles vaccine.  I recommend he have both of these . I recommend you have a pneumonia vaccine to prevent pneumonia.  There are 2 available pneumonia vaccines that cover over 36 strains of bacterial pneumonia.  Being over 3 puts you at high risk for catching pneumonia. . We will call with lab results.   Please follow up yearly for a physical.    I have included other useful information below for your review.  Preventative Care for Adults - Female      MAINTAIN REGULAR HEALTH EXAMS:  A routine yearly physical is a good  way to check in with your primary care provider about your health and preventive screening. It is also an opportunity to share updates about your health and any concerns you have, and receive a thorough all-over exam.   Most health insurance companies pay for at least some preventative services.  Check with your health plan for specific coverages.  WHAT PREVENTATIVE SERVICES DO WOMEN NEED?  Adult women should have their weight and blood pressure checked regularly.   Women age 52 and older should have their cholesterol levels checked regularly.  Women should be screened for cervical cancer with a Pap smear and pelvic exam beginning at either age 49, or 3 years after they become sexually activity.    Breast cancer screening generally begins at age 25 with a mammogram and breast exam by your primary care provider.    Beginning at age 75 and continuing to age 28, women should be screened for colorectal cancer.  Certain people may need continued testing until age 84.  Updating vaccinations is part of preventative care.  Vaccinations help protect against diseases such as the flu.  Osteoporosis is a disease in which the bones lose minerals and strength as we age. Women ages 38 and over should discuss this with their caregivers, as should women after menopause who have other risk  factors.  Lab tests are generally done as part of preventative care to screen for anemia and blood disorders, to screen for problems with the kidneys and liver, to screen for bladder problems, to check blood sugar, and to check your cholesterol level.  Preventative services generally include counseling about diet, exercise, avoiding tobacco, drugs, excessive alcohol consumption, and sexually transmitted infections.    GENERAL RECOMMENDATIONS FOR GOOD HEALTH:  Healthy diet:  Eat a variety of foods, including fruit, vegetables, animal or vegetable protein, such as meat, fish, chicken, and eggs, or beans, lentils, tofu, and  grains, such as rice.  Drink plenty of water daily.  Decrease saturated fat in the diet, avoid lots of red meat, processed foods, sweets, fast foods, and fried foods.  Exercise:  Aerobic exercise helps maintain good heart health. At least 30-40 minutes of moderate-intensity exercise is recommended. For example, a brisk walk that increases your heart rate and breathing. This should be done on most days of the week.   Find a type of exercise or a variety of exercises that you enjoy so that it becomes a part of your daily life.  Examples are running, walking, swimming, water aerobics, and biking.  For motivation and support, explore group exercise such as aerobic class, spin class, Zumba, Yoga,or  martial arts, etc.    Set exercise goals for yourself, such as a certain weight goal, walk or run in a race such as a 5k walk/run.  Speak to your primary care provider about exercise goals.  Disease prevention:  If you smoke or chew tobacco, find out from your caregiver how to quit. It can literally save your life, no matter how long you have been a tobacco user. If you do not use tobacco, never begin.   Maintain a healthy diet and normal weight. Increased weight leads to problems with blood pressure and diabetes.   The Body Mass Index or BMI is a way of measuring how much of your body is fat. Having a BMI above 27 increases the risk of heart disease, diabetes, hypertension, stroke and other problems related to obesity. Your caregiver can help determine your BMI and based on it develop an exercise and dietary program to help you achieve or maintain this important measurement at a healthful level.  High blood pressure causes heart and blood vessel problems.  Persistent high blood pressure should be treated with medicine if weight loss and exercise do not work.   Fat and cholesterol leaves deposits in your arteries that can block them. This causes heart disease and vessel disease elsewhere in your body.   If your cholesterol is found to be high, or if you have heart disease or certain other medical conditions, then you may need to have your cholesterol monitored frequently and be treated with medication.   Ask if you should have a cardiac stress test if your history suggests this. A stress test is a test done on a treadmill that looks for heart disease. This test can find disease prior to there being a problem.  Menopause can be associated with physical symptoms and risks. Hormone replacement therapy is available to decrease these. You should talk to your caregiver about whether starting or continuing to take hormones is right for you.   Osteoporosis is a disease in which the bones lose minerals and strength as we age. This can result in serious bone fractures. Risk of osteoporosis can be identified using a bone density scan. Women ages 64 and over should discuss  this with their caregivers, as should women after menopause who have other risk factors. Ask your caregiver whether you should be taking a calcium supplement and Vitamin D, to reduce the rate of osteoporosis.   Avoid drinking alcohol in excess (more than two drinks per day).  Avoid use of street drugs. Do not share needles with anyone. Ask for professional help if you need assistance or instructions on stopping the use of alcohol, cigarettes, and/or drugs.  Brush your teeth twice a day with fluoride toothpaste, and floss once a day. Good oral hygiene prevents tooth decay and gum disease. The problems can be painful, unattractive, and can cause other health problems. Visit your dentist for a routine oral and dental check up and preventive care every 6-12 months.   Look at your skin regularly.  Use a mirror to look at your back. Notify your caregivers of changes in moles, especially if there are changes in shapes, colors, a size larger than a pencil eraser, an irregular border, or development of new moles.  Safety:  Use seatbelts 100% of the  time, whether driving or as a passenger.  Use safety devices such as hearing protection if you work in environments with loud noise or significant background noise.  Use safety glasses when doing any work that could send debris in to the eyes.  Use a helmet if you ride a bike or motorcycle.  Use appropriate safety gear for contact sports.  Talk to your caregiver about gun safety.  Use sunscreen with a SPF (or skin protection factor) of 15 or greater.  Lighter skinned people are at a greater risk of skin cancer. Don't forget to also wear sunglasses in order to protect your eyes from too much damaging sunlight. Damaging sunlight can accelerate cataract formation.   Practice safe sex. Use condoms. Condoms are used for birth control and to help reduce the spread of sexually transmitted infections (or STIs).  Some of the STIs are gonorrhea (the clap), chlamydia, syphilis, trichomonas, herpes, HPV (human papilloma virus) and HIV (human immunodeficiency virus) which causes AIDS. The herpes, HIV and HPV are viral illnesses that have no cure. These can result in disability, cancer and death.   Keep carbon monoxide and smoke detectors in your home functioning at all times. Change the batteries every 6 months or use a model that plugs into the wall.   Vaccinations:  Stay up to date with your tetanus shots and other required immunizations. You should have a booster for tetanus every 10 years. Be sure to get your flu shot every year, since 5%-20% of the [place] population comes down with the flu. The flu vaccine changes each year, so being vaccinated once is not enough. Get your shot in the fall, before the flu season peaks.   Other vaccines to consider:  Human Papilloma Virus or HPV causes cancer of the cervix, and other infections that can be transmitted from person to person. There is a vaccine for HPV, and females should get immunized between the ages of 36 and 51. It requires a series of 3 shots.   Pneumococcal  vaccine to protect against certain types of pneumonia.  This is normally recommended for adults age 2 or older.  However, adults younger than 70 years old with certain underlying conditions such as diabetes, heart or lung disease should also receive the vaccine.  Shingles vaccine to protect against Varicella Zoster if you are older than age 55, or younger than 70 years old with certain underlying illness.  If you have not had the Shingrix vaccine, please call your insurer to inquire about coverage for the Shingrix vaccine given in 2 doses.   Some insurers cover this vaccine after age 69, some cover this after age 57.  If your insurer covers this, then call to schedule appointment to have this vaccine here  Hepatitis A vaccine to protect against a form of infection of the liver by a virus acquired from food.  Hepatitis B vaccine to protect against a form of infection of the liver by a virus acquired from blood or body fluids, particularly if you work in health care.  If you plan to travel internationally, check with your local health department for specific vaccination recommendations.  Cancer Screening:  Breast cancer screening is essential to preventive care for women. All women age 15 and older should perform a breast self-exam every month. At age 103 and older, women should have their caregiver complete a breast exam each year. Women at ages 22 and older should have a mammogram (x-ray film) of the breasts. Your caregiver can discuss how often you need mammograms.    Cervical cancer screening includes taking a Pap smear (sample of cells examined under a microscope) from the cervix (end of the uterus). It also includes testing for HPV (Human Papilloma Virus, which can cause cervical cancer). Screening and a pelvic exam should begin at age 85, or 3 years after a woman becomes sexually active. Screening should occur every year, with a Pap smear but no HPV testing, up to age 61. After age 40, you should  have a Pap smear every 3 years with HPV testing, if no HPV was found previously.   Most routine colon cancer screening begins at the age of 57. On a yearly basis, doctors may provide special easy to use take-home tests to check for hidden blood in the stool. Sigmoidoscopy or colonoscopy can detect the earliest forms of colon cancer and is life saving. These tests use a small camera at the end of a tube to directly examine the colon. Speak to your caregiver about this at age 3, when routine screening begins (and is repeated every 5 years unless early forms of pre-cancerous polyps or small growths are found).

## 2018-02-14 LAB — VITAMIN D 25 HYDROXY (VIT D DEFICIENCY, FRACTURES): Vit D, 25-Hydroxy: 31.2 ng/mL (ref 30.0–100.0)

## 2018-02-14 LAB — COMPREHENSIVE METABOLIC PANEL
ALK PHOS: 62 IU/L (ref 39–117)
ALT: 18 IU/L (ref 0–32)
AST: 17 IU/L (ref 0–40)
Albumin/Globulin Ratio: 2.3 — ABNORMAL HIGH (ref 1.2–2.2)
Albumin: 4.8 g/dL (ref 3.6–4.8)
BUN/Creatinine Ratio: 29 — ABNORMAL HIGH (ref 12–28)
BUN: 28 mg/dL — AB (ref 8–27)
Bilirubin Total: 0.3 mg/dL (ref 0.0–1.2)
CO2: 23 mmol/L (ref 20–29)
CREATININE: 0.98 mg/dL (ref 0.57–1.00)
Calcium: 10.2 mg/dL (ref 8.7–10.3)
Chloride: 103 mmol/L (ref 96–106)
GFR calc Af Amer: 68 mL/min/{1.73_m2} (ref >59)
GFR calc non Af Amer: 59 mL/min/{1.73_m2} — ABNORMAL LOW (ref >59)
Globulin, Total: 2.1 g/dL (ref 1.5–4.5)
Glucose: 104 mg/dL — ABNORMAL HIGH (ref 65–99)
Potassium: 5 mmol/L (ref 3.5–5.2)
SODIUM: 143 mmol/L (ref 134–144)
Total Protein: 6.9 g/dL (ref 6.0–8.5)

## 2018-02-14 LAB — CBC
Hematocrit: 39.9 % (ref 34.0–46.6)
Hemoglobin: 13.4 g/dL (ref 11.1–15.9)
MCH: 31.6 pg (ref 26.6–33.0)
MCHC: 33.6 g/dL (ref 31.5–35.7)
MCV: 94 fL (ref 79–97)
PLATELETS: 285 10*3/uL (ref 150–450)
RBC: 4.24 x10E6/uL (ref 3.77–5.28)
RDW: 13.5 % (ref 12.3–15.4)
WBC: 6.1 10*3/uL (ref 3.4–10.8)

## 2018-02-14 LAB — LIPID PANEL
CHOLESTEROL TOTAL: 120 mg/dL (ref 100–199)
Chol/HDL Ratio: 3.2 ratio (ref 0.0–4.4)
HDL: 38 mg/dL — ABNORMAL LOW (ref >39)
LDL CALC: 45 mg/dL (ref 0–99)
Triglycerides: 183 mg/dL — ABNORMAL HIGH (ref 0–149)
VLDL Cholesterol Cal: 37 mg/dL (ref 5–40)

## 2018-02-14 LAB — HEPATITIS C ANTIBODY

## 2018-02-14 LAB — HEMOGLOBIN A1C
ESTIMATED AVERAGE GLUCOSE: 120 mg/dL
Hgb A1c MFr Bld: 5.8 % — ABNORMAL HIGH (ref 4.8–5.6)

## 2018-02-16 ENCOUNTER — Other Ambulatory Visit: Payer: Self-pay | Admitting: Medical

## 2018-02-16 ENCOUNTER — Encounter: Payer: Self-pay | Admitting: Medical

## 2018-02-16 MED ORDER — ICOSAPENT ETHYL 1 G PO CAPS
2.0000 | ORAL_CAPSULE | Freq: Two times a day (BID) | ORAL | 11 refills | Status: DC
Start: 2018-02-16 — End: 2019-08-23

## 2018-02-16 MED ORDER — VITAMIN D 1000 UNITS PO TABS: 1000.0000 [IU] | ORAL_TABLET | Freq: Every day | ORAL | 3 refills | Status: DC

## 2018-02-16 MED ORDER — ROSUVASTATIN CALCIUM 40 MG PO TABS: 40.0000 mg | ORAL_TABLET | Freq: Every day | ORAL | 3 refills | Status: DC

## 2018-02-16 MED ORDER — LISINOPRIL-HYDROCHLOROTHIAZIDE 20-25 MG PO TABS: 1.0000 | ORAL_TABLET | Freq: Every day | ORAL | 3 refills | Status: DC

## 2018-05-09 DIAGNOSIS — R69 Illness, unspecified: Secondary | ICD-10-CM | POA: Diagnosis not present

## 2019-04-06 ENCOUNTER — Encounter: Payer: Self-pay | Admitting: Gastroenterology

## 2019-04-27 ENCOUNTER — Other Ambulatory Visit: Payer: Self-pay | Admitting: Medical

## 2019-05-24 ENCOUNTER — Encounter: Payer: Self-pay | Admitting: Medical

## 2019-05-24 ENCOUNTER — Ambulatory Visit (INDEPENDENT_AMBULATORY_CARE_PROVIDER_SITE_OTHER): Payer: Medicare HMO | Admitting: Medical

## 2019-05-24 ENCOUNTER — Other Ambulatory Visit: Payer: Self-pay

## 2019-05-24 VITALS — Temp 98.7°F | Ht 63.0 in | Wt 156.0 lb

## 2019-05-24 DIAGNOSIS — U071 COVID-19: Secondary | ICD-10-CM

## 2019-05-24 NOTE — Progress Notes (Signed)
Subjective:     Patient ID: Alyssa Carter, female   DOB: 1946/02/13, 73 y.o.   MRN: [id number]  This visit type was conducted due to national recommendations for restrictions regarding the COVID-19 Pandemic (e.g. social distancing) in an effort to limit this patient's exposure and mitigate transmission in our community.  Due to their co-morbid illnesses, this patient is at least at moderate risk for complications without adequate follow up.  This format is felt to be most appropriate for this patient at this time.    Documentation for virtual audio and video telecommunications through Zoom encounter:  The patient was located at home. The provider was located in the office. The patient did consent to this visit and is aware of possible charges through their insurance for this visit.  The other persons participating in this telemedicine service were none. Time spent on call was 15 minutes and in review of previous records >15 minutes total.  This virtual service is not related to other E/M service within previous 7 days.   HPI Chief Complaint  Patient presents with  . positve COVID    was tested on 9/10 and resulted 9/12-need note to retunr to work on 9/21    Here for +covid test.   She has no symptoms, has not been sick.   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  Her employer advised she quarantine out of work 10 days.    She needs note for work and other advice.   otherwise in normal state of health.   Past Medical History:  Diagnosis Date  . Dyslipidemia   . Full dentures   . H/O bone density study 8/14   never  . H/O mammogram 2010  . Hematuria    microscopic, several prior evaluations, no source or cause found  . Hyperlipidemia   . Hypertension   . Impaired fasting blood sugar 2008  . Influenza vaccine side effect    intolerance, declines  . Tobacco  use   . Wears glasses    Current Outpatient Medications on File Prior to Visit  Medication Sig Dispense Refill  . cholecalciferol (VITAMIN D) 1000 units tablet Take 1 tablet (1,000 Units total) by mouth daily. 90 tablet 3  . Icosapent Ethyl 1 g CAPS Take 2 capsules (2 g total) by mouth 2 (two) times daily. 120 capsule 11  . lisinopril-hydrochlorothiazide (ZESTORETIC) 20-25 MG tablet TAKE 1 TABLET BY MOUTH EVERY DAY 90 tablet 1  . Multiple Vitamin (MULTIVITAMIN WITH MINERALS) TABS tablet Take 1 tablet by mouth daily.    . rosuvastatin (CRESTOR) 40 MG tablet TAKE 1 TABLET BY MOUTH EVERY DAY 90 tablet 1   Current Facility-Administered Medications on File Prior to Visit  Medication Dose Route Frequency Provider Last Rate Last Dose  . 0.9 %  sodium chloride infusion  500 mL Intravenous Continuous Nelida Meuse III, MD        Review of Systems As in subjective    Objective:   Physical Exam Due to coronavirus pandemic stay at home measures, patient visit was virtual and they were not examined in person.        Assessment:     Encounter Diagnosis  Name Primary?  . COVID-19 virus detected Yes       Plan:     Discussed her + covid test but asymptomatic.   Advised if any new symptom  in the next 2 weeks to call back to discuss. In the meantime discussed self quarantine at home.  May return to work 10 days after + test date.   Will write work note.  Answered her questions.    Alyssa Carter was seen today for positve covid.  Diagnoses and all orders for this visit:  COVID-19 virus detected

## 2019-06-30 DIAGNOSIS — R69 Illness, unspecified: Secondary | ICD-10-CM | POA: Diagnosis not present

## 2019-08-20 ENCOUNTER — Other Ambulatory Visit: Payer: Self-pay

## 2019-08-20 ENCOUNTER — Ambulatory Visit (INDEPENDENT_AMBULATORY_CARE_PROVIDER_SITE_OTHER): Payer: Medicare HMO | Admitting: Medical

## 2019-08-20 VITALS — BP 120/70 | HR 80 | Temp 98.4°F | Ht 63.5 in | Wt 166.4 lb

## 2019-08-20 DIAGNOSIS — Z2821 Immunization not carried out because of patient refusal: Secondary | ICD-10-CM | POA: Diagnosis not present

## 2019-08-20 DIAGNOSIS — Z0000 Encounter for general adult medical examination without abnormal findings: Secondary | ICD-10-CM

## 2019-08-20 DIAGNOSIS — F17200 Nicotine dependence, unspecified, uncomplicated: Secondary | ICD-10-CM | POA: Diagnosis not present

## 2019-08-20 DIAGNOSIS — R1032 Left lower quadrant pain: Secondary | ICD-10-CM | POA: Diagnosis not present

## 2019-08-20 DIAGNOSIS — Z1231 Encounter for screening mammogram for malignant neoplasm of breast: Secondary | ICD-10-CM

## 2019-08-20 DIAGNOSIS — I10 Essential (primary) hypertension: Secondary | ICD-10-CM | POA: Diagnosis not present

## 2019-08-20 DIAGNOSIS — I739 Peripheral vascular disease, unspecified: Secondary | ICD-10-CM

## 2019-08-20 DIAGNOSIS — Z7189 Other specified counseling: Secondary | ICD-10-CM | POA: Diagnosis not present

## 2019-08-20 DIAGNOSIS — Z7185 Encounter for immunization safety counseling: Secondary | ICD-10-CM

## 2019-08-20 DIAGNOSIS — M810 Age-related osteoporosis without current pathological fracture: Secondary | ICD-10-CM | POA: Diagnosis not present

## 2019-08-20 DIAGNOSIS — Z9889 Other specified postprocedural states: Secondary | ICD-10-CM

## 2019-08-20 DIAGNOSIS — E559 Vitamin D deficiency, unspecified: Secondary | ICD-10-CM | POA: Insufficient documentation

## 2019-08-20 DIAGNOSIS — E2839 Other primary ovarian failure: Secondary | ICD-10-CM

## 2019-08-20 DIAGNOSIS — L989 Disorder of the skin and subcutaneous tissue, unspecified: Secondary | ICD-10-CM

## 2019-08-20 DIAGNOSIS — E782 Mixed hyperlipidemia: Secondary | ICD-10-CM | POA: Diagnosis not present

## 2019-08-20 DIAGNOSIS — Z90710 Acquired absence of both cervix and uterus: Secondary | ICD-10-CM | POA: Insufficient documentation

## 2019-08-20 DIAGNOSIS — R69 Illness, unspecified: Secondary | ICD-10-CM | POA: Diagnosis not present

## 2019-08-20 DIAGNOSIS — R7301 Impaired fasting glucose: Secondary | ICD-10-CM | POA: Diagnosis not present

## 2019-08-20 NOTE — Progress Notes (Signed)
Subjective:    Alyssa Carter is a 73 y.o. female who presents for Preventative Services visit and chronic medical problems/med check visit.    Primary Care Provider Tysinger, Camelia Eng, PA-C here for primary care  Current Health Care Team:  Dentist, has dentures  Eye doctor, [employer]  Ortho- Dr. Durward Fortes   Dr. Wilfrid Lund, GI  Dr. Ruta Hinds, vascular surgery   Medical Services you may have received from other than [facility] providers in the past year (date may be approximate) none  Exercise Current exercise habits: walking  Nutrition/Diet Current diet: relatively healthy   Depression Screen Depression screen [facility] 2/9 08/20/2019  Decreased Interest 0  Down, Depressed, Hopeless 0  PHQ - 2 Score 0    Activities of Daily Living Screen/Functional Status Survey Is the patient deaf or have difficulty hearing?: No Does the patient have difficulty seeing, even when wearing glasses/contacts?: No Does the patient have difficulty concentrating, remembering, or making decisions?: No Does the patient have difficulty walking or climbing stairs?: No Does the patient have difficulty dressing or bathing?: No Does the patient have difficulty doing errands alone such as visiting a doctor's office or shopping?: No  Can patient draw a clock face showing 3:15 oclock: yes  Fall Risk Screen Fall Risk  08/20/2019 02/13/2018 01/08/2017 01/17/2016 11/09/2014  Falls in the past year? 0 No Yes No No  Comment - - hit head  - -  Number falls in past yr: 0 - 1 - -  Injury with Fall? 0 - - - -  Risk for fall due to : - - Impaired balance/gait - -    Gait Assessment: Normal gait observed yes  Advanced directives Does patient have a [place]? yes Does patient have a Living Will?yes  Past Medical History:  Diagnosis Date  . Dyslipidemia   . Full dentures   . H/O bone density study 8/14   never  . H/O mammogram 2010  . Hematuria    microscopic, several prior  evaluations, no source or cause found  . Hyperlipidemia   . Hypertension   . Impaired fasting blood sugar 2008  . Influenza vaccine side effect    intolerance, declines  . Tobacco use   . Wears glasses     Past Surgical History:  Procedure Laterality Date  . ABDOMINAL HYSTERECTOMY  1980   total; due to heavy bleeding  . APPENDECTOMY  1980  . KNEE SURGERY     right  . SHOULDER SURGERY    . SPINE SURGERY     lumbar  . TONSILLECTOMY    . TUBAL LIGATION      Social History   Socioeconomic History  . Marital status: Legally Separated    Spouse name: Not on file  . Number of children: Not on file  . Years of education: Not on file  . Highest education level: Not on file  Occupational History  . Not on file  Tobacco Use  . Smoking status: Current Every Day Smoker    Packs/day: 0.50    Years: 40.00    Pack years: 20.00    Types: Cigarettes  . Smokeless tobacco: Never Used  Substance and Sexual Activity  . Alcohol use: No  . Drug use: No  . Sexual activity: Not on file  Other Topics Concern  . Not on file  Social History Narrative   Lives alone (son moved out).  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 Difficulty of Paying Living Expenses: Not on file  Food Insecurity:   . Worried About Charity fundraiser in the Last Year: Not on file  . Ran Out of Food in the Last Year: Not on file  Transportation Needs:   . Lack of Transportation (Medical): Not on file  . Lack of Transportation (Non-Medical): Not on file  Physical Activity:   . Days of Exercise per Week: Not on file  . Minutes of Exercise per Session: Not on file  Stress:   . Feeling of Stress : Not on file  Social Connections:   . Frequency of Communication with Friends and Family: Not on file  . Frequency of Social Gatherings with Friends and Family: Not on file  . Attends Religious Services: Not on file  .  Active Member of Clubs or Organizations: Not on file  . Attends Archivist Meetings: Not on file  . Marital Status: Not on file  Intimate Partner Violence:   . Fear of Current or Ex-Partner: Not on file  . Emotionally Abused: Not on file  . Physically Abused: Not on file  . Sexually Abused: Not on file    Family History  Problem Relation Age of Onset  . Alzheimer's disease Mother   . Other Mother        died of UTI, dehydration  . Other Father 61       failure to thrive, old age, fall and rib fracture  . Diabetes Father   . Diabetes Sister   . Cancer Brother        brain, stomach  . Colon cancer Neg Hx      Current Outpatient Medications:  .  cholecalciferol (VITAMIN D) 1000 units tablet, Take 1 tablet (1,000 Units total) by mouth daily., Disp: 90 tablet, Rfl: 3 .  Icosapent Ethyl 1 g CAPS, Take 2 capsules (2 g total) by mouth 2 (two) times daily., Disp: 120 capsule, Rfl: 11 .  lisinopril-hydrochlorothiazide (ZESTORETIC) 20-25 MG tablet, TAKE 1 TABLET BY MOUTH EVERY DAY, Disp: 90 tablet, Rfl: 1 .  Multiple Vitamin (MULTIVITAMIN WITH MINERALS) TABS tablet, Take 1 tablet by mouth daily., Disp: , Rfl:  .  rosuvastatin (CRESTOR) 40 MG tablet, TAKE 1 TABLET BY MOUTH EVERY DAY, Disp: 90 tablet, Rfl: 1  Current Facility-Administered Medications:  .  0.9 %  sodium chloride infusion, 500 mL, Intravenous, Continuous, Danis, Kirke Corin, MD  Allergies  Allergen Reactions  . Aspirin     constipation    History reviewed: allergies, current medications, past family history, past medical history, past social history, past surgical history and problem list  Chronic issues discussed: For few months been having intermittent discomfortr in left lower abdomen, doesn't necessarily relate to bowels/bladder.  No vaginal concern.  No blood in stool or urine, no incontinence.   No fever, no weight loss  Has new skin lesion of right upper chest , lump that developed back in the summer   Osteoporosis - never started fosamax 2018.  Is taking vit D and gets plenty of dietary calcium  Acute issues discussed: none  Objective:      Biometrics BP 120/70   Pulse 80   Temp 98.4 F (36.9 C)   Ht 5' 3.5" (1.613 m)   Wt 166 lb 6.4 oz (75.5 kg)   BMI 29.01 kg/m   Wt Readings from Last 3 Encounters:  08/20/19 166 lb 6.4 oz (  75.5 kg)  05/24/19 156 lb (70.8 kg)  02/13/18 159 lb 9.6 oz (72.4 kg)     Cognitive Testing  Alert? Yes  Normal Appearance?Yes  Oriented to person? Yes  Place? Yes   Time? Yes  Recall of three objects?  Yes  Can perform simple calculations? Yes  Displays appropriate judgment?Yes  Can read the correct time from a watch face?Yes  General appearance: alert, no distress, WD/WN, white female  Nutritional Status: Inadequate calore intake? no Loss of muscle mass? no Loss of fat beneath skin? no Localized or general edema? no Diminished functional status? no  Other pertinent exam: General well-developed well-nourished no acute distress Skin: Right anterior chest with 1.5 cm raised spongy lesion round, scattered macules throughout, right forearm posterior surface with small 3 mm rough lesions suggestive of [place] HEENT: normocephalic, sclerae anicteric, TMs pearly, nares patent, no discharge or erythema, pharynx normal Oral cavity: MMM, no lesions Neck: supple, no lymphadenopathy, no thyromegaly, no masses Heart: RRR, normal S1, S2, no murmurs Lungs: CTA bilaterally, no wheezes, rhonchi, or rales Abdomen: +bs, soft, mild tenderness in left lower quadrant and suprapubic area, non tender, non distended, no masses, no hepatomegaly, no splenomegaly Musculoskeletal: nontender, no swelling, no obvious deformity Extremities: no edema, no cyanosis, no clubbing Pulses: 2+ symmetric, upper and lower extremities, normal cap refill Neurological: alert, oriented x 3, CN2-12 intact, strength normal upper extremities and lower extremities, sensation normal  throughout, DTRs 2+ throughout, no cerebellar signs, gait normal Psychiatric: normal affect, behavior normal, pleasant  Declines breast and pelvic exam   Assessment:   Encounter Diagnoses  Name Primary?  . Encounter for health maintenance examination in adult Yes  . Medicare annual wellness visit, subsequent   . Left lower quadrant abdominal pain   . Smoker   . Essential hypertension   . PVD (peripheral vascular disease) ([place])   . Osteoporosis without current pathological fracture, unspecified osteoporosis type   . Tobacco use disorder   . Mixed dyslipidemia   . History of back surgery   . Vaccine counseling   . Refused Streptococcus pneumoniae vaccination   . Estrogen deficiency   . S/P hysterectomy   . Encounter for screening mammogram for malignant neoplasm of breast   . Vitamin D deficiency   . Impaired fasting blood sugar   . Advance directive discussed with patient   . Skin lesion      Plan:   A preventative services visit was completed today.  During the course of the visit today, we discussed and counseled about appropriate screening and preventive services.  A health risk assessment was established today that included a review of current medications, allergies, social history, family history, medical and preventative health history, biometrics, and preventative screenings to identify potential safety concerns or impairments.  A personalized plan was printed today for your records and use.   Personalized health advice and education was given today to reduce health risks and promote self management and wellness.  Information regarding end of life planning was discussed today.   Vaccines: You are up-to-date on flu shot I recommend you have the following vaccines.  I recommend you call your insurance company and make sure they cover these vaccines.  These vaccines can be done here or at a local pharmacy.  Tdap, tetanus diphtheria and pertussis booster  Prevnar 13  pneumonia vaccine once  Pneumococcal 23 pneumonia vaccine to be done a year after Prevnar 13  Shingles/Shingrix vaccine, 2 shots 2 months apart  Cancer screens: Call  and schedule your mammogram at the breast center in [place]. [address], [place], [place] [address]  Your last colonoscopy was 2017  You have had a hysterectomy in the past, so you no longer need Pap smear screening  I do recommend an updated breast and pelvic exam.  You declined this today.  We will refer you to dermatology for the new skin and lesion for general skin surveillance  I recommend we do a CT scan of your chest abdomen and pelvis for both lung cancer screen as well as the chronic left lower belly pain   Lab screening: Routine labs today   Other issues: Osteoporosis  Get weightbearing and aerobic exercise regularly such as 150 minutes of aerobic activity per week and weightbearing exercise at least 1-2 times per week  Get 1200 to 1500 mg of calcium daily either through diet or supplements  Continue vitamin D supplement daily  We will look into Prolia injection for treatment of osteoporosis.  Juliann Pulse in our front office generally works on these we referrals.  Check back with [place] if you have not heard back within 10 days  Peripheral arterial disease, vascular disease related to cholesterol and tobacco use  I recommend a follow-up with vascular surgery office continue updated leg screening  Please call Vascular surgery to follow up  Advanced directives Please get [place] a copy of your living will and healthcare power of attorney   Recent several months of abdominal pain We will work on setting up a CT scan of your abdomen and pelvis   High blood pressure, high cholesterol, smoking history and history of peripheral arterial disease  We will make a referral to cardiology for baseline heart evaluation   Shuree was seen today  for awv..  Diagnoses and all orders for this visit:  Encounter for health maintenance examination in adult -     Comprehensive metabolic panel -     CBC with Differential -     Hemoglobin A1c -     Lipid Panel  Medicare annual wellness visit, subsequent  Left lower quadrant abdominal pain -     Comprehensive metabolic panel -     CBC with Differential  Smoker -     Ambulatory referral to Cardiology  Essential hypertension -     Comprehensive metabolic panel -     Lipid Panel -     Ambulatory referral to Cardiology  PVD (peripheral vascular disease) ([place]) -     Ambulatory referral to Cardiology  Osteoporosis without current pathological fracture, unspecified osteoporosis type  Tobacco use disorder -     Ambulatory referral to Cardiology  Mixed dyslipidemia -     Comprehensive metabolic panel -     Lipid Panel -     Ambulatory referral to Cardiology  History of back surgery  Vaccine counseling  Refused Streptococcus pneumoniae vaccination  Estrogen deficiency  S/P hysterectomy  Encounter for screening mammogram for malignant neoplasm of breast  Vitamin D deficiency  Impaired fasting blood sugar -     Hemoglobin A1c  Advance directive discussed with patient  Skin lesion -     Ambulatory referral to Dermatology     Medicare Attestation A preventative services visit was completed today.  During the course of the visit the patient was educated and counseled about appropriate screening and preventive services.  A health risk assessment was established with the patient that included a review of current  medications, allergies, social history, family history, medical and preventative health history, biometrics, and preventative screenings to identify potential safety concerns or impairments.  A personalized plan was printed today for the patient's records and use.   Personalized health advice and education was given today to reduce health risks and promote self  management and wellness.  Information regarding end of life planning was discussed today.  Dorothea Ogle, PA-C   08/20/2019

## 2019-08-20 NOTE — Patient Instructions (Signed)
Thanks for trusting [place] with your health care and for coming in for a physical today.  Below are some general recommendations I have for you:  Yearly screenings See your eye doctor yearly for routine vision care. See your dentist yearly for routine dental care including hygiene visits twice yearly. See me here yearly for a routine physical and preventative care visit   Cancer screening Colon cancer screening:   You are up to date on colonoscopy screening.   You should still have stool card screening to check for blood in the stool every 3-5 years.  Please use the 3 cards given today to smear some poop/fecal matter on each card separately from 3 separate days.  Please mail them back so we can check them for blood  We will refer you for screening colonoscopy  We will refer you for diagnostic colonoscopy.  Breast cancer screening -  Please call to schedule your mammogram at your convenience.  [facility]   [phone]          [phone], [place], [address] [place], [place] [address]        [place], [place] [address]

## 2019-08-21 LAB — COMPREHENSIVE METABOLIC PANEL
ALT: 32 IU/L (ref 0–32)
AST: 27 IU/L (ref 0–40)
Albumin/Globulin Ratio: 2.1 (ref 1.2–2.2)
Albumin: 4.6 g/dL (ref 3.7–4.7)
Alkaline Phosphatase: 67 IU/L (ref 39–117)
BUN/Creatinine Ratio: 18 (ref 12–28)
BUN: 19 mg/dL (ref 8–27)
Bilirubin Total: 0.3 mg/dL (ref 0.0–1.2)
CO2: 24 mmol/L (ref 20–29)
Calcium: 9.9 mg/dL (ref 8.7–10.3)
Chloride: 103 mmol/L (ref 96–106)
Creatinine, Ser: 1.05 mg/dL — ABNORMAL HIGH (ref 0.57–1.00)
GFR calc Af Amer: 62 mL/min/{1.73_m2} (ref >59)
GFR calc non Af Amer: 54 mL/min/{1.73_m2} — ABNORMAL LOW (ref >59)
Globulin, Total: 2.2 g/dL (ref 1.5–4.5)
Glucose: 95 mg/dL (ref 65–99)
Potassium: 4.9 mmol/L (ref 3.5–5.2)
Sodium: 141 mmol/L (ref 134–144)
Total Protein: 6.8 g/dL (ref 6.0–8.5)

## 2019-08-21 LAB — LIPID PANEL
Chol/HDL Ratio: 3.7 ratio (ref 0.0–4.4)
Cholesterol, Total: 131 mg/dL (ref 100–199)
HDL: 35 mg/dL — ABNORMAL LOW (ref >39)
LDL Chol Calc (NIH): 46 mg/dL (ref 0–99)
Triglycerides: 334 mg/dL — ABNORMAL HIGH (ref 0–149)
VLDL Cholesterol Cal: 50 mg/dL — ABNORMAL HIGH (ref 5–40)

## 2019-08-21 LAB — CBC WITH DIFFERENTIAL/PLATELET
Basophils Absolute: 0 10*3/uL (ref 0.0–0.2)
Basos: 0 %
EOS (ABSOLUTE): 0.1 10*3/uL (ref 0.0–0.4)
Eos: 1 %
Hematocrit: 39.4 % (ref 34.0–46.6)
Hemoglobin: 13.7 g/dL (ref 11.1–15.9)
Immature Grans (Abs): 0 10*3/uL (ref 0.0–0.1)
Immature Granulocytes: 0 %
Lymphocytes Absolute: 2.5 10*3/uL (ref 0.7–3.1)
Lymphs: 39 %
MCH: 32.1 pg (ref 26.6–33.0)
MCHC: 34.8 g/dL (ref 31.5–35.7)
MCV: 92 fL (ref 79–97)
Monocytes Absolute: 0.5 10*3/uL (ref 0.1–0.9)
Monocytes: 8 %
Neutrophils Absolute: 3.3 10*3/uL (ref 1.4–7.0)
Neutrophils: 52 %
Platelets: 278 10*3/uL (ref 150–450)
RBC: 4.27 x10E6/uL (ref 3.77–5.28)
RDW: 12.4 % (ref 11.7–15.4)
WBC: 6.3 10*3/uL (ref 3.4–10.8)

## 2019-08-21 LAB — HEMOGLOBIN A1C
Est. average glucose Bld gHb Est-mCnc: 120 mg/dL
Hgb A1c MFr Bld: 5.8 % — ABNORMAL HIGH (ref 4.8–5.6)

## 2019-08-23 ENCOUNTER — Other Ambulatory Visit: Payer: Self-pay | Admitting: Medical

## 2019-08-23 ENCOUNTER — Telehealth: Payer: Self-pay | Admitting: Medical

## 2019-08-23 MED ORDER — VITAMIN D 25 MCG (1000 UNIT) PO TABS: 1000.0000 [IU] | ORAL_TABLET | Freq: Every day | ORAL | 3 refills | Status: DC

## 2019-08-23 MED ORDER — ROSUVASTATIN CALCIUM 40 MG PO TABS: 40.0000 mg | ORAL_TABLET | Freq: Every day | ORAL | 3 refills | Status: DC

## 2019-08-23 MED ORDER — ICOSAPENT ETHYL 1 G PO CAPS: 2.0000 g | ORAL_CAPSULE | Freq: Two times a day (BID) | ORAL | 11 refills | Status: DC

## 2019-08-23 MED ORDER — LISINOPRIL-HYDROCHLOROTHIAZIDE 20-25 MG PO TABS: 1.0000 | ORAL_TABLET | Freq: Every day | ORAL | 3 refills | Status: DC

## 2019-08-23 NOTE — Telephone Encounter (Signed)
I would like to try and get Prolia approved for this patient.  She has osteoporosis based on 2018 bone density scan.  She did not want to take Fosamax or bisphosphonates due to bone destruction risk, gastric side effects.  We discussed medication options, risk and benefits and she agrees to [employer].   What else do been I you need from me?

## 2019-08-24 NOTE — Telephone Encounter (Signed)
Application given to [facility] for completion. Please return to me and I will process.  Thanks,  Juliann Pulse

## 2019-09-14 DIAGNOSIS — D485 Neoplasm of uncertain behavior of skin: Secondary | ICD-10-CM | POA: Diagnosis not present

## 2019-09-22 ENCOUNTER — Ambulatory Visit: Payer: Self-pay | Admitting: Cardiology

## 2019-10-25 ENCOUNTER — Telehealth: Payer: Self-pay | Admitting: Medical

## 2019-10-25 NOTE — Telephone Encounter (Signed)
Left message for pt to call concerning Prolia .  

## 2019-11-04 ENCOUNTER — Telehealth: Payer: Self-pay | Admitting: Medical

## 2019-11-04 NOTE — Telephone Encounter (Signed)
Pt was called again concerning Prolia. I have spoken to her a couple of times concerning starting this medication. Pt was advised that her estimated out of pocket cost per injection would be $248.00. Pt stated that would not be affordable. Pt was advised that I could get monies for her thru the [employer]. That would lower her cost to around $23. Pt stated she would like to think about it. I called her back today in inquire and she states she is not interested in taking this medication at all. I told pt I would let [name] know so he could advise of other treatment options. Pt can be reached at [phone].

## 2019-11-05 ENCOUNTER — Encounter: Payer: Self-pay | Admitting: Medical

## 2019-11-05 NOTE — Telephone Encounter (Signed)
Please mail letter regarding osteoporosis

## 2019-11-05 NOTE — Telephone Encounter (Signed)
done

## 2019-12-15 ENCOUNTER — Other Ambulatory Visit: Payer: Self-pay | Admitting: Medical

## 2019-12-15 DIAGNOSIS — Z1231 Encounter for screening mammogram for malignant neoplasm of breast: Secondary | ICD-10-CM

## 2019-12-28 ENCOUNTER — Ambulatory Visit (INDEPENDENT_AMBULATORY_CARE_PROVIDER_SITE_OTHER): Payer: Medicare HMO | Admitting: Family Medicine

## 2019-12-28 ENCOUNTER — Encounter: Payer: Self-pay | Admitting: Family Medicine

## 2019-12-28 ENCOUNTER — Other Ambulatory Visit: Payer: Self-pay

## 2019-12-28 VITALS — BP 124/72 | HR 79 | Temp 97.1°F | Wt 168.8 lb

## 2019-12-28 DIAGNOSIS — M461 Sacroiliitis, not elsewhere classified: Secondary | ICD-10-CM

## 2019-12-28 NOTE — Patient Instructions (Signed)
Heat for 20 minutes three times a day.  Gentle stretching after the heat 4 ibuprofen three times a day

## 2019-12-28 NOTE — Progress Notes (Signed)
   Subjective:    Patient ID: LAKHIA BREDEMEIER, female    DOB: 02-26-1946, 74 y.o.   MRN: [id number]  HPI She states that on Sunday she was doing yard work pulling weeds.  She is right-handed.  She does not remember any particular injury but noted the onset of right lower back pain.  She does occasionally use ibuprofen for this.  No numbness, tingling or weakness.   Review of Systems     Objective:   Physical Exam She does complain of pain of motion of her back.  Points to the right SI joint area.  Tender to palpation over that area.  Hip motion is normal.  Negative straight leg raising.  FABER test positive.       Assessment & Plan:  Sacroiliitis ([place]) Heat for 20 minutes three times a day.  Gentle stretching after the heat 4 ibuprofen three times a day Stretching exercises were demonstrated to her.  If she has difficulty, she is to call for further evaluation and possible referral to PT or orthopedics

## 2020-02-17 DIAGNOSIS — U071 COVID-19: Secondary | ICD-10-CM | POA: Diagnosis not present

## 2020-04-10 ENCOUNTER — Encounter: Payer: Self-pay | Admitting: Medical

## 2020-04-21 DIAGNOSIS — Z20828 Contact with and (suspected) exposure to other viral communicable diseases: Secondary | ICD-10-CM | POA: Diagnosis not present

## 2020-05-25 ENCOUNTER — Other Ambulatory Visit: Payer: Self-pay

## 2020-05-25 ENCOUNTER — Ambulatory Visit (INDEPENDENT_AMBULATORY_CARE_PROVIDER_SITE_OTHER): Payer: Medicare HMO | Admitting: Medical

## 2020-05-25 ENCOUNTER — Encounter: Payer: Self-pay | Admitting: Medical

## 2020-05-25 VITALS — BP 130/76 | HR 83 | Ht 64.0 in | Wt 168.0 lb

## 2020-05-25 DIAGNOSIS — I739 Peripheral vascular disease, unspecified: Secondary | ICD-10-CM

## 2020-05-25 DIAGNOSIS — F17200 Nicotine dependence, unspecified, uncomplicated: Secondary | ICD-10-CM

## 2020-05-25 DIAGNOSIS — I781 Nevus, non-neoplastic: Secondary | ICD-10-CM

## 2020-05-25 DIAGNOSIS — R7301 Impaired fasting glucose: Secondary | ICD-10-CM

## 2020-05-25 DIAGNOSIS — Z9889 Other specified postprocedural states: Secondary | ICD-10-CM | POA: Diagnosis not present

## 2020-05-25 DIAGNOSIS — E2839 Other primary ovarian failure: Secondary | ICD-10-CM

## 2020-05-25 DIAGNOSIS — E559 Vitamin D deficiency, unspecified: Secondary | ICD-10-CM

## 2020-05-25 DIAGNOSIS — Z1231 Encounter for screening mammogram for malignant neoplasm of breast: Secondary | ICD-10-CM

## 2020-05-25 DIAGNOSIS — Z7189 Other specified counseling: Secondary | ICD-10-CM

## 2020-05-25 DIAGNOSIS — R202 Paresthesia of skin: Secondary | ICD-10-CM | POA: Diagnosis not present

## 2020-05-25 DIAGNOSIS — E782 Mixed hyperlipidemia: Secondary | ICD-10-CM | POA: Diagnosis not present

## 2020-05-25 DIAGNOSIS — G629 Polyneuropathy, unspecified: Secondary | ICD-10-CM

## 2020-05-25 DIAGNOSIS — M810 Age-related osteoporosis without current pathological fracture: Secondary | ICD-10-CM | POA: Diagnosis not present

## 2020-05-25 DIAGNOSIS — Z0000 Encounter for general adult medical examination without abnormal findings: Secondary | ICD-10-CM | POA: Diagnosis not present

## 2020-05-25 DIAGNOSIS — I10 Essential (primary) hypertension: Secondary | ICD-10-CM

## 2020-05-25 DIAGNOSIS — I8393 Asymptomatic varicose veins of bilateral lower extremities: Secondary | ICD-10-CM | POA: Insufficient documentation

## 2020-05-25 DIAGNOSIS — R69 Illness, unspecified: Secondary | ICD-10-CM | POA: Diagnosis not present

## 2020-05-25 DIAGNOSIS — R252 Cramp and spasm: Secondary | ICD-10-CM

## 2020-05-25 DIAGNOSIS — Z122 Encounter for screening for malignant neoplasm of respiratory organs: Secondary | ICD-10-CM

## 2020-05-25 DIAGNOSIS — Z7185 Encounter for immunization safety counseling: Secondary | ICD-10-CM | POA: Insufficient documentation

## 2020-05-25 DIAGNOSIS — Z90710 Acquired absence of both cervix and uterus: Secondary | ICD-10-CM

## 2020-05-25 DIAGNOSIS — H9313 Tinnitus, bilateral: Secondary | ICD-10-CM

## 2020-05-25 MED ORDER — GABAPENTIN 100 MG PO CAPS: 200.0000 mg | ORAL_CAPSULE | Freq: Every day | ORAL | 1 refills | Status: DC

## 2020-05-25 MED ORDER — ROSUVASTATIN CALCIUM 40 MG PO TABS: 40.0000 mg | ORAL_TABLET | Freq: Every day | ORAL | 3 refills | Status: DC

## 2020-05-25 MED ORDER — VITAMIN D 25 MCG (1000 UNIT) PO TABS: 1000.0000 [IU] | ORAL_TABLET | Freq: Every day | ORAL | 3 refills | Status: DC

## 2020-05-25 NOTE — Progress Notes (Signed)
Subjective:    Alyssa Carter is a 74 y.o. female who presents for Preventative Services visit and chronic medical problems/med check visit.    Primary Care Provider Jamarr Treinen, Camelia Eng, PA-C here for primary care  Current Health Care Team:  Dentist, dentures  Eye doctor, [facility]  Dr. Wilfrid Lund, GI  Dr. Joni Fears, ortho  Dr. Ruta Hinds, vascular surgery  Medical Services you may have received from other than [facility] providers in the past year (date may be approximate) none  Exercise Current exercise habits: The patient does not participate in regular exercise at present.   Nutrition/Diet Current diet: in general, an "unhealthy" diet  Depression Screen Depression screen PHQ 2/9 05/25/2020  Decreased Interest 0  Down, Depressed, Hopeless 0  PHQ - 2 Score 0    Activities of Daily Living Screen/Functional Status Survey Is the patient deaf or have difficulty hearing?: No Does the patient have difficulty seeing, even when wearing glasses/contacts?: No Does the patient have difficulty concentrating, remembering, or making decisions?: No Does the patient have difficulty walking or climbing stairs?: No Does the patient have difficulty dressing or bathing?: No Does the patient have difficulty doing errands alone such as visiting a doctor's office or shopping?: No  Can patient draw a clock face showing 11 o'clock: yes   Fall Risk Screen Fall Risk  05/25/2020 08/20/2019 02/13/2018 01/08/2017 01/17/2016  Falls in the past year? 0 0 No Yes No  Comment - - - hit head  -  Number falls in past yr: - 0 - 1 -  Injury with Fall? - 0 - - -  Risk for fall due to : - - - Impaired balance/gait -    Gait Assessment: Normal gait observed  - yes  Advanced directives Does patient have a [place]? Yes Does patient have a Living Will? Yes  Past Medical History:  Diagnosis Date  . Dyslipidemia   . Full dentures   . H/O bone density study 8/14   never    . H/O mammogram 2010  . Hematuria    microscopic, several prior evaluations, no source or cause found  . Hyperlipidemia   . Hypertension   . Impaired fasting blood sugar 2008  . Influenza vaccine side effect    intolerance, declines  . Tobacco use   . Wears glasses     Past Surgical History:  Procedure Laterality Date  . ABDOMINAL HYSTERECTOMY  1980   total; due to heavy bleeding  . APPENDECTOMY  1980  . KNEE SURGERY     right  . SHOULDER SURGERY    . SPINE SURGERY     lumbar  . TONSILLECTOMY    . TUBAL LIGATION      Social History   Socioeconomic History  . Marital status: Legally Separated    Spouse name: Not on file  . Number of children: Not on file  . Years of education: Not on file  . Highest education level: Not on file  Occupational History  . Not on file  Tobacco Use  . Smoking status: Current Every Day Smoker    Packs/day: 0.50    Years: 40.00    Pack years: 20.00    Types: Cigarettes  . Smokeless tobacco: Never Used  Vaping Use  . Vaping Use: Never used  Substance and Sexual Activity  . Alcohol use: No  . Drug use: No  . Sexual activity: Not on file  Other Topics Concern  . Not  on file  Social History Narrative   Lives alone (son moved out).  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 Difficulty of Paying Living Expenses: Not on file  Food Insecurity:   . Worried About Charity fundraiser in the Last Year: Not on file  . Ran Out of Food in the Last Year: Not on file  Transportation Needs:   . Lack of Transportation (Medical): Not on file  . Lack of Transportation (Non-Medical): Not on file  Physical Activity:   . Days of Exercise per Week: Not on file  . Minutes of Exercise per Session: Not on file  Stress:   . Feeling of Stress : Not on file  Social Connections:   . Frequency of Communication with Friends and Family: Not on file  .  Frequency of Social Gatherings with Friends and Family: Not on file  . Attends Religious Services: Not on file  . Active Member of Clubs or Organizations: Not on file  . Attends Archivist Meetings: Not on file  . Marital Status: Not on file  Intimate Partner Violence:   . Fear of Current or Ex-Partner: Not on file  . Emotionally Abused: Not on file  . Physically Abused: Not on file  . Sexually Abused: Not on file    Family History  Problem Relation Age of Onset  . Alzheimer's disease Mother   . Other Mother        died of UTI, dehydration  . Other Father 3       failure to thrive, old age, fall and rib fracture  . Diabetes Father   . Diabetes Sister   . Cancer Brother        brain, stomach  . Colon cancer Neg Hx      Current Outpatient Medications:  .  lisinopril-hydrochlorothiazide (ZESTORETIC) 20-25 MG tablet, Take 1 tablet by mouth daily., Disp: 90 tablet, Rfl: 3 .  Multiple Vitamin (MULTIVITAMIN WITH MINERALS) TABS tablet, Take 1 tablet by mouth daily., Disp: , Rfl:  .  Omega-3 Fatty Acids (FISH OIL) 600 MG CAPS, Take by mouth., Disp: , Rfl:  .  rosuvastatin (CRESTOR) 40 MG tablet, Take 1 tablet (40 mg total) by mouth daily., Disp: 90 tablet, Rfl: 3 .  cholecalciferol (VITAMIN D3) 25 MCG (1000 UNIT) tablet, Take 1 tablet (1,000 Units total) by mouth daily., Disp: 90 tablet, Rfl: 3 .  gabapentin (NEURONTIN) 100 MG capsule, Take 2 capsules (200 mg total) by mouth at bedtime., Disp: 60 capsule, Rfl: 1  Current Facility-Administered Medications:  .  0.9 %  sodium chloride infusion, 500 mL, Intravenous, Continuous, Danis, Kirke Corin, MD  Allergies  Allergen Reactions  . Aspirin     constipation    History reviewed: allergies, current medications, past family history, past medical history, past social history, past surgical history and problem list  Chronic issues discussed:  Hypertension-compliant with lisinopril HCT without complaint.  No chest pain, no  edema  Hyperlipidemia-compliant with Crestor 40 mg daily.  Takes over-the-counter fish oil not Vascepa prescription which is listed  She is taking vitamin D supplement  Not exercising  She continues to smoke but no shortness of breath, no dyspnea  Acute issues discussed: "neuropahty" in back of legs, feels drawing of leg, cramps, burning.  Feels toes drawing, burning in bottom of feet, nubmness    Objective:      [facility]  BP 130/76   Pulse 83   Ht 5\' 4"  (1.626 m)   Wt 168 lb (76.2 kg)   SpO2 98%   BMI 28.84 kg/m   Cognitive Testing  Alert? Yes  Normal Appearance?Yes  Oriented to person? Yes  Place? Yes   Time? Yes  Recall of three objects?  Yes  Can perform simple calculations? Yes  Displays appropriate judgment?Yes  Can read the correct time from a watch face?Yes  General appearance: alert, no distress, WD/WN, white female  Nutritional Status: Inadequate calore intake? no Loss of muscle mass? no Loss of fat beneath skin? no Localized or general edema? no Diminished functional status? no  Other pertinent exam: HEENT: normocephalic, sclerae anicteric, TMs pearly, nares patent, no discharge or erythema, pharynx normal Oral cavity: MMM, no lesions, _dentures Neck: supple, no lymphadenopathy, no thyromegaly, no masses, no bruits Heart: RRR, normal S1, S2, no murmurs Lungs: CTA bilaterally, no wheezes, rhonchi, or rales Abdomen: +bs, soft, non tender, non distended, no masses, no hepatomegaly, no splenomegaly Musculoskeletal: nontender, no swelling, no obvious deformity Extremities:  Mild varicose veins and spider veins bilat LE, no edema, no cyanosis, no clubbing, good cap refill Pulses: 2+ symmetric, upper and bilat 1+  lower extremities, normal cap refill Neurological: decreased sensation of bilat feet to monofilament, othewrise  alert, oriented x 3, CN2-12 intact, strength normal upper extremities and lower extremities, sensation normal throughout, DTRs 2+  throughout, no cerebellar signs, gait normal Psychiatric: normal affect, behavior normal, pleasant  Breast/gyn- declined   Assessment:   Encounter Diagnoses  Name Primary?  . Encounter for health maintenance examination in adult Yes  . Medicare annual wellness visit, subsequent   . Encounter for screening mammogram for malignant neoplasm of breast   . Neuropathy   . Paresthesia of both lower extremities   . Essential hypertension   . PVD (peripheral vascular disease) ([place])   . Impaired fasting blood sugar   . Osteoporosis without current pathological fracture, unspecified osteoporosis type   . Tobacco use disorder   . Mixed dyslipidemia   . History of back surgery   . Advance directive discussed with patient   . Estrogen deficiency   . Vitamin D deficiency   . S/P hysterectomy   . Vaccine counseling   . Encounter for screening for lung cancer   . Leg cramping   . Varicose veins of both lower extremities, unspecified whether complicated   . Spider veins   . Ringing in ear, bilateral      Plan:   A preventative services visit was completed today.  During the course of the visit today, we discussed and counseled about appropriate screening and preventive services.  A health risk assessment was established today that included a review of current medications, allergies, social history, family history, medical and preventative health history, biometrics, and preventative screenings to identify potential safety concerns or impairments.  A personalized plan was printed today for your records and use.   Personalized health advice and education was given today to reduce health risks and promote self management and wellness.  Information regarding end of life planning was discussed today.   Patient Instructions  Vaccines:  We recommend a yearly flu shot  You are due for both Prevnar 13 and Pneumococcal 23 pneumonia vaccines  You are due for tetanus booster, or Tdap booster if  around babies or young children as this contains protection against pertussis/whooping cough  You are due for Shingrix shingles vaccine as well.  Shingles vaccine:  I recommend you have a shingles vaccine to help prevent shingles or herpes zoster outbreak.   Please call your insurer to inquire about coverage for the Shingrix vaccine given in 2 doses.   Some insurers cover this vaccine after age 42, some cover this after age 79.  If your insurer covers this, then call to schedule appointment to have this vaccine here.   Cancer Screening: I recommend mammogram this year and next.  After age 39, there is not enough evidence in the literature to recommend for or against breast cancer screening.  It is a personal decision after age 55 whether to do mammograms  Please call to schedule your mammogram   [facility]  [id number], [place], [place] [address]    I recommend monthly self breast exam  I recommend a repeat colon cancer screen next year in 2022 with Dr. Loletha Carrow.  After that screen, it will be up to Dr. Loletha Carrow and you whether to continue these  Continue to check your skin for new moles, changing moles.   It may be worth seeing a dermatologist for baseline screening.  Let me know if you want to pursue this  I recommend a chest CT lung cancer screen given history of tobacco use long term.  I am going to try to schedule this   General recommendations See your eye doctor yearly  See your dentist regularly  We are checking routine labs today  Please get me a copy of your advance directives, living will and healthcare power of attorney    Specific conditions: High blood pressure  continue your current medication  Limit salt intake  Limit or reduce animal products   High cholesterol and high triglycerides  Continue Crestor  You are currently taking fish oil.  Sometimes this helps your numbers, sometimes not.  I will  let you know if you need to switch to a prescription fish oil   Vitamin D deficiency  Continue supplement vitamin D daily   Neuropathy  I am checking some specific labs today  Begin gabapentin 100 mg at bedtime.  After 2 weeks you can increase to 200 mg at bedtime  I am making a referral to neurology for further evaluation   Tobacco use  I recommend you quit smoking completely  This greatly increases your risk of COPD and lung cancer  We are going to schedule you for a chest CT lung cancer screen  After the CT I may ask you to have a breathing test to check your lung volumes.  This is called a spirometry   Peripheral vascular disease  This can lead to decreased blood flow in the legs and potential problems with ulcers or amputations  The treatment is to quit smoking and to exercise regularly   Impaired glucose/at risk for diabetes-we are checking labs today  Ringing in ear - no cerumen.  Consider other eval or ENT consult.  Not enough time on today's visit to address   Osteoporosis, vitamin D deficiency, estrogen deficiency  I recommend regular exercise including weightbearing and aerobic exercise  Continue vitamin D supplement  I recommend getting 1200 mg of calcium daily either through dairy or supplement vitamin  Your bone density test 2018 showed osteoporosis.  The standard of care recommend you be on a medicine to decrease the rate of bone loss/bone resorption.  You are at high risk for fracture if you fall or get injured  I recommend you start a medicine such as Fosamax, Boniva or Prolia  Please review the information below and let me know what you want to do    Taking medications to treat osteoporosis reduces fracture risk by 50-70%!   1 in 49 American women and 1 in 77 men over age 74 years old have osteoporosis.    Recommendations:   Regular exercise including aerobic and weightbearing exercise, vitamin D supplementation, limiting alcohol and not  smoking.    We generally recommend adding a medication at this point to reduce your risk of fracture by reducing the rate of bone turnover or helping to increase bone mass.  Examples would be Fosamax weekly oral medication, Prolia injection every 6 months, or other options.   I want you to consider the options below, then let me know what you may want to do.   All of the medications have potential risks, but they all have the benefit of slowing down the rate of bone turnover as we get older, helping to reduce the risk of a bone fracture.  Options for therapy:   Bisphosphonate such as alendronate (Fosamax) This is a medication given weekly that strengthens bones by slowing down the rate of bone loss.  This is usually the first-line medication given the lower cost and does not require injection.  Many people tolerate this medication but some do not.  This medicine has to be taken weekly first thing in the morning on empty stomach, and you cannot lie down for an hour after taking the medication.  Risks of the medication includes trouble swallowing the medication, inflammation of the esophagus, gastric ulcers, and rare risk of breakdown of the jawbone.  These medications are usually given for 5 years (3 years if injectable bisphosphonate like Reclast).   Evenity  Compared with bisphosphonate's, this type of medication called monoclonal antibodies produce similar or better bone density results and reduces the chance of all types of fractures. Evenity is delivered via a shot under the skin every month.   Recent research indicates there could be a high risk of spinal column fractures after stopping the drug.  A very rare complication of bisphosphonate's and denosumab is a break or crack in the middle of the thighbone.  A second rare complication is delayed healing of the jawbone (osteonecrosis of the jaw). This can occur after an invasive dental procedure such as removing a tooth.   Forteo This medication  increases bone density and strength, it is a synthetic version of the parathyroid hormone, and it is given as a daily injection for 2 years.  Risk include leg cramps, nausea, dizziness, elevated calcium, joint pain, and rare risk of bone cancer in animal studies.  Prolia This is an injection given twice yearly that prevents bone dissolving osteoclast cells from forming.  Its a good option for women who can't tolerate bisphosphonate.  The advantages not having to take something every day or every month and it is well-tolerated for the most part.  This type of medication is called a monoclonal antibody, so there are risk of low blood calcium, skin infections, rash, and rarer potential risks are breakdown or death of jawbone, and rare risk of fracture of the thigh bone.  This medication is given ongoing.  Calcitonin  This is an old drug that helps prevent bone loss, used as a daily nasal spray or injection.  It  reduces spinal fractures, but is not effective in other types of fractures so it is usually  not the first-line option.  Side effects can include flushing, rash.  There is a small increase in cancer risk with this medication.  Evista This is a selective estrogen receptor modulator (SERM), and is used in breast cancer prevention and treatment.  It is also used in treatment of osteoporosis.   It is used in women to reduce risk of vertebral fractures.  Side effects include hot flashes, muscle pain, and increased risks of blood clots in the leg.                Tomeca was seen today for [employer].  Diagnoses and all orders for this visit:  Encounter for health maintenance examination in adult -     MM DIGITAL SCREENING BILATERAL; Future -     CT CHEST LUNG CA SCREEN LOW DOSE W/O CM; Future -     Comprehensive metabolic panel -     CBC with Differential/Platelet -     Lipid panel -     Vitamin B12 -     Hemoglobin A1c -     Sedimentation rate  Medicare annual wellness  visit, subsequent  Encounter for screening mammogram for malignant neoplasm of breast -     MM DIGITAL SCREENING BILATERAL; Future  Neuropathy -     Vitamin B12 -     Sedimentation rate -     Ambulatory referral to Neurology  Paresthesia of both lower extremities -     Ambulatory referral to Neurology  Essential hypertension  PVD (peripheral vascular disease) ([facility])  Impaired fasting blood sugar -     Hemoglobin A1c  Osteoporosis without current pathological fracture, unspecified osteoporosis type  Tobacco use disorder -     CT CHEST LUNG CA SCREEN LOW DOSE W/O CM; Future  Mixed dyslipidemia -     Lipid panel  History of back surgery  Advance directive discussed with patient  Estrogen deficiency  Vitamin D deficiency  S/P hysterectomy  Vaccine counseling  Encounter for screening for lung cancer -     CT CHEST LUNG CA SCREEN LOW DOSE W/O CM; Future  Leg cramping -     Ambulatory referral to Neurology  Varicose veins of both lower extremities, unspecified whether complicated  Spider veins  Ringing in ear, bilateral  Other orders -     gabapentin (NEURONTIN) 100 MG capsule; Take 2 capsules (200 mg total) by mouth at bedtime. -     cholecalciferol (VITAMIN D3) 25 MCG (1000 UNIT) tablet; Take 1 tablet (1,000 Units total) by mouth daily. -     rosuvastatin (CRESTOR) 40 MG tablet; Take 1 tablet (40 mg total) by mouth daily.      Medicare Attestation A preventative services visit was completed today.  During the course of the visit the patient was educated and counseled about appropriate screening and preventive services.  A health risk assessment was established with the patient that included a review of current medications, allergies, social history, family history, medical and preventative health history, biometrics, and preventative screenings to identify potential safety concerns or impairments.  A personalized plan was printed today for the patient's  records and use.   Personalized health advice and education was given today to reduce health risks and promote self management and wellness.  Information regarding end of life planning was discussed today.  Dorothea Ogle, PA-C   05/25/2020

## 2020-05-25 NOTE — Patient Instructions (Addendum)
Vaccines:  We recommend a yearly flu shot  You are due for both Prevnar 13 and Pneumococcal 23 pneumonia vaccines  You are due for tetanus booster, or Tdap booster if around babies or young children as this contains protection against pertussis/whooping cough  You are due for Shingrix shingles vaccine as well.    Shingles vaccine:  I recommend you have a shingles vaccine to help prevent shingles or herpes zoster outbreak.   Please call your insurer to inquire about coverage for the Shingrix vaccine given in 2 doses.   Some insurers cover this vaccine after age 79, some cover this after age 63.  If your insurer covers this, then call to schedule appointment to have this vaccine here.   Cancer Screening: I recommend mammogram this year and next.  After age 75, there is not enough evidence in the literature to recommend for or against breast cancer screening.  It is a personal decision after age 77 whether to do mammograms  Please call to schedule your mammogram   [facility]  [phone] [place], [place] [address]    I recommend monthly self breast exam  I recommend a repeat colon cancer screen next year in 2022 with Dr. Loletha Carrow.  After that screen, it will be up to Dr. Loletha Carrow and you whether to continue these  Continue to check your skin for new moles, changing moles.   It may be worth seeing a dermatologist for baseline screening.  Let me know if you want to pursue this  I recommend a chest CT lung cancer screen given history of tobacco use long term.  I am going to try to schedule this   General recommendations See your eye doctor yearly  See your dentist regularly  We are checking routine labs today  Please get me a copy of your advance directives, living will and healthcare power of attorney    Specific conditions: High blood pressure  continue your current medication  Limit salt intake  Limit or reduce animal  products   High cholesterol and high triglycerides  Continue Crestor  You are currently taking fish oil.  Sometimes this helps your numbers, sometimes not.  I will let you know if you need to switch to a prescription fish oil   Vitamin D deficiency  Continue supplement vitamin D daily   Neuropathy  I am checking some specific labs today  Begin gabapentin 100 mg at bedtime.  After 2 weeks you can increase to 200 mg at bedtime  I am making a referral to neurology for further evaluation   Tobacco use  I recommend you quit smoking completely  This greatly increases your risk of COPD and lung cancer  We are going to schedule you for a chest CT lung cancer screen  After the CT I may ask you to have a breathing test to check your lung volumes.  This is called a spirometry   Peripheral vascular disease  This can lead to decreased blood flow in the legs and potential problems with ulcers or amputations  The treatment is to quit smoking and to exercise regularly   Impaired glucose/at risk for diabetes-we are checking labs today  Ringing in ear - no cerumen.  Consider other eval or ENT consult.  Not enough time on today's visit to address   Osteoporosis, vitamin D deficiency, estrogen deficiency  I recommend regular exercise including weightbearing and aerobic exercise  Continue vitamin D supplement  I recommend getting 1200 mg of calcium daily either through dairy or supplement vitamin  Your bone density test 2018 showed osteoporosis.  The standard of care recommend you be on a medicine to decrease the rate of bone loss/bone resorption.  You are at high risk for fracture if you fall or get injured  I recommend you start a medicine such as Fosamax, Boniva or Prolia  Please review the information below and let me know what you want to do    Taking medications to treat osteoporosis reduces fracture risk by 50-70%!   1 in 86 American women and 1 in 79 men over age 54  years old have osteoporosis.    Recommendations:   Regular exercise including aerobic and weightbearing exercise, vitamin D supplementation, limiting alcohol and not smoking.    We generally recommend adding a medication at this point to reduce your risk of fracture by reducing the rate of bone turnover or helping to increase bone mass.  Examples would be Fosamax weekly oral medication, Prolia injection every 6 months, or other options.   I want you to consider the options below, then let me know what you may want to do.   All of the medications have potential risks, but they all have the benefit of slowing down the rate of bone turnover as we get older, helping to reduce the risk of a bone fracture.  Options for therapy:   Bisphosphonate such as alendronate (Fosamax) This is a medication given weekly that strengthens bones by slowing down the rate of bone loss.  This is usually the first-line medication given the lower cost and does not require injection.  Many people tolerate this medication but some do not.  This medicine has to be taken weekly first thing in the morning on empty stomach, and you cannot lie down for an hour after taking the medication.  Risks of the medication includes trouble swallowing the medication, inflammation of the esophagus, gastric ulcers, and rare risk of breakdown of the jawbone.  These medications are usually given for 5 years (3 years if injectable bisphosphonate like Reclast).   Evenity  Compared with bisphosphonate's, this type of medication called monoclonal antibodies produce similar or better bone density results and reduces the chance of all types of fractures. Evenity is delivered via a shot under the skin every month.   Recent research indicates there could be a high risk of spinal column fractures after stopping the drug.  A very rare complication of bisphosphonate's and denosumab is a break or crack in the middle of the thighbone.  A second rare  complication is delayed healing of the jawbone (osteonecrosis of the jaw). This can occur after an invasive dental procedure such as removing a tooth.   Forteo This medication increases bone density and strength, it is a synthetic version of the parathyroid hormone, and it is given as a daily injection for 2 years.  Risk include leg cramps, nausea, dizziness, elevated calcium, joint pain, and rare risk of bone cancer in animal studies.  Prolia This is an injection given twice yearly that prevents bone dissolving osteoclast cells from forming.  Its a good option for women who can't tolerate bisphosphonate.  The advantages not having to take something every day or every month and it is well-tolerated for the most part.  This type of medication is called a monoclonal antibody, so there are risk of low blood calcium, skin infections, rash, and rarer potential risks are breakdown or death of  jawbone, and rare risk of fracture of the thigh bone.  This medication is given ongoing.  Calcitonin  This is an old drug that helps prevent bone loss, used as a daily nasal spray or injection.  It  reduces spinal fractures, but is not effective in other types of fractures so it is usually not the first-line option.  Side effects can include flushing, rash.  There is a small increase in cancer risk with this medication.  Evista This is a selective estrogen receptor modulator (SERM), and is used in breast cancer prevention and treatment.  It is also used in treatment of osteoporosis.   It is used in women to reduce risk of vertebral fractures.  Side effects include hot flashes, muscle pain, and increased risks of blood clots in the leg.

## 2020-05-25 NOTE — Progress Notes (Signed)
Done

## 2020-05-26 ENCOUNTER — Other Ambulatory Visit: Payer: Self-pay | Admitting: Medical

## 2020-05-26 LAB — CBC WITH DIFFERENTIAL/PLATELET
Basophils Absolute: 0 10*3/uL (ref 0.0–0.2)
Basos: 0 %
EOS (ABSOLUTE): 0.1 10*3/uL (ref 0.0–0.4)
Eos: 1 %
Hematocrit: 39 % (ref 34.0–46.6)
Hemoglobin: 13.5 g/dL (ref 11.1–15.9)
Immature Grans (Abs): 0 10*3/uL (ref 0.0–0.1)
Immature Granulocytes: 0 %
Lymphocytes Absolute: 2.1 10*3/uL (ref 0.7–3.1)
Lymphs: 27 %
MCH: 32.5 pg (ref 26.6–33.0)
MCHC: 34.6 g/dL (ref 31.5–35.7)
MCV: 94 fL (ref 79–97)
Monocytes Absolute: 0.6 10*3/uL (ref 0.1–0.9)
Monocytes: 7 %
Neutrophils Absolute: 4.9 10*3/uL (ref 1.4–7.0)
Neutrophils: 65 %
Platelets: 278 10*3/uL (ref 150–450)
RBC: 4.15 x10E6/uL (ref 3.77–5.28)
RDW: 12.5 % (ref 11.7–15.4)
WBC: 7.7 10*3/uL (ref 3.4–10.8)

## 2020-05-26 LAB — COMPREHENSIVE METABOLIC PANEL
ALT: 29 IU/L (ref 0–32)
AST: 25 IU/L (ref 0–40)
Albumin/Globulin Ratio: 2 (ref 1.2–2.2)
Albumin: 4.8 g/dL — ABNORMAL HIGH (ref 3.7–4.7)
Alkaline Phosphatase: 65 IU/L (ref 44–121)
BUN/Creatinine Ratio: 19 (ref 12–28)
BUN: 22 mg/dL (ref 8–27)
Bilirubin Total: 0.4 mg/dL (ref 0.0–1.2)
CO2: 23 mmol/L (ref 20–29)
Calcium: 9.9 mg/dL (ref 8.7–10.3)
Chloride: 104 mmol/L (ref 96–106)
Creatinine, Ser: 1.13 mg/dL — ABNORMAL HIGH (ref 0.57–1.00)
GFR calc Af Amer: 56 mL/min/{1.73_m2} — ABNORMAL LOW (ref >59)
GFR calc non Af Amer: 49 mL/min/{1.73_m2} — ABNORMAL LOW (ref >59)
Globulin, Total: 2.4 g/dL (ref 1.5–4.5)
Glucose: 106 mg/dL — ABNORMAL HIGH (ref 65–99)
Potassium: 4.7 mmol/L (ref 3.5–5.2)
Sodium: 142 mmol/L (ref 134–144)
Total Protein: 7.2 g/dL (ref 6.0–8.5)

## 2020-05-26 LAB — LIPID PANEL
Chol/HDL Ratio: 3.2 ratio (ref 0.0–4.4)
Cholesterol, Total: 116 mg/dL (ref 100–199)
HDL: 36 mg/dL — ABNORMAL LOW (ref >39)
LDL Chol Calc (NIH): 52 mg/dL (ref 0–99)
Triglycerides: 169 mg/dL — ABNORMAL HIGH (ref 0–149)
VLDL Cholesterol Cal: 28 mg/dL (ref 5–40)

## 2020-05-26 LAB — VITAMIN B12: Vitamin B-12: >2000 pg/mL — ABNORMAL HIGH (ref 232–1245)

## 2020-05-26 LAB — HEMOGLOBIN A1C
Est. average glucose Bld gHb Est-mCnc: 123 mg/dL
Hgb A1c MFr Bld: 5.9 % — ABNORMAL HIGH (ref 4.8–5.6)

## 2020-05-26 LAB — SEDIMENTATION RATE: Sed Rate: 4 mm/hr (ref 0–40)

## 2020-05-26 MED ORDER — OMEGA-3-ACID ETHYL ESTERS 1 G PO CAPS
2.0000 g | ORAL_CAPSULE | Freq: Two times a day (BID) | ORAL | 5 refills | Status: DC
Start: 2020-05-26 — End: 2020-08-08

## 2020-05-26 MED ORDER — LISINOPRIL-HYDROCHLOROTHIAZIDE 20-12.5 MG PO TABS: 1.0000 | ORAL_TABLET | Freq: Every day | ORAL | 1 refills | Status: DC

## 2020-05-29 NOTE — Addendum Note (Signed)
Addended by: Carlena Hurl on: 05/29/2020 01:38 PM   Modules accepted: Level of Service

## 2020-06-17 ENCOUNTER — Other Ambulatory Visit: Payer: Self-pay | Admitting: Medical

## 2020-06-17 DIAGNOSIS — R69 Illness, unspecified: Secondary | ICD-10-CM | POA: Diagnosis not present

## 2020-06-30 ENCOUNTER — Ambulatory Visit: Payer: Medicare Other

## 2020-06-30 DIAGNOSIS — Z20822 Contact with and (suspected) exposure to covid-19: Secondary | ICD-10-CM | POA: Diagnosis not present

## 2020-07-03 ENCOUNTER — Ambulatory Visit: Payer: Medicare HMO

## 2020-07-07 ENCOUNTER — Other Ambulatory Visit: Payer: Self-pay

## 2020-07-07 ENCOUNTER — Encounter: Payer: Self-pay | Admitting: Medical

## 2020-07-07 ENCOUNTER — Ambulatory Visit (INDEPENDENT_AMBULATORY_CARE_PROVIDER_SITE_OTHER): Payer: Medicare HMO | Admitting: Medical

## 2020-07-07 ENCOUNTER — Encounter: Payer: Self-pay | Admitting: Internal Medicine

## 2020-07-07 VITALS — BP 134/76 | HR 78 | Ht 64.0 in | Wt 168.8 lb

## 2020-07-07 DIAGNOSIS — M810 Age-related osteoporosis without current pathological fracture: Secondary | ICD-10-CM | POA: Diagnosis not present

## 2020-07-07 DIAGNOSIS — G629 Polyneuropathy, unspecified: Secondary | ICD-10-CM

## 2020-07-07 DIAGNOSIS — R7989 Other specified abnormal findings of blood chemistry: Secondary | ICD-10-CM

## 2020-07-07 DIAGNOSIS — F17200 Nicotine dependence, unspecified, uncomplicated: Secondary | ICD-10-CM | POA: Insufficient documentation

## 2020-07-07 DIAGNOSIS — M79604 Pain in right leg: Secondary | ICD-10-CM | POA: Diagnosis not present

## 2020-07-07 DIAGNOSIS — I739 Peripheral vascular disease, unspecified: Secondary | ICD-10-CM | POA: Diagnosis not present

## 2020-07-07 DIAGNOSIS — I8393 Asymptomatic varicose veins of bilateral lower extremities: Secondary | ICD-10-CM | POA: Diagnosis not present

## 2020-07-07 DIAGNOSIS — I809 Phlebitis and thrombophlebitis of unspecified site: Secondary | ICD-10-CM

## 2020-07-07 DIAGNOSIS — I781 Nevus, non-neoplastic: Secondary | ICD-10-CM | POA: Diagnosis not present

## 2020-07-07 DIAGNOSIS — I10 Essential (primary) hypertension: Secondary | ICD-10-CM | POA: Diagnosis not present

## 2020-07-07 DIAGNOSIS — E782 Mixed hyperlipidemia: Secondary | ICD-10-CM | POA: Diagnosis not present

## 2020-07-07 DIAGNOSIS — Z122 Encounter for screening for malignant neoplasm of respiratory organs: Secondary | ICD-10-CM

## 2020-07-07 NOTE — Progress Notes (Signed)
Subjective:  Alyssa Carter is a 74 y.o. female who presents for Chief Complaint  Patient presents with  . Follow-up    recheck labs   . Leg Pain    right leg     Here for recheck.  Last visit her kidney marker had bumped up a little bit.  I had her change her lisinopril HCT with a new dose having small amount of diuretic.  She is compliant with this.  However she ran out about a week ago started back on her lisinopril HCT 20/25.  She is drinking plenty of water.  She continues to smoke.  Her main complaint today is a pain in the lower right leg anteriorly.  She denies injury or fall or trauma.  Feels like her right anterior lower leg is on fire.  Symptoms times 2 weeks, anterior lower leg.  She denies injury, no fall.  Using combination of ice, heat, elevating the leg and ibuprofen.  She is compliant with her medications for blood pressure and cholesterol  She has an appointment next month for her chest CT lung cancer screening.  Regarding osteoporosis, she is not interested in bisphosphonate medications  No other aggravating or relieving factors.    No other c/o.  Past Medical History:  Diagnosis Date  . Dyslipidemia   . Full dentures   . H/O bone density study 8/14   never  . H/O mammogram 2010  . Hematuria    microscopic, several prior evaluations, no source or cause found  . Hyperlipidemia   . Hypertension   . Impaired fasting blood sugar 2008  . Influenza vaccine side effect    intolerance, declines  . Tobacco use   . Wears glasses    Current Outpatient Medications on File Prior to Visit  Medication Sig Dispense Refill  . cholecalciferol (VITAMIN D3) 25 MCG (1000 UNIT) tablet Take 1 tablet (1,000 Units total) by mouth daily. 90 tablet 3  . gabapentin (NEURONTIN) 100 MG capsule Take 2 capsules (200 mg total) by mouth at bedtime. 60 capsule 1  . lisinopril-hydrochlorothiazide (ZESTORETIC) 20-12.5 MG tablet Take 1 tablet by mouth daily. 30 tablet 1  . Multiple  Vitamin (MULTIVITAMIN WITH MINERALS) TABS tablet Take 1 tablet by mouth daily.    . Omega-3 Fatty Acids (FISH OIL) 600 MG CAPS Take by mouth.    . rosuvastatin (CRESTOR) 40 MG tablet Take 1 tablet (40 mg total) by mouth daily. 90 tablet 3  . omega-3 acid ethyl esters (LOVAZA) 1 g capsule Take 2 capsules (2 g total) by mouth 2 (two) times daily. (Patient not taking: Reported on 07/07/2020) 120 capsule 5   Current Facility-Administered Medications on File Prior to Visit  Medication Dose Route Frequency Provider Last Rate Last Admin  . 0.9 %  sodium chloride infusion  500 mL Intravenous Continuous Danis, Estill Cotta III, MD         The following portions of the patient's history were reviewed and updated as appropriate: allergies, current medications, past family history, past medical history, past social history, past surgical history and problem list.  ROS Otherwise as in subjective above  Objective: BP 134/76   Pulse 78   Ht 5\' 4"  (1.626 m)   Wt 168 lb 12.8 oz (76.6 kg)   SpO2 98%   BMI 28.97 kg/m   General appearance: alert, no distress, well developed, well nourished Right anterior distal lower leg with a small patch of pinkish-purple capillaries and small palpable tender blood vessel superficially.  There is a tattoo just lateral to this area of tenderness.  There is no obvious erythema induration or fluctuance.  There is scattered spider veins and varicose veins of bilateral lower legs.  No asymmetry of bilateral calfs or legs.  Otherwise legs nontender normal range of motion Ext: no edema   Assessment: Encounter Diagnoses  Name Primary?  . Phlebitis Yes  . Right leg pain   . Varicose veins of both lower extremities, unspecified whether complicated   . Spider veins   . Elevated serum creatinine   . Essential hypertension   . PVD (peripheral vascular disease) ([place])   . Neuropathy   . Osteoporosis without current pathological fracture, unspecified osteoporosis type   . Mixed  dyslipidemia   . Screening for lung cancer   . Smoker      Plan: Phlebitis, right leg pain-symptoms and exam suggest superficial phlebitis.  She will use heat and elevation.  Even though she has listed aspirin in the allergies she really just has constipation is her adverse reaction.  She is willing to give this a try along with taking some MiraLAX.  I gave her samples of MiraLAX.  She is going to use aspirin for the next 5 to 7 days along with leg elevation and heat for the phlebitis.  Referral back to vascular surgery for updated consult on peripheral vascular disease, smoking, varicose veins and leg pains.  She continues on gabapentin for neuropathic pain of the legs  She unfortunately continues to smoke  Osteoporosis-she currently is not interested in pursuing any current medication.  She continues to use exercise and diet as a way to keep bones strong.  She has been advised about fall prevention  Hypertension-continue current medication  Recheck on labs today given recent bump up in creatinine.  Last visit we decreased her diuretic from 25 to 12.5 mg of hydrochlorothiazide in the combo drug lisinopril HCT.  Advise she avoid NSAIDs  Her appointment for lung cancer CT screening is soon  Alyssa Carter was seen today for follow-up and leg pain.  Diagnoses and all orders for this visit:  Phlebitis  Right leg pain  Varicose veins of both lower extremities, unspecified whether complicated  Spider veins  Elevated serum creatinine -     Renal Function Panel  Essential hypertension  PVD (peripheral vascular disease) ([facility])  Neuropathy  Osteoporosis without current pathological fracture, unspecified osteoporosis type  Mixed dyslipidemia  Screening for lung cancer  Smoker    Follow up: pending labs

## 2020-07-07 NOTE — Patient Instructions (Addendum)
Your exam and symptoms suggest Phelbisit which is inflammation of a vein in the superficial part of your lower leg.  This is NOT the same a deep vein blood clot.  Recommendations:  Elevated the leg on a stool or pillow a few times daily for the next several days  Apply warm moist towel or heat pad to the leg while you elevate the leg.  Or at least use heat therapy daily for the next 5-7 days  Even though aspirin constpiates you, Aspirin is the preferred treatment for this.   We typicaly use Aspirin 325mg  daily for 5- 7 days.   This would be 4 baby aspirins (each 81mg ).   If you want to try this, take some miralax daily for the next week to help prevent constipation  If you decide not to use Aspirin, use Tylenol 500mg  for a few days

## 2020-07-07 NOTE — Progress Notes (Signed)
Patient has been referred back.

## 2020-07-08 LAB — RENAL FUNCTION PANEL
Albumin: 5 g/dL — ABNORMAL HIGH (ref 3.7–4.7)
BUN/Creatinine Ratio: 18 (ref 12–28)
BUN: 19 mg/dL (ref 8–27)
CO2: 24 mmol/L (ref 20–29)
Calcium: 10.5 mg/dL — ABNORMAL HIGH (ref 8.7–10.3)
Chloride: 101 mmol/L (ref 96–106)
Creatinine, Ser: 1.05 mg/dL — ABNORMAL HIGH (ref 0.57–1.00)
GFR calc Af Amer: 61 mL/min/{1.73_m2} (ref >59)
GFR calc non Af Amer: 53 mL/min/{1.73_m2} — ABNORMAL LOW (ref >59)
Glucose: 96 mg/dL (ref 65–99)
Phosphorus: 3.7 mg/dL (ref 3.0–4.3)
Potassium: 4.9 mmol/L (ref 3.5–5.2)
Sodium: 140 mmol/L (ref 134–144)

## 2020-07-10 ENCOUNTER — Ambulatory Visit
Admission: RE | Admit: 2020-07-10 | Discharge: 2020-07-10 | Disposition: A | Discharge status: Home / Self Care | Payer: Medicare Other | Source: Ambulatory Visit | Attending: Medical | Admitting: Medical

## 2020-07-10 ENCOUNTER — Other Ambulatory Visit: Payer: Self-pay | Admitting: Medical

## 2020-07-10 ENCOUNTER — Telehealth: Payer: Self-pay | Admitting: Medical

## 2020-07-10 ENCOUNTER — Other Ambulatory Visit: Payer: Self-pay

## 2020-07-10 DIAGNOSIS — Z0000 Encounter for general adult medical examination without abnormal findings: Secondary | ICD-10-CM

## 2020-07-10 DIAGNOSIS — Z1231 Encounter for screening mammogram for malignant neoplasm of breast: Secondary | ICD-10-CM

## 2020-07-10 MED ORDER — AMLODIPINE BESY-BENAZEPRIL HCL 5-20 MG PO CAPS: 1.0000 | ORAL_CAPSULE | Freq: Every day | ORAL | 1 refills | Status: DC

## 2020-07-10 NOTE — Telephone Encounter (Signed)
See lab message as well.  I want to refer her back to vascular surgery for updated consult in regards to her legs.  She saw them several years ago but is probably time to go back and do a recheck given her ongoing leg issues.  Look back at her last vascular surgery note to know where to refer her back

## 2020-07-26 ENCOUNTER — Other Ambulatory Visit: Payer: Self-pay | Admitting: Medical

## 2020-07-26 NOTE — Telephone Encounter (Signed)
Pt. Requesting refill on Gabapentin last apt was 07/07/20.

## 2020-07-28 ENCOUNTER — Telehealth: Payer: Self-pay | Admitting: Medical

## 2020-07-28 ENCOUNTER — Other Ambulatory Visit: Payer: Self-pay | Admitting: Medical

## 2020-07-28 MED ORDER — LISINOPRIL-HYDROCHLOROTHIAZIDE 20-12.5 MG PO TABS: 1.0000 | ORAL_TABLET | Freq: Every day | ORAL | 2 refills | Status: DC

## 2020-07-28 NOTE — Telephone Encounter (Signed)
Has this been done? Still in my inbox pending?

## 2020-07-28 NOTE — Telephone Encounter (Signed)
Called pt. LM stating to call back for info about BP med and swelling.

## 2020-07-28 NOTE — Telephone Encounter (Signed)
Let switch back to lisinopril HCT.  Have her check her blood pressure a few times per week and let me know in 1 month what her blood pressures are running?

## 2020-07-28 NOTE — Telephone Encounter (Signed)
We had stopped her lisinopril HCT and lowered the dose of the fluid pill to help improve her kidney marker.  Her kidney marker did improve after these changes and then we changed to the current medicine to help get her blood pressure under control.  At this point we have a couple options: 1-if the swelling is not terrible she can use compression hose daily and continue to exercise such as walking regularly 2-I could add back a low-dose fluid pill  I would not necessarily want to stop the current medicine as it that is a good combination medicine.  But if she is seeing significant swelling, one other option would be to remove the amlodipine portion of her medication and change back to something similar to what she was on but with the lower dose of the fluid pill given the kidney function

## 2020-07-28 NOTE — Telephone Encounter (Signed)
Pt. Aware of switch in medication and to check BP twice a week for 1 month.

## 2020-07-28 NOTE — Telephone Encounter (Signed)
Pt. Called back stating that she is not taking that new BP combo pill any more since starting it she has had bad swelling in both feet. She wanted to know if you could call her in a low dose fluid pill and change her BP medicine back to what she was on or something different but a low dose. She said her BP has been fine.

## 2020-07-28 NOTE — Telephone Encounter (Signed)
Please call patient She states new BP meds causing her feet and legs to swell

## 2020-07-31 NOTE — Telephone Encounter (Signed)
Let her know so she can call them back

## 2020-07-31 NOTE — Telephone Encounter (Signed)
Patient was referred but it does not seem they were able to reach patient to schedule

## 2020-08-01 NOTE — Telephone Encounter (Signed)
Left detail message on machine for patient to call and schedule appointment with vascular.

## 2020-08-06 ENCOUNTER — Other Ambulatory Visit: Payer: Self-pay | Admitting: Medical

## 2020-08-08 ENCOUNTER — Other Ambulatory Visit: Payer: Self-pay

## 2020-08-08 ENCOUNTER — Encounter: Payer: Self-pay | Admitting: Neurology

## 2020-08-08 ENCOUNTER — Ambulatory Visit: Payer: Medicare HMO | Admitting: Neurology

## 2020-08-08 ENCOUNTER — Telehealth: Payer: Self-pay | Admitting: Neurology

## 2020-08-08 VITALS — BP 119/63 | HR 82 | Ht 64.0 in | Wt 168.0 lb

## 2020-08-08 DIAGNOSIS — M79604 Pain in right leg: Secondary | ICD-10-CM | POA: Diagnosis not present

## 2020-08-08 DIAGNOSIS — R202 Paresthesia of skin: Secondary | ICD-10-CM | POA: Diagnosis not present

## 2020-08-08 NOTE — Progress Notes (Signed)
Chief Complaint  Patient presents with  . New Patient (Initial Visit)    Reports numbness, tingling and burning in her bilateral lower extremities (right side worse than left). Recently diagnosed with Phlebitis by PCP. She took aspirin 364m daily for two weeks but is no longer using it. Her cramping has improved with gabapentin 1056m two capsules at bedtime.   . Marland KitchenCP    Tysinger, Alyssa Carter    HISTORICAL  Alyssa NATARAJANs a [age]ear old female, seen in request by her primary care PA Alyssa LloydDaCamelia Engor evaluation of bilateral lower extremity paresthesia, muscle cramping, initial evaluation was August 08, 2020.  I reviewed and summarized the referring note.  Past medical history Hypertension Hyperlipidemia Lumbar decompression surgery for  right radiculopathy  She had a history of right lumbar radiculopathy many years presented with right low back pain, radiating pain to right lower extremity after a sneeze, symptoms has much relieved after lumbar decompression surgery  In August 2021, she began to notice bothersome bilateral lower extremity numbness, right more than left, she complains of numbness at her toes, bottom of her feet, right anterior shin area tenderness with deep palpitation, she was diagnosed with phlebitis by her primary care physician, is referred to vein specialist  She complains of early morning right leg pain, difficulty bearing weight, has to take a few steps to loosen it up, also complains of right leg swelling,  Laboratory evaluation showed creatinine of 1.05, normal ESR of 4, A1c of 5.9, B12 more than 2000, lipid panel showed triglyceride 169, LDL 52, hemoglobin of 13.5, CMP, creatinine of 1.13.   REVIEW OF SYSTEMS: Full 14 system review of systems performed and notable only for as above All other review of systems were negative.  ALLERGIES: Allergies  Allergen Reactions  . Aspirin     constipation    HOME MEDICATIONS: Current Outpatient  Medications  Medication Sig Dispense Refill  . gabapentin (NEURONTIN) 100 MG capsule TAKE 2 CAPSULES (200 MG TOTAL) BY MOUTH AT BEDTIME. 60 capsule 1  . lisinopril-hydrochlorothiazide (ZESTORETIC) 20-12.5 MG tablet Take 1 tablet by mouth daily. 30 tablet 2  . Multiple Vitamin (MULTIVITAMIN WITH MINERALS) TABS tablet Take 1 tablet by mouth daily.    . Omega-3 Fatty Acids (FISH OIL) 600 MG CAPS Take by mouth.    . rosuvastatin (CRESTOR) 40 MG tablet Take 1 tablet (40 mg total) by mouth daily. 90 tablet 3  . VITAMIN D PO Take 2,000 Units by mouth daily.     No current facility-administered medications for this visit.    PAST MEDICAL HISTORY: Past Medical History:  Diagnosis Date  . Bilateral leg pain   . Dyslipidemia   . Full dentures   . H/O bone density study 8/14   never  . H/O mammogram 2010  . Hematuria    microscopic, several prior evaluations, no source or cause found  . Hyperlipidemia   . Hypertension   . Impaired fasting blood sugar 2008  . Influenza vaccine side effect    intolerance, declines  . Tobacco use   . Wears glasses     PAST SURGICAL HISTORY: Past Surgical History:  Procedure Laterality Date  . ABDOMINAL HYSTERECTOMY  1980   total; due to heavy bleeding  . APPENDECTOMY  1980  . KNEE SURGERY     right  . SHOULDER SURGERY    . SPINE SURGERY     lumbar  . TONSILLECTOMY    . TUBAL LIGATION  FAMILY HISTORY: Family History  Problem Relation Age of Onset  . Alzheimer's disease Mother   . Other Mother        died of UTI, dehydration  . Other Father 44       failure to thrive, old age, fall and rib fracture  . Diabetes Father   . Diabetes Sister   . Cancer Brother        brain, stomach  . Colon cancer Neg Hx     SOCIAL HISTORY: Social History   Socioeconomic History  . Marital status: Legally Separated    Spouse name: Not on file  . Number of children: 2  . Years of education: 49  . Highest education level: High school graduate   Occupational History  . Occupation: Receptionist - part time  Tobacco Use  . Smoking status: Current Every Day Smoker    Packs/day: 25.00    Years: 40.00    Pack years: 1000.00    Types: Cigarettes  . Smokeless tobacco: Never Used  Vaping Use  . Vaping Use: Never used  Substance and Sexual Activity  . Alcohol use: No  . Drug use: No  . Sexual activity: Not on file  Other Topics Concern  . Not on file  Social History Narrative   Lives alone (son moved out).  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   Right-handed.   No daily caffeine use.    Social Determinants of Health   Financial Resource Strain:   . Difficulty of Paying Living Expenses: Not on file  Food Insecurity:   . Worried About Charity fundraiser in the Last Year: Not on file  . Ran Out of Food in the Last Year: Not on file  Transportation Needs:   . Lack of Transportation (Medical): Not on file  . Lack of Transportation (Non-Medical): Not on file  Physical Activity:   . Days of Exercise per Week: Not on file  . Minutes of Exercise per Session: Not on file  Stress:   . Feeling of Stress : Not on file  Social Connections:   . Frequency of Communication with Friends and Family: Not on file  . Frequency of Social Gatherings with Friends and Family: Not on file  . Attends Religious Services: Not on file  . Active Member of Clubs or Organizations: Not on file  . Attends Archivist Meetings: Not on file  . Marital Status: Not on file  Intimate Partner Violence:   . Fear of Current or Ex-Partner: Not on file  . Emotionally Abused: Not on file  . Physically Abused: Not on file  . Sexually Abused: Not on file     PHYSICAL EXAM   Vitals:   08/08/20 1001  BP: 119/63  Pulse: 82  Weight: 168 lb (76.2 kg)  Height: _0  (1.626 m)   Not recorded     Body mass index is 28.84 kg/m.  PHYSICAL EXAMNIATION:  Gen: NAD, conversant, well nourised, well groomed                      Cardiovascular: Regular rate rhythm, no peripheral edema, warm, nontender. Eyes: Conjunctivae clear without exudates or hemorrhage Neck: Supple, no carotid bruits. Pulmonary: Clear to auscultation bilaterally   NEUROLOGICAL EXAM:  MENTAL STATUS: Speech:    Speech is normal; fluent and spontaneous with normal comprehension.  Cognition:     Orientation to time, place and person     Normal  recent and remote memory     Normal Attention span and concentration     Normal Language, naming, repeating,spontaneous speech     Fund of knowledge   CRANIAL NERVES: CN II: Visual fields are full to confrontation. Pupils are round equal and briskly reactive to light. CN III, IV, VI: extraocular movement are normal. No ptosis. CN V: Facial sensation is intact to light touch CN VII: Face is symmetric with normal eye closure  CN VIII: Hearing is normal to causal conversation. CN IX, X: Phonation is normal. CN XI: Head turning and shoulder shrug are intact  MOTOR: Right lower extremity anterior shin area tenderness upon deep palpitation, mild swelling of right leg, no significant bilateral upper and lower extremity proximal and distal muscle weakness.  REFLEXES: Reflexes are 2+ and symmetric at the biceps, triceps, knees, and trace ankles. Plantar responses are flexor.  SENSORY: Length dependent decreased to light touch, pinprick and vibratory sensation to distal shin area  COORDINATION: There is no trunk or limb dysmetria noted.  GAIT/STANCE: Mildly antalgic, able to bear weight tiptoe and heels.   DIAGNOSTIC DATA (LABS, IMAGING, TESTING) - I reviewed patient records, labs, notes, testing and imaging myself where available.   ASSESSMENT AND PLAN  Alyssa Carter is a 74 y.o. female   History of right lumbar radiculopathy, Recurrent right more than left leg paresthesia  Length dependent sensory changes, decreased bilateral ankle reflexes,  Differentiation diagnosis  including peripheral neuropathy, versus right lumbosacral radiculopathy  Proceed with MRI of lumbar spine  EMG nerve conduction study   Marcial Pacas, M.D. Ph.D.  [facility] [address], [place], [place] [address] Ph: [phone] Fax: [phone]  CC:  Carlena Hurl, PA-C [address] [place],  [place] [address]

## 2020-08-08 NOTE — Telephone Encounter (Signed)
[employer] medicare order sent to [facility]. They will obtain the auth and reach out to the patient to schedule.  °

## 2020-08-09 ENCOUNTER — Other Ambulatory Visit: Payer: Self-pay | Admitting: Neurology

## 2020-08-09 ENCOUNTER — Telehealth: Payer: Self-pay | Admitting: Medical

## 2020-08-09 NOTE — Telephone Encounter (Signed)
Pt called and states that she is having a MRI on the 11th and states she needs something to help her relax before going in pt uses [id number] - [place], [place] [address]

## 2020-08-10 ENCOUNTER — Other Ambulatory Visit: Payer: Self-pay | Admitting: Medical

## 2020-08-10 MED ORDER — ALPRAZOLAM 0.5 MG PO TABS: 0.5000 mg | ORAL_TABLET | ORAL | 0 refills | Status: DC | PRN

## 2020-08-10 NOTE — Telephone Encounter (Signed)
Pt. Aware medication has been sent in already and to take 30 minutes prior to procedure.

## 2020-08-10 NOTE — Telephone Encounter (Signed)
I sent Xanax to use 30 minutes prior to procedure

## 2020-08-19 ENCOUNTER — Other Ambulatory Visit: Payer: Medicare HMO

## 2020-08-21 NOTE — Telephone Encounter (Signed)
[employer] medicare did not approve the MRI Lumbar spine. I called [employer] to see if there was an option to do a peer to peer. They stated no because it is a medicare plan the peer to peer would not change the denial reason. An appeal would need to be done..case number [id number]   The reason of the denial. "Based on medicare guidelines greater than six months post-operative, we cannot approve this request. Your records show that you have had a repair (surgery) to treat a problem with your spine. The request cannot be approved because: you have not completed six weeks of treatment directed by your doctor that failed to improve your symptoms. Supported treatments include (but not limited to). Medications for swelling or pain, physical therapy, and/or oral or injected steroids."  If you would like to write a letter I will fax it along with notes for the appeal process.

## 2020-08-23 ENCOUNTER — Encounter: Payer: Self-pay | Admitting: *Deleted

## 2020-08-23 NOTE — Telephone Encounter (Signed)
Letter of MRI-lumbar necessity was done

## 2020-08-24 NOTE — Telephone Encounter (Signed)
Faxed all the information for an appeal.

## 2020-08-28 ENCOUNTER — Encounter: Payer: Medicare HMO | Admitting: Neurology

## 2020-08-28 NOTE — Telephone Encounter (Signed)
The appeal was approved. Holli Humbles: [id number] (exp. 08/25/20 to 02/23/21) order sent to [facility] for them to reschedule the patient.

## 2020-08-30 ENCOUNTER — Ambulatory Visit: Payer: Medicare HMO | Admitting: Neurology

## 2020-08-30 ENCOUNTER — Ambulatory Visit (INDEPENDENT_AMBULATORY_CARE_PROVIDER_SITE_OTHER): Payer: Medicare HMO | Admitting: Neurology

## 2020-08-30 ENCOUNTER — Other Ambulatory Visit: Payer: Self-pay

## 2020-08-30 DIAGNOSIS — R252 Cramp and spasm: Secondary | ICD-10-CM | POA: Diagnosis not present

## 2020-08-30 DIAGNOSIS — M79604 Pain in right leg: Secondary | ICD-10-CM

## 2020-08-30 DIAGNOSIS — R209 Unspecified disturbances of skin sensation: Secondary | ICD-10-CM | POA: Diagnosis not present

## 2020-08-30 DIAGNOSIS — R6889 Other general symptoms and signs: Secondary | ICD-10-CM | POA: Diagnosis not present

## 2020-08-30 DIAGNOSIS — R202 Paresthesia of skin: Secondary | ICD-10-CM

## 2020-08-30 DIAGNOSIS — M545 Low back pain, unspecified: Secondary | ICD-10-CM | POA: Diagnosis not present

## 2020-08-30 MED ORDER — GABAPENTIN 300 MG PO CAPS: 600.0000 mg | ORAL_CAPSULE | Freq: Every day | ORAL | 11 refills | Status: DC

## 2020-08-30 NOTE — Procedures (Signed)
Full Name: Alyssa Carter Gender: Female MRN #: [id number] Date of Birth: August 13, 1946    Visit Date: 08/30/2020 08:39 Age: 74 Years Examining Physician: Marcial Pacas, MD  Referring Physician: Marcial Pacas, MD History: 74 year old female, with history of lumbar decompression surgery, now presented with intermittent low back pain, frequent bilateral foot, calf muscle cramping  Summary of the test:  Nerve conduction study:  Bilateral sural, superficial peroneal sensory responses were normal.  Bilateral peroneal to EDB and tibial motor responses were normal  Bilateral H reflexes were present and symmetric.  Electromyography:  Selected needle examination of bilateral lower extremity muscles and bilateral lumbosacral paraspinal muscles were normal.  During the needle examination, she has frequent calf muscle cramping  Conclusion: This is a normal study.  There was no evidence of bilateral lower extremity neuropathy, large fiber peripheral neuropathy or bilateral lumbosacral radiculopathy.    ------------------------------- Marcial Pacas, M.D. PhD  [facility] [address], [place] [address] Tel: [phone] Fax: [phone]  Verbal informed consent was obtained from the patient, patient was informed of potential risk of procedure, including bruising, bleeding, hematoma formation, infection, muscle weakness, muscle pain, numbness, among others.         [place]    Nerve / Sites Muscle Latency Ref. Amplitude Ref. Rel Amp Segments Distance Velocity Ref. Area    ms ms mV mV %  cm m/s m/s mVms  R Peroneal - EDB     Ankle EDB 4.4 ?6.5 6.1 ?2.0 100 Ankle - EDB 9   22.3     Fib head EDB 9.6  5.4  88.3 Fib head - Ankle 26 50 ?44 22.0     Pop fossa EDB 11.7  5.3  98 Pop fossa - Fib head 10 49 ?44 21.6         Pop fossa - Ankle      L Peroneal - EDB     Ankle EDB 4.1 ?6.5 7.4 ?2.0 100 Ankle - EDB 9   24.9     Fib head EDB 9.4  6.6  89.2 Fib head - Ankle 26 49  ?44 23.3     Pop fossa EDB 11.5  5.6  83.6 Pop fossa - Fib head 10 48 ?44 19.0         Pop fossa - Ankle      R Tibial - AH     Ankle AH 4.0 ?5.8 11.2 ?4.0 100 Ankle - AH 9   23.9     Pop fossa AH 12.9  3.8  33.5 Pop fossa - Ankle 37 42 ?41 10.9  L Tibial - AH     Ankle AH 5.2 ?5.8 10.5 ?4.0 100 Ankle - AH 9   20.7     Pop fossa AH 13.2  7.2  68.9 Pop fossa - Ankle 36 45 ?41 17.1             SSR    Nerve / Sites Latency   s  L Sympathetic - Foot     Foot 2.40          SNC    Nerve / Sites Rec. Site Peak Lat Ref.  Amp Ref. Segments Distance    ms ms V V  cm  R Sural - Ankle (Calf)     Calf Ankle 3.8 ?4.4 7 ?6 Calf - Ankle 14  L Sural - Ankle (Calf)     Calf Ankle 3.9 ?4.4 7 ?6 Calf - Ankle  14  R Superficial peroneal - Ankle     Lat leg Ankle 3.9 ?4.4 6 ?6 Lat leg - Ankle 14  L Superficial peroneal - Ankle     Lat leg Ankle 4.0 ?4.4 6 ?6 Lat leg - Ankle 14             F  Wave    Nerve F Lat Ref.   ms ms  R Tibial - AH 50.7 ?56.0  L Tibial - AH 50.4 ?56.0         H Reflex    Nerve H Lat   ms   Left Right Ref.  Tibial - [place] 37.7 37.5 ?35.0         EMG Summary Table    Spontaneous MUAP Recruitment  Muscle IA Fib PSW Fasc Other Amp Dur. Poly Pattern  R. Tibialis anterior Normal None None None _______ Normal Normal Normal Normal  R. Tibialis posterior Normal None None None _______ Normal Normal Normal Normal  R. Peroneus longus Normal None None None _______ Normal Normal Normal Normal  R. Gastrocnemius (Medial head) Normal None None None _______ Normal Normal Normal Normal  R. Vastus lateralis Normal None None None _______ Normal Normal Normal Normal  L. Tibialis anterior Normal None None None _______ Normal Normal Normal Normal  L. Tibialis posterior Normal None None None _______ Normal Normal Normal Normal  L. Gastrocnemius (Medial head) Normal None None None _______ Normal Normal Normal Normal  L. Peroneus longus Normal None None None _______ Normal Normal  Normal Normal  L. Vastus lateralis Normal None None None _______ Normal Normal Normal Normal  R. Lumbar paraspinals (low) Normal None None None _______ Normal Normal Normal Normal  R. Lumbar paraspinals (mid) Normal None None None _______ Normal Normal Normal Normal  L. Lumbar paraspinals (low) Normal None None None _______ Normal Normal Normal Normal  L. Lumbar paraspinals (mid) Normal None None None _______ Normal Normal Normal Normal

## 2020-08-30 NOTE — Progress Notes (Signed)
HISTORICAL  Alyssa Carter is a 74 year old female, seen in request by her primary care PA Glade Lloyd, Camelia Eng for evaluation of bilateral lower extremity paresthesia, muscle cramping, initial evaluation was August 08, 2020.  I reviewed and summarized the referring note.  Past medical history Hypertension Hyperlipidemia Lumbar decompression surgery for  right radiculopathy  She had a history of right lumbar radiculopathy many years presented with right low back pain, radiating pain to right lower extremity after a sneeze, symptoms has much relieved after lumbar decompression surgery  In August 2021, she began to notice bothersome bilateral lower extremity numbness, right more than left, she complains of numbness at her toes, bottom of her feet, right anterior shin area tenderness with deep palpitation, she was diagnosed with phlebitis by her primary care physician, is referred to vein specialist  She complains of early morning right leg pain, difficulty bearing weight, has to take a few steps to loosen it up, also complains of right leg swelling,  Laboratory evaluation showed creatinine of 1.05, normal ESR of 4, A1c of 5.9, B12 more than 2000, lipid panel showed triglyceride 169, LDL 52, hemoglobin of 13.5, CMP, creatinine of 1.13.  Update August 30, 2020: She return for electrodiagnostic study today, which was essentially normal, but during needle examination, she has frequent painful bilateral calf and feet muscle cramping,  She has been taking gabapentin 100 mg 2 tablets every night, without significant benefit, muscle cramping mainly happen at nighttime  She has mild to moderate low back pain, especially after prolonged sitting, she denies gait abnormality or incontinence, has persistent bilateral foot numbness   REVIEW OF SYSTEMS: Full 14 system review of systems performed and notable only for as above All other review of systems were negative.  ALLERGIES: Allergies   Allergen Reactions  . Aspirin     constipation    HOME MEDICATIONS: Current Outpatient Medications  Medication Sig Dispense Refill  . ALPRAZolam (XANAX) 0.5 MG tablet Take 1 tablet (0.5 mg total) by mouth as needed for anxiety or sleep. 30 min Prior to procedure 5 tablet 0  . gabapentin (NEURONTIN) 100 MG capsule TAKE 2 CAPSULES (200 MG TOTAL) BY MOUTH AT BEDTIME. 60 capsule 1  . lisinopril-hydrochlorothiazide (ZESTORETIC) 20-12.5 MG tablet Take 1 tablet by mouth daily. 30 tablet 2  . Multiple Vitamin (MULTIVITAMIN WITH MINERALS) TABS tablet Take 1 tablet by mouth daily.    . Omega-3 Fatty Acids (FISH OIL) 600 MG CAPS Take by mouth.    . rosuvastatin (CRESTOR) 40 MG tablet Take 1 tablet (40 mg total) by mouth daily. 90 tablet 3  . VITAMIN D PO Take 2,000 Units by mouth daily.     No current facility-administered medications for this visit.    PAST MEDICAL HISTORY: Past Medical History:  Diagnosis Date  . Bilateral leg pain   . Dyslipidemia   . Full dentures   . H/O bone density study 8/14   never  . H/O mammogram 2010  . Hematuria    microscopic, several prior evaluations, no source or cause found  . Hyperlipidemia   . Hypertension   . Impaired fasting blood sugar 2008  . Influenza vaccine side effect    intolerance, declines  . Tobacco use   . Wears glasses     PAST SURGICAL HISTORY: Past Surgical History:  Procedure Laterality Date  . ABDOMINAL HYSTERECTOMY  1980   total; due to heavy bleeding  . APPENDECTOMY  1980  . KNEE SURGERY     right  .  SHOULDER SURGERY    . SPINE SURGERY     lumbar  . TONSILLECTOMY    . TUBAL LIGATION      FAMILY HISTORY: Family History  Problem Relation Age of Onset  . Alzheimer's disease Mother   . Other Mother        died of UTI, dehydration  . Other Father 5       failure to thrive, old age, fall and rib fracture  . Diabetes Father   . Diabetes Sister   . Cancer Brother        brain, stomach  . Colon cancer Neg Hx      SOCIAL HISTORY: Social History   Socioeconomic History  . Marital status: Legally Separated    Spouse name: Not on file  . Number of children: 2  . Years of education: 77  . Highest education level: High school graduate  Occupational History  . Occupation: Receptionist - part time  Tobacco Use  . Smoking status: Current Every Day Smoker    Packs/day: 25.00    Years: 40.00    Pack years: 1000.00    Types: Cigarettes  . Smokeless tobacco: Never Used  Vaping Use  . Vaping Use: Never used  Substance and Sexual Activity  . Alcohol use: No  . Drug use: No  . Sexual activity: Not on file  Other Topics Concern  . Not on file  Social History Narrative   Lives alone (son moved out).  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   Right-handed.   No daily caffeine use.    Social Determinants of Health   Financial Resource Strain: Not on file  Food Insecurity: Not on file  Transportation Needs: Not on file  Physical Activity: Not on file  Stress: Not on file  Social Connections: Not on file  Intimate Partner Violence: Not on file     PHYSICAL EXAM   There were no vitals filed for this visit. Not recorded     There is no height or weight on file to calculate BMI.  PHYSICAL EXAMNIATION:  Gen: NAD, conversant, well nourised, well groomed NEUROLOGICAL EXAM:  MENTAL STATUS: Speech/cognition: Awake, alert, oriented to history taking and casual conversation   CRANIAL NERVES: CN II: Visual fields are full to confrontation. Pupils are round equal and briskly reactive to light. CN III, IV, VI: extraocular movement are normal. No ptosis. CN V: Facial sensation is intact to light touch CN VII: Face is symmetric with normal eye closure  CN VIII: Hearing is normal to causal conversation. CN IX, X: Phonation is normal. CN XI: Head turning and shoulder shrug are intact  MOTOR: No significant bilateral upper and lower extremity  weakness   REFLEXES: Reflexes are 2+ and symmetric at the biceps, triceps, knees, and trace ankles. Plantar responses are flexor.  SENSORY: Length dependent decreased to light touch, pinprick and vibratory sensation to distal shin area  COORDINATION: There is no trunk or limb dysmetria noted.  GAIT/STANCE: Mildly antalgic, able to bear weight tiptoe and heels.   DIAGNOSTIC DATA (LABS, IMAGING, TESTING) - I reviewed patient records, labs, notes, testing and imaging myself where available.   ASSESSMENT AND PLAN  Alyssa Carter is a 74 y.o. female   History of right lumbar radiculopathy, Recurrent right more than left leg paresthesia  MRI of lumbar pending  Laboratory evaluation today, including vitamin D, magnesium level,  Increase gabapentin to 300 mg 2 tablets every night  Return with Judson Roch  in 3 months   Marcial Pacas, M.D. Ph.D.  [facility] [address], [place], [place] [address] Ph: [phone] Fax: [phone]  CC:  Carlena Hurl, PA-C [address] [place],  [place] [address]

## 2020-08-30 NOTE — Addendum Note (Signed)
Addended by: Marcial Pacas on: 08/30/2020 10:10 AM   Modules accepted: Level of Service

## 2020-08-31 ENCOUNTER — Other Ambulatory Visit: Payer: Self-pay

## 2020-08-31 ENCOUNTER — Ambulatory Visit
Admission: RE | Admit: 2020-08-31 | Discharge: 2020-08-31 | Disposition: A | Discharge status: Home / Self Care | Payer: Medicare HMO | Source: Ambulatory Visit | Attending: Neurology | Admitting: Neurology

## 2020-08-31 DIAGNOSIS — M79604 Pain in right leg: Secondary | ICD-10-CM

## 2020-08-31 DIAGNOSIS — R202 Paresthesia of skin: Secondary | ICD-10-CM

## 2020-08-31 DIAGNOSIS — M545 Low back pain, unspecified: Secondary | ICD-10-CM | POA: Diagnosis not present

## 2020-08-31 LAB — VITAMIN D 25 HYDROXY (VIT D DEFICIENCY, FRACTURES): Vit D, 25-Hydroxy: 32.1 ng/mL (ref 30.0–100.0)

## 2020-08-31 LAB — CK: Total CK: 100 U/L (ref 32–182)

## 2020-08-31 LAB — MAGNESIUM: Magnesium: 2 mg/dL (ref 1.6–2.3)

## 2020-09-04 ENCOUNTER — Encounter (HOSPITAL_COMMUNITY): Payer: Medicare HMO

## 2020-09-06 ENCOUNTER — Telehealth: Payer: Self-pay | Admitting: *Deleted

## 2020-09-06 NOTE — Telephone Encounter (Signed)
-----   Message from Micki Riley, MD sent at 09/06/2020  4:33 PM EST ----- Kindly inform the patient that MRI scan of the lumbar spine shows changes of wear and tear at the disc of multiple sites most prominent between the third and the fourth vertebrae where there is moderate narrowing of the spinal canal and possible nerve root impingement on the right.  This could potentially explain her symptoms.  Have the patient call back to discuss with Dr. Terrace Arabia after she gets back from vacation further course of action.  If her symptoms get significantly worse asked her to call for urgent referral to neurosurgeon or spine surgeon

## 2020-09-06 NOTE — Progress Notes (Signed)
Kindly inform the patient that MRI scan of the lumbar spine shows changes of wear and tear at the disc of multiple sites most prominent between the third and the fourth vertebrae where there is moderate narrowing of the spinal canal and possible nerve root impingement on the right.  This could potentially explain her symptoms.  Have the patient call back to discuss with Dr. Terrace Arabia after she gets back from vacation further course of action.  If her symptoms get significantly worse asked her to call for urgent referral to neurosurgeon or spine surgeon

## 2020-09-06 NOTE — Telephone Encounter (Signed)
I spoke to the patient and she verbalized understanding of the MRI findings.   Her symptoms of numbness and tingling are really bothersome. She is interested in proceeding with a neurosurgical consultation, if Dr. Terrace Arabia feels it would be beneficial.

## 2020-09-18 ENCOUNTER — Telehealth: Payer: Self-pay | Admitting: Medical

## 2020-09-18 ENCOUNTER — Other Ambulatory Visit: Payer: Self-pay

## 2020-09-18 MED ORDER — ROSUVASTATIN CALCIUM 40 MG PO TABS
40.0000 mg | ORAL_TABLET | Freq: Every day | ORAL | 2 refills | Status: DC
Start: 2020-09-18 — End: 2020-10-30

## 2020-09-18 MED ORDER — LISINOPRIL-HYDROCHLOROTHIAZIDE 20-12.5 MG PO TABS
1.0000 | ORAL_TABLET | Freq: Every day | ORAL | 1 refills | Status: DC
Start: 2020-09-18 — End: 2020-10-20

## 2020-09-18 MED ORDER — GABAPENTIN 300 MG PO CAPS: 600.0000 mg | ORAL_CAPSULE | Freq: Every day | ORAL | 10 refills | Status: DC

## 2020-09-18 NOTE — Telephone Encounter (Signed)
Spoke with patient. She said the higher dose of Gabapentin hasn't really helped. She still has the leg cramping when she lays down at night and when she she puts her feet down after they have been up when she is in the recliner. The pt scheduled a follow up visit to review MRIs in 4 weeks with Dr Krista Blue on Thurs 10/19/20 @ 9:00 AM. Pt verbalized appreciation for the call.

## 2020-09-18 NOTE — Telephone Encounter (Signed)
Medication has been sent and pharmacy has been updated per patient request.

## 2020-09-18 NOTE — Telephone Encounter (Signed)
Please check on the patient's response to higher dose of gabapentin, as it helped her leg cramping  Also give her a follow-up appointment with me in 4 to 6 weeks to review MRIs

## 2020-09-18 NOTE — Telephone Encounter (Signed)
Pt called and states that she has changed her pharmacy and all medications now need to go to [facility] on [address] in [place].

## 2020-10-19 ENCOUNTER — Ambulatory Visit: Payer: Medicare Other | Admitting: Neurology

## 2020-10-19 ENCOUNTER — Encounter: Payer: Self-pay | Admitting: Neurology

## 2020-10-19 ENCOUNTER — Telehealth: Payer: Self-pay | Admitting: Neurology

## 2020-10-19 ENCOUNTER — Other Ambulatory Visit: Payer: Self-pay | Admitting: *Deleted

## 2020-10-19 VITALS — BP 126/62 | HR 80 | Ht 64.0 in | Wt 169.5 lb

## 2020-10-19 DIAGNOSIS — M5412 Radiculopathy, cervical region: Secondary | ICD-10-CM | POA: Diagnosis not present

## 2020-10-19 DIAGNOSIS — R202 Paresthesia of skin: Secondary | ICD-10-CM

## 2020-10-19 DIAGNOSIS — M5416 Radiculopathy, lumbar region: Secondary | ICD-10-CM | POA: Diagnosis not present

## 2020-10-19 MED ORDER — GABAPENTIN 300 MG PO CAPS: 600.0000 mg | ORAL_CAPSULE | Freq: Every day | ORAL | 4 refills | Status: DC

## 2020-10-19 NOTE — Progress Notes (Addendum)
HISTORICAL  Alyssa Carter is a 75 year old female, seen in request by her primary care PA Glade Lloyd, Camelia Eng for evaluation of bilateral lower extremity paresthesia, muscle cramping, initial evaluation was August 08, 2020.  I reviewed and summarized the referring note.  Past medical history Hypertension Hyperlipidemia Lumbar decompression surgery for  right radiculopathy  She had a history of right lumbar radiculopathy many years presented with right low back pain, radiating pain to right lower extremity after a sneeze, symptoms has much relieved after lumbar decompression surgery  In August 2021, she began to notice bothersome bilateral lower extremity numbness, right more than left, she complains of numbness at her toes, bottom of her feet, right anterior shin area tenderness with deep palpitation, she was diagnosed with phlebitis by her primary care physician, is referred to vein specialist  She complains of early morning right leg pain, difficulty bearing weight, has to take a few steps to loosen it up, also complains of right leg swelling,  Laboratory evaluation showed creatinine of 1.05, normal ESR of 4, A1c of 5.9, B12 more than 2000, lipid panel showed triglyceride 169, LDL 52, hemoglobin of 13.5, CMP, creatinine of 1.13.  Update August 30, 2020: She return for electrodiagnostic study today, which was essentially normal, but during needle examination, she has frequent painful bilateral calf and feet muscle cramping,  She has been taking gabapentin 100 mg 2 tablets every night, without significant benefit, muscle cramping mainly happen at nighttime  She has mild to moderate low back pain, especially after prolonged sitting, she denies gait abnormality or incontinence, has persistent bilateral foot numbness  UPDATE Oct 19 2020: She continue complains of right foot numbness, involving plantar surface, toes, cramping extending to calf, gabapentin 600 mg every night has helped  her sleep, but sometimes complains of mostly drowsiness  We personally reviewed MRI lumbar in December 2021, multilevel degenerative changes, moderate spinal stenosis L3-4, with moderately severe right lateral recess stenosis, potential right L4 nerve root compression, evidence of previous right hemilaminectomy at L4-5, mild anterolisthesis and other degenerative changes.  REVIEW OF SYSTEMS: Full 14 system review of systems performed and notable only for as above All other review of systems were negative.  ALLERGIES: Allergies  Allergen Reactions  . Aspirin     constipation    HOME MEDICATIONS: Current Outpatient Medications  Medication Sig Dispense Refill  . gabapentin (NEURONTIN) 300 MG capsule Take 2 capsules (600 mg total) by mouth at bedtime. 60 capsule 10  . lisinopril-hydrochlorothiazide (ZESTORETIC) 20-12.5 MG tablet Take 1 tablet by mouth daily. 30 tablet 1  . Multiple Vitamin (MULTIVITAMIN WITH MINERALS) TABS tablet Take 1 tablet by mouth daily.    . Omega-3 Fatty Acids (FISH OIL) 600 MG CAPS Take by mouth.    . rosuvastatin (CRESTOR) 40 MG tablet Take 1 tablet (40 mg total) by mouth daily. 90 tablet 2  . VITAMIN D PO Take 2,000 Units by mouth daily.     No current facility-administered medications for this visit.    PAST MEDICAL HISTORY: Past Medical History:  Diagnosis Date  . Bilateral leg pain   . Dyslipidemia   . Full dentures   . H/O bone density study 8/14   never  . H/O mammogram 2010  . Hematuria    microscopic, several prior evaluations, no source or cause found  . Hyperlipidemia   . Hypertension   . Impaired fasting blood sugar 2008  . Influenza vaccine side effect    intolerance, declines  . Tobacco  use   . Wears glasses     PAST SURGICAL HISTORY: Past Surgical History:  Procedure Laterality Date  . ABDOMINAL HYSTERECTOMY  1980   total; due to heavy bleeding  . APPENDECTOMY  1980  . KNEE SURGERY     right  . SHOULDER SURGERY    . SPINE  SURGERY     lumbar  . TONSILLECTOMY    . TUBAL LIGATION      FAMILY HISTORY: Family History  Problem Relation Age of Onset  . Alzheimer's disease Mother   . Other Mother        died of UTI, dehydration  . Other Father 80       failure to thrive, old age, fall and rib fracture  . Diabetes Father   . Diabetes Sister   . Cancer Brother        brain, stomach  . Colon cancer Neg Hx     SOCIAL HISTORY: Social History   Socioeconomic History  . Marital status: Legally Separated    Spouse name: Not on file  . Number of children: 2  . Years of education: 22  . Highest education level: High school graduate  Occupational History  . Occupation: Receptionist - part time  Tobacco Use  . Smoking status: Current Every Day Smoker    Packs/day: 25.00    Years: 40.00    Pack years: 1000.00    Types: Cigarettes  . Smokeless tobacco: Never Used  Vaping Use  . Vaping Use: Never used  Substance and Sexual Activity  . Alcohol use: No  . Drug use: No  . Sexual activity: Not on file  Other Topics Concern  . Not on file  Social History Narrative   Lives alone (son moved out).  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   Right-handed.   No daily caffeine use.    Social Determinants of Health   Financial Resource Strain: Not on file  Food Insecurity: Not on file  Transportation Needs: Not on file  Physical Activity: Not on file  Stress: Not on file  Social Connections: Not on file  Intimate Partner Violence: Not on file     PHYSICAL EXAM   Vitals:   10/19/20 0854  BP: 126/62  Pulse: 80  Weight: 169 lb 8 oz (76.9 kg)  Height: 5' 4"  (1.626 m)   Not recorded     Body mass index is 29.09 kg/m.  PHYSICAL EXAMNIATION:  Gen: NAD, conversant, well nourised, well groomed NEUROLOGICAL EXAM:  MENTAL STATUS: Speech/cognition: Awake, alert, oriented to history taking and casual conversation   CRANIAL NERVES: CN II: Visual fields are full  to confrontation. Pupils are round equal and briskly reactive to light. CN III, IV, VI: extraocular movement are normal. No ptosis. CN V: Facial sensation is intact to light touch CN VII: Face is symmetric with normal eye closure  CN VIII: Hearing is normal to causal conversation. CN IX, X: Phonation is normal. CN XI: Head turning and shoulder shrug are intact  MOTOR: No significant bilateral upper and lower extremity weakness   REFLEXES: Reflexes are 2+ and symmetric at the biceps, triceps, knees, and trace ankles. Plantar responses are flexor.  SENSORY: Length dependent decreased to light touch, pinprick and vibratory sensation to distal shin area  COORDINATION: There is no trunk or limb dysmetria noted.  GAIT/STANCE: Mildly antalgic, able to bear weight tiptoe and heels.   DIAGNOSTIC DATA (LABS, IMAGING, TESTING) - I reviewed patient records, labs,  notes, testing and imaging myself where available.   ASSESSMENT AND PLAN  Alyssa Carter is a 75 y.o. female   History of right lumbar radiculopathy, Recurrent right more than left leg paresthesia  MRI of lumbar showed multilevel degenerative changes, moderate spinal stenosis L3-4, moderately severe right lateral recess stenosis due to degenerative changes,  Gabapentin 600 mg every night has been helpful, refilled her medications,  There was no significant pain, no weakness, cauda surgical candidate, will return to clinic for worsening symptoms  Marcial Pacas, M.D. Ph.D.  [facility] [address] [place], [place] [address] Ph: [phone] Fax: [phone]  CC:  Carlena Hurl, PA-C [place],  [place] [id number]    Neurosurgical evaluation by Dr. Deri Fuelling on November 09, 2020, MRI of lumbar spine demonstrate postoperative change on the right at L4-5 with some worsening facet arthropathy, some lateral recess narrowing and stenosis, moderate disc degeneration with broad disc  bulging at L3-4 with right-sided lateral recess narrowing but no severe stenosis,  Dr. Trenton Gammon believe patient suffering low back symptoms due to stenosis at L3-4, L4-5 on the right, with intermittent radicular symptoms, refer her to a course of lumbar epidural steroid injection,

## 2020-10-19 NOTE — Telephone Encounter (Signed)
FYI: Pt's preferred pharmacy is the [facility] on [address] in [place] instead of [place].

## 2020-10-19 NOTE — Telephone Encounter (Signed)
I called the patient to confirm which of the two locations on [address] updated. Refills sent to requested pharmacy.

## 2020-10-20 ENCOUNTER — Other Ambulatory Visit: Payer: Self-pay | Admitting: Medical

## 2020-10-30 ENCOUNTER — Other Ambulatory Visit: Payer: Self-pay

## 2020-10-30 MED ORDER — LISINOPRIL-HYDROCHLOROTHIAZIDE 20-12.5 MG PO TABS: 1.0000 | ORAL_TABLET | Freq: Every day | ORAL | 0 refills | Status: DC

## 2020-10-30 MED ORDER — GABAPENTIN 300 MG PO CAPS: 600.0000 mg | ORAL_CAPSULE | Freq: Every day | ORAL | 4 refills | Status: DC

## 2020-10-30 MED ORDER — ROSUVASTATIN CALCIUM 40 MG PO TABS: 40.0000 mg | ORAL_TABLET | Freq: Every day | ORAL | 2 refills | Status: DC

## 2020-11-07 ENCOUNTER — Telehealth: Payer: Self-pay | Admitting: Neurology

## 2020-11-07 DIAGNOSIS — R202 Paresthesia of skin: Secondary | ICD-10-CM

## 2020-11-07 DIAGNOSIS — M5416 Radiculopathy, lumbar region: Secondary | ICD-10-CM

## 2020-11-07 DIAGNOSIS — M4800 Spinal stenosis, site unspecified: Secondary | ICD-10-CM

## 2020-11-07 NOTE — Telephone Encounter (Signed)
I notified the patient to expect a call from their office to schedule her appt.

## 2020-11-07 NOTE — Telephone Encounter (Signed)
Notes from visit on 10/19/20:  History of right lumbar radiculopathy, Recurrent right more than left leg paresthesia             MRI of lumbar showed multilevel degenerative changes, moderate spinal stenosis L3-4, moderately severe right lateral recess stenosis due to degenerative changes,             Gabapentin 600 mg every night has been helpful, refilled her medications,             There was no significant pain, no weakness, cauda surgical candidate, will return to clinic for worsening symptoms  ____________________________________________  Neurosurgical referral placed in Epic.

## 2020-11-07 NOTE — Telephone Encounter (Signed)
Pt called stating that in her last appt a surgeon was going to be recommended but she did not want to have surgery at the time but now she says that she is in a lot of pain and is wanting to go ahead with being referred to a surgeon. Pt would like to know if Dr. Trenton Gammon can be the provider that the referral can be sent in to. Please advise.

## 2020-12-31 ENCOUNTER — Other Ambulatory Visit: Payer: Self-pay | Admitting: Medical

## 2021-02-15 ENCOUNTER — Other Ambulatory Visit: Payer: Self-pay | Admitting: Medical

## 2021-02-27 ENCOUNTER — Telehealth: Payer: Self-pay

## 2021-02-27 ENCOUNTER — Other Ambulatory Visit: Payer: Self-pay

## 2021-02-27 MED ORDER — LISINOPRIL-HYDROCHLOROTHIAZIDE 20-12.5 MG PO TABS: 1.0000 | ORAL_TABLET | Freq: Every day | ORAL | 0 refills | Status: DC

## 2021-02-27 NOTE — Telephone Encounter (Signed)
Medication sent.

## 2021-02-27 NOTE — Telephone Encounter (Signed)
Pt called and scheduled med check for first available 03/28/21, pt will need refill Zestoretic (mail order)

## 2021-03-03 NOTE — Telephone Encounter (Signed)
done

## 2021-03-28 ENCOUNTER — Ambulatory Visit (INDEPENDENT_AMBULATORY_CARE_PROVIDER_SITE_OTHER): Payer: Medicare Other | Admitting: Medical

## 2021-03-28 ENCOUNTER — Other Ambulatory Visit: Payer: Self-pay

## 2021-03-28 VITALS — BP 120/70 | HR 85 | Ht 63.0 in | Wt 168.2 lb

## 2021-03-28 DIAGNOSIS — M21619 Bunion of unspecified foot: Secondary | ICD-10-CM | POA: Insufficient documentation

## 2021-03-28 DIAGNOSIS — Z720 Tobacco use: Secondary | ICD-10-CM

## 2021-03-28 DIAGNOSIS — I739 Peripheral vascular disease, unspecified: Secondary | ICD-10-CM

## 2021-03-28 DIAGNOSIS — I10 Essential (primary) hypertension: Secondary | ICD-10-CM | POA: Diagnosis not present

## 2021-03-28 DIAGNOSIS — M2141 Flat foot [pes planus] (acquired), right foot: Secondary | ICD-10-CM

## 2021-03-28 DIAGNOSIS — M5416 Radiculopathy, lumbar region: Secondary | ICD-10-CM

## 2021-03-28 DIAGNOSIS — M79671 Pain in right foot: Secondary | ICD-10-CM

## 2021-03-28 DIAGNOSIS — M7741 Metatarsalgia, right foot: Secondary | ICD-10-CM | POA: Diagnosis not present

## 2021-03-28 DIAGNOSIS — M21611 Bunion of right foot: Secondary | ICD-10-CM

## 2021-03-28 DIAGNOSIS — R202 Paresthesia of skin: Secondary | ICD-10-CM

## 2021-03-28 DIAGNOSIS — Z122 Encounter for screening for malignant neoplasm of respiratory organs: Secondary | ICD-10-CM

## 2021-03-28 DIAGNOSIS — M810 Age-related osteoporosis without current pathological fracture: Secondary | ICD-10-CM

## 2021-03-28 DIAGNOSIS — M2142 Flat foot [pes planus] (acquired), left foot: Secondary | ICD-10-CM

## 2021-03-28 MED ORDER — BUPROPION HCL ER (XL) 150 MG PO TB24: 150.0000 mg | ORAL_TABLET | Freq: Every day | ORAL | 1 refills | Status: DC

## 2021-03-28 MED ORDER — ALENDRONATE SODIUM 70 MG PO TABS: 70.0000 mg | ORAL_TABLET | ORAL | 2 refills | Status: DC

## 2021-03-28 NOTE — Progress Notes (Signed)
Subjective:  Alyssa Carter is a 75 y.o. female who presents for Chief Complaint  Patient presents with   med check    Med check. Wants bottom of foot looked at. Having pain     Here for med check on BP.   Compliant with lisinopril HCT 20/12.5mg  daily.  No chest pain, no dyspnea.      Dyslipidemia - Compliant with Crestor daily without c/o.  Takes 2 OTC fish oil capsules daily.    Has a sore foot.   She notes foot pain on ball of right foot x 2 weeks.  No injury, no trauma.  Can't put pressure on it.  Mainly hurts when standing or putting pressure on it.   Has felt swollen.   No fever.    She has hx/o chronic back pain, has prior had lumbar EDSI.  She continues gabapentin for radicular pain.  She did see neurology, Alyssa Carter in February  She wants medication to help quit smoking.  She has tried various methods of still not successful  She has not yet decided on osteoporosis treatment  She declines CT lung cancer screening  No other aggravating or relieving factors.    No other c/o.  Past Medical History:  Diagnosis Date   Bilateral leg pain    Dyslipidemia    Full dentures    H/O bone density study 8/14   never   H/O mammogram 2010   Hematuria    microscopic, several prior evaluations, no source or cause found   Hyperlipidemia    Hypertension    Impaired fasting blood sugar 2008   Influenza vaccine side effect    intolerance, declines   Tobacco use    Wears glasses    Current Outpatient Medications on File Prior to Visit  Medication Sig Dispense Refill   gabapentin (NEURONTIN) 300 MG capsule Take 2 capsules (600 mg total) by mouth at bedtime. 180 capsule 4   lisinopril-hydrochlorothiazide (ZESTORETIC) 20-12.5 MG tablet Take 1 tablet by mouth daily. 90 tablet 0   Multiple Vitamin (MULTIVITAMIN WITH MINERALS) TABS tablet Take 1 tablet by mouth daily.     rosuvastatin (CRESTOR) 40 MG tablet Take 1 tablet (40 mg total) by mouth daily. 90 tablet 2   No current  facility-administered medications on file prior to visit.     The following portions of the patient's history were reviewed and updated as appropriate: allergies, current medications, past family history, past medical history, past social history, past surgical history and problem list.  ROS Otherwise as in subjective above  Objective: BP 120/70   Pulse 85   Ht 5\' 3"  (1.6 m)   Wt 168 lb 3.2 oz (76.3 kg)   BMI 29.80 kg/m   Wt Readings from Last 3 Encounters:  03/28/21 168 lb 3.2 oz (76.3 kg)  10/19/20 169 lb 8 oz (76.9 kg)  08/08/20 168 lb (76.2 kg)    General appearance: alert, no distress, well developed, well nourished Heart: RRR, normal S1, S2, no murmurs Lungs: CTA bilaterally, no wheezes, rhonchi, or rales Pulses: 2+ radial pulses, 1+ pedal pulses, normal cap refill Right foot tender over volar MTP of 2nd - 3rd toes, bunion of right great toe and great toe does overlap a little with 2nd toe, pes planus bilat Ext: no edema   Assessment: Encounter Diagnoses  Name Primary?   Foot pain, right Yes   Metatarsalgia of right foot    Tobacco use    Essential hypertension    PVD (  peripheral vascular disease) ([place])    Osteoporosis without current pathological fracture, unspecified osteoporosis type    Screening for lung cancer    Right leg paresthesias    Right lumbar radiculopathy    Bunion of right foot    Flat feet      Plan: History of foot pain.  She has flat feet bilaterally, she has metatarsalgia of the right foot and a bunion with overlapping second and first toes.  I advised 2 weeks with CAM Walker boot, ice water bath therapy twice daily, Aleve over-the-counter for 1 week only and resting when possible.  If not much improved within 2 weeks will likely need to refer to podiatry  Bunion-consider referral to podiatry  osteoporosis-she is agreeable to trial of Fosamax.  We reviewed back over her diagnoses and last bone density test.  We discussed options for  therapy.  We discussed treatment options and risk and benefits.  She has dentures.  She will begin trial of Fosamax once weekly.  Discussed proper use of medication.  Hypertension-continue current medication  Tobacco use-begin trial of Wellbutrin including smoking.  Discussed risk and benefits of medication  Chronic low back pain, right leg paresthesias-follow-up with orthopedics/neurosurgery.  I reviewed her consult notes from neurology from February 2022  Alyssa Carter was seen today for med check.  Diagnoses and all orders for this visit:  Foot pain, right  Metatarsalgia of right foot  Tobacco use  Essential hypertension  PVD (peripheral vascular disease) ([place])  Osteoporosis without current pathological fracture, unspecified osteoporosis type  Screening for lung cancer  Right leg paresthesias  Right lumbar radiculopathy  Bunion of right foot  Flat feet  Other orders -     buPROPion (WELLBUTRIN XL) 150 MG 24 hr tablet; Take 1 tablet (150 mg total) by mouth daily. -     alendronate (FOSAMAX) 70 MG tablet; Take 1 tablet (70 mg total) by mouth every 7 (seven) days. Take with a full glass of water on an empty stomach.   Follow up: September for well visit

## 2021-03-28 NOTE — Patient Instructions (Signed)
Foot pain, metatarsalgia Use over there counter Aleve once or twice daily for a week, no ongoing, just short term Use cold water soaks in a bucket twice daily for the next week Begin CAM walker boot x 2 weeks Elevate leg often when possible Avoid excessively walking currently If not much improved in the next 2 weeks, we may need to consider ortho/podiatry consult   [facility] [address] [place], [place] [address] [phone] phone

## 2021-05-10 ENCOUNTER — Other Ambulatory Visit: Payer: Self-pay | Admitting: Medical

## 2021-08-03 ENCOUNTER — Other Ambulatory Visit: Payer: Self-pay | Admitting: Medical

## 2021-09-24 ENCOUNTER — Ambulatory Visit (INDEPENDENT_AMBULATORY_CARE_PROVIDER_SITE_OTHER): Payer: Medicare Other | Admitting: Medical

## 2021-09-24 ENCOUNTER — Other Ambulatory Visit: Payer: Self-pay | Admitting: Medical

## 2021-09-24 ENCOUNTER — Other Ambulatory Visit: Payer: Self-pay

## 2021-09-24 ENCOUNTER — Telehealth: Payer: Self-pay | Admitting: Internal Medicine

## 2021-09-24 VITALS — BP 130/80 | HR 76 | Temp 98.6°F | Wt 167.0 lb

## 2021-09-24 DIAGNOSIS — M79605 Pain in left leg: Secondary | ICD-10-CM | POA: Diagnosis not present

## 2021-09-24 DIAGNOSIS — R29898 Other symptoms and signs involving the musculoskeletal system: Secondary | ICD-10-CM | POA: Diagnosis not present

## 2021-09-24 MED ORDER — HYDROCODONE-ACETAMINOPHEN 5-325 MG PO TABS: 1.0000 | ORAL_TABLET | Freq: Two times a day (BID) | ORAL | 0 refills | Status: DC | PRN

## 2021-09-24 NOTE — Progress Notes (Signed)
Subjective:  Alyssa Carter is a 76 y.o. female who presents for Chief Complaint  Patient presents with   behind the knee pain    Behind the knee pain x 1 month. Tried otc meds but not sure whats going on     Here for pain behind left knee. No recent injury or trauma.   Having pain around a month.   Hurts to stand/walk and put pressure on leg .  No swelling or bruising.   No numbness, tingling or weakness in the leg.   Using ice, rest, ibuprofen.    Nothing helping.   No other aggravating or relieving factors.    No other c/o.  Past Medical History:  Diagnosis Date   Bilateral leg pain    Dyslipidemia    Full dentures    H/O bone density study 8/14   never   H/O mammogram 2010   Hematuria    microscopic, several prior evaluations, no source or cause found   Hyperlipidemia    Hypertension    Impaired fasting blood sugar 2008   Influenza vaccine side effect    intolerance, declines   Tobacco use    Wears glasses    Current Outpatient Medications on File Prior to Visit  Medication Sig Dispense Refill   gabapentin (NEURONTIN) 300 MG capsule Take 2 capsules (600 mg total) by mouth at bedtime. 180 capsule 4   lisinopril-hydrochlorothiazide (ZESTORETIC) 20-12.5 MG tablet TAKE 1 TABLET BY MOUTH  DAILY 90 tablet 1   Multiple Vitamin (MULTIVITAMIN WITH MINERALS) TABS tablet Take 1 tablet by mouth daily.     rosuvastatin (CRESTOR) 40 MG tablet TAKE 1 TABLET BY MOUTH  DAILY 90 tablet 1   No current facility-administered medications on file prior to visit.    Past Surgical History:  Procedure Laterality Date   ABDOMINAL HYSTERECTOMY  1980   total; due to heavy bleeding   APPENDECTOMY  1980   KNEE SURGERY     right   SHOULDER SURGERY     SPINE SURGERY     lumbar   TONSILLECTOMY     TUBAL LIGATION      The following portions of the patient's history were reviewed and updated as appropriate: allergies, current medications, past family history, past medical history, past social  history, past surgical history and problem list.  ROS Otherwise as in subjective above    Objective: BP 130/80    Pulse 76    Temp 98.6 F (37 C)    Wt 167 lb (75.8 kg)    BMI 29.58 kg/m   General appearance: alert, no distress, well developed, well nourished Skin: no bruising or redness of legs MSK: tender in medial portion of left popliteal area with a fullness that is not symmetrical with right popliteal area.  Otherwise legs, tendons, nontender without swelling or deformity of both legs.   Pain noted worse with bearing weight or bending left leg.  Knee ROM, hip ROM , ankle ROM unremarkable.  Otherwise exam unremarkable. Butterly tattoo noted of left lateral distal leg  Pulses: 2+ radial pulses, 2+ pedal pulses, normal cap refill Ext: no edema   Assessment: Encounter Diagnoses  Name Primary?   Popliteal fullness Yes   Leg pain, posterior, left      Plan: Discussed possible differential.   I suspect bakers cyst.  Discussed symptoms that would prompt emergent eval.   Begin recommendations below.   If no improvement in 5-10 days, call back and I'll refer to orthopedics.  Recommendations: Rest and elevate your leg for the next 5 days You can use Aleve over the counter daily for 3-5 days Begin Norco Hydrocodone for worse pain as needed.   Don't use more than every 12 hours.  Caution as this can cause sedation If the pain isn't so bad, just use Tylenol over the counter. Rest your leg on a pillow, and elevated the leg  [name] was seen today for behind the knee pain.  Diagnoses and all orders for this visit:  Popliteal fullness  Leg pain, posterior, left    Follow up: 2 weeks.

## 2021-09-24 NOTE — Telephone Encounter (Signed)
Pt needs pain medication sent to [address] instead of [place]. I have put the new pharmacy in system

## 2021-09-24 NOTE — Patient Instructions (Addendum)
Recommendations: Rest and elevate your leg for the next 5 days You can use Aleve over the counter daily for 3-5 days Begin Norco Hydrocodone for worse pain as needed.   Don't use more than every 12 hours.  Caution as this can cause sedation If the pain isn't so bad, just use Tylenol over the counter. Rest your leg on a pillow, and elevated the leg    Baker Cyst A Baker cyst, also called a popliteal cyst, is a growth that forms at the back of the knee. The cyst forms when the fluid-filled sac (bursa) that cushions the knee joint becomes enlarged. What are the causes? In most cases, a Baker cyst results from another knee problem that causes swelling inside the knee. This makes the fluid inside the knee joint (synovial fluid) flow into the bursa behind the knee, causing the bursa to enlarge. What increases the risk? You may be more likely to develop a Baker cyst if you already have a knee problem, such as: A tear in cartilage that cushions the knee joint (meniscal tear). A tear in the tissues that connect the bones of the knee joint (ligament tear). Knee swelling from osteoarthritis, rheumatoid arthritis, or gout. What are the signs or symptoms? The main symptom of this condition is a lump behind the knee. This may be the only symptom of the condition. The lump may be painful, especially when the knee is straightened. If the lump is painful, the pain may come and go. The knee may also be stiff. Symptoms may quickly get more severe if the cyst breaks open (ruptures). If the cyst ruptures, you may feel the following in your knee and calf: Sudden or worsening pain. Swelling. Bruising. Redness in the calf. A Baker cyst does not always cause symptoms. How is this diagnosed? This condition may be diagnosed based on your symptoms and medical history. Your health care provider will also do a physical exam. This may include: Feeling the cyst to check whether it is tender. Checking your knee for  signs of another knee condition that causes swelling. You may have imaging tests, such as: X-rays. MRI. Ultrasound. How is this treated? A Baker cyst that is not painful may go away without treatment. If the cyst gets large or painful, it will likely get better if the underlying knee problem is treated. If needed, treatment for a Baker cyst may include: Resting. Keeping weight off of the knee. This means not leaning on the knee to support your body weight. Taking NSAIDs, such as ibuprofen, to reduce pain and swelling. Having a procedure to drain the fluid from the cyst with a needle (aspiration). You may also get an injection of a medicine that reduces swelling (steroid). Having surgery. This may be needed if other treatments do not work. This usually involves correcting knee damage and removing the cyst. Follow these instructions at home: Activity Rest as told by your health care provider. Avoid activities that make pain or swelling worse. Return to your normal activities as told by your health care provider. Ask your health care provider what activities are safe for you. Do not use the injured limb to support your body weight until your health care provider says that you can. Use crutches as told by your health care provider. General instructions Take over-the-counter and prescription medicines only as told by your health care provider. Keep all follow-up visits as told by your health care provider. This is important. Contact a health care provider if: You have  knee pain, stiffness, or swelling that does not get better. Get help right away if: You have sudden or worsening pain and swelling in your calf area. Summary A Baker cyst, also called a popliteal cyst, is a growth that forms at the back of the knee. In most cases, a Baker cyst results from another knee problem that causes swelling inside the knee. A Baker cyst that is not painful may go away without treatment. If needed,  treatment for a Baker cyst may include resting, keeping weight off of the knee, medicines, or draining fluid from the cyst. Surgery may be needed if other treatments are not effective. This information is not intended to replace advice given to you by your health care provider. Make sure you discuss any questions you have with your health care provider. Document Revised: 01/08/2019 Document Reviewed: 01/08/2019 Elsevier Patient Education  [employer].

## 2021-09-24 NOTE — Telephone Encounter (Signed)
done

## 2021-09-28 ENCOUNTER — Ambulatory Visit (INDEPENDENT_AMBULATORY_CARE_PROVIDER_SITE_OTHER): Payer: Medicare Other

## 2021-09-28 VITALS — Ht 63.0 in | Wt 168.0 lb

## 2021-09-28 DIAGNOSIS — Z0000 Encounter for general adult medical examination without abnormal findings: Secondary | ICD-10-CM | POA: Diagnosis not present

## 2021-09-28 NOTE — Patient Instructions (Signed)
[name] , Thank you for taking time to come for your Medicare Wellness Visit. I appreciate your ongoing commitment to your health goals. Please review the following plan we discussed and let me know if I can assist you in the future.   Screening recommendations/referrals: Colonoscopy: completed 04/15/2016 Mammogram: patient to schedule Bone Density: completed 08/14/2017 Recommended yearly ophthalmology/optometry visit for glaucoma screening and checkup Recommended yearly dental visit for hygiene and checkup  Vaccinations: Influenza vaccine: completed 06/27/2021 Pneumococcal vaccine: decline Tdap vaccine: decline Shingles vaccine: decline   Covid-19: 05/11/2020, 04/11/2020, 05/03/2021  Advanced directives: copy in chart  Conditions/risks identified: smoking  Next appointment: Follow up in one year for your annual wellness visit    Preventive Care 74 Years and Older, Female Preventive care refers to lifestyle choices and visits with your health care provider that can promote health and wellness. What does preventive care include? A yearly physical exam. This is also called an annual well check. Dental exams once or twice a year. Routine eye exams. Ask your health care provider how often you should have your eyes checked. Personal lifestyle choices, including: Daily care of your teeth and gums. Regular physical activity. Eating a healthy diet. Avoiding tobacco and drug use. Limiting alcohol use. Practicing safe sex. Taking low-dose aspirin every day. Taking vitamin and mineral supplements as recommended by your health care provider. What happens during an annual well check? The services and screenings done by your health care provider during your annual well check will depend on your age, overall health, lifestyle risk factors, and family history of disease. Counseling  Your health care provider may ask you questions about your: Alcohol use. Tobacco use. Drug use. Emotional  well-being. Home and relationship well-being. Sexual activity. Eating habits. History of falls. Memory and ability to understand (cognition). Work and work Statistician. Reproductive health. Screening  You may have the following tests or measurements: Height, weight, and BMI. Blood pressure. Lipid and cholesterol levels. These may be checked every 5 years, or more frequently if you are over 74 years old. Skin check. Lung cancer screening. You may have this screening every year starting at age 74 if you have a 30-pack-year history of smoking and currently smoke or have quit within the past 15 years. Fecal occult blood test (FOBT) of the stool. You may have this test every year starting at age 74. Flexible sigmoidoscopy or colonoscopy. You may have a sigmoidoscopy every 5 years or a colonoscopy every 10 years starting at age 74. Hepatitis C blood test. Hepatitis B blood test. Sexually transmitted disease (STD) testing. Diabetes screening. This is done by checking your blood sugar (glucose) after you have not eaten for a while (fasting). You may have this done every 1-3 years. Bone density scan. This is done to screen for osteoporosis. You may have this done starting at age 74. Mammogram. This may be done every 1-2 years. Talk to your health care provider about how often you should have regular mammograms. Talk with your health care provider about your test results, treatment options, and if necessary, the need for more tests. Vaccines  Your health care provider may recommend certain vaccines, such as: Influenza vaccine. This is recommended every year. Tetanus, diphtheria, and acellular pertussis (Tdap, Td) vaccine. You may need a Td booster every 10 years. Zoster vaccine. You may need this after age 74. Pneumococcal 13-valent conjugate (PCV13) vaccine. One dose is recommended after age 74. Pneumococcal polysaccharide (PPSV23) vaccine. One dose is recommended after age 78. Talk to  your  health care provider about which screenings and vaccines you need and how often you need them. This information is not intended to replace advice given to you by your health care provider. Make sure you discuss any questions you have with your health care provider. Document Released: 09/22/2015 Document Revised: 05/15/2016 Document Reviewed: 06/27/2015 Elsevier Interactive Patient Education  2017 [place] Prevention in the Home Falls can cause injuries. They can happen to people of all ages. There are many things you can do to make your home safe and to help prevent falls. What can I do on the outside of my home? Regularly fix the edges of walkways and driveways and fix any cracks. Remove anything that might make you trip as you walk through a door, such as a raised step or threshold. Trim any bushes or trees on the path to your home. Use bright outdoor lighting. Clear any walking paths of anything that might make someone trip, such as rocks or tools. Regularly check to see if handrails are loose or broken. Make sure that both sides of any steps have handrails. Any raised decks and porches should have guardrails on the edges. Have any leaves, snow, or ice cleared regularly. Use sand or salt on walking paths during winter. Clean up any spills in your garage right away. This includes oil or grease spills. What can I do in the bathroom? Use night lights. Install grab bars by the toilet and in the tub and shower. Do not use towel bars as grab bars. Use non-skid mats or decals in the tub or shower. If you need to sit down in the shower, use a plastic, non-slip stool. Keep the floor dry. Clean up any water that spills on the floor as soon as it happens. Remove soap buildup in the tub or shower regularly. Attach bath mats securely with double-sided non-slip rug tape. Do not have throw rugs and other things on the floor that can make you trip. What can I do in the bedroom? Use night  lights. Make sure that you have a light by your bed that is easy to reach. Do not use any sheets or blankets that are too big for your bed. They should not hang down onto the floor. Have a firm chair that has side arms. You can use this for support while you get dressed. Do not have throw rugs and other things on the floor that can make you trip. What can I do in the kitchen? Clean up any spills right away. Avoid walking on wet floors. Keep items that you use a lot in easy-to-reach places. If you need to reach something above you, use a strong step stool that has a grab bar. Keep electrical cords out of the way. Do not use floor polish or wax that makes floors slippery. If you must use wax, use non-skid floor wax. Do not have throw rugs and other things on the floor that can make you trip. What can I do with my stairs? Do not leave any items on the stairs. Make sure that there are handrails on both sides of the stairs and use them. Fix handrails that are broken or loose. Make sure that handrails are as long as the stairways. Check any carpeting to make sure that it is firmly attached to the stairs. Fix any carpet that is loose or worn. Avoid having throw rugs at the top or bottom of the stairs. If you do have throw rugs, attach  them to the floor with carpet tape. Make sure that you have a light switch at the top of the stairs and the bottom of the stairs. If you do not have them, ask someone to add them for you. What else can I do to help prevent falls? Wear shoes that: Do not have high heels. Have rubber bottoms. Are comfortable and fit you well. Are closed at the toe. Do not wear sandals. If you use a stepladder: Make sure that it is fully opened. Do not climb a closed stepladder. Make sure that both sides of the stepladder are locked into place. Ask someone to hold it for you, if possible. Clearly mark and make sure that you can see: Any grab bars or handrails. First and last  steps. Where the edge of each step is. Use tools that help you move around (mobility aids) if they are needed. These include: Canes. Walkers. Scooters. Crutches. Turn on the lights when you go into a dark area. Replace any light bulbs as soon as they burn out. Set up your furniture so you have a clear path. Avoid moving your furniture around. If any of your floors are uneven, fix them. If there are any pets around you, be aware of where they are. Review your medicines with your doctor. Some medicines can make you feel dizzy. This can increase your chance of falling. Ask your doctor what other things that you can do to help prevent falls. This information is not intended to replace advice given to you by your health care provider. Make sure you discuss any questions you have with your health care provider. Document Released: 06/22/2009 Document Revised: 02/01/2016 Document Reviewed: 09/30/2014 Elsevier Interactive Patient Education  2017 [employer].

## 2021-09-28 NOTE — Progress Notes (Signed)
I connected with Alyssa Carter today by telephone and verified that I am speaking with the correct person using two identifiers. Location patient: home Location provider: work Persons participating in the virtual visit: [place], [place] LPN.   I discussed the limitations, risks, security and privacy concerns of performing an evaluation and management service by telephone and the availability of in person appointments. I also discussed with the patient that there may be a patient responsible charge related to this service. The patient expressed understanding and verbally consented to this telephonic visit.    Interactive audio and video telecommunications were attempted between this provider and patient, however failed, due to patient having technical difficulties OR patient did not have access to video capability.  We continued and completed visit with audio only.     Vital signs may be patient reported or missing.  Subjective:   Alyssa Carter is a 76 y.o. female who presents for Medicare Annual (Subsequent) preventive examination.  Review of Systems     Cardiac Risk Factors include: advanced age (>14men, >64 women);hypertension;dyslipidemia;smoking/ tobacco exposure     Objective:    Today's Vitals   09/28/21 1411  Weight: 168 lb (76.2 kg)  Height: 5\' 3"  (1.6 m)   Body mass index is 29.76 kg/m.  Advanced Directives 09/28/2021 05/25/2020 12/04/2017 04/01/2016 01/17/2016  Does Patient Have a Medical Advance Directive? Yes Yes Yes Yes No  Type of Paramedic of [place];Living will Living will;Healthcare Power of [place] -  Does patient want to make changes to medical advance directive? - - No - Patient declined - -  Copy of [place] in Chart? Yes - validated most recent copy scanned in chart (See row information) - No - copy requested - -  Would patient like  information on creating a medical advance directive? - - - - No - patient declined information    Current Medications (verified) Outpatient Encounter Medications as of 09/28/2021  Medication Sig   gabapentin (NEURONTIN) 300 MG capsule Take 2 capsules (600 mg total) by mouth at bedtime.   HYDROcodone-acetaminophen (NORCO) 5-325 MG tablet Take 1 tablet by mouth 2 (two) times daily as needed.   lisinopril-hydrochlorothiazide (ZESTORETIC) 20-12.5 MG tablet TAKE 1 TABLET BY MOUTH  DAILY   Multiple Vitamin (MULTIVITAMIN WITH MINERALS) TABS tablet Take 1 tablet by mouth daily.   rosuvastatin (CRESTOR) 40 MG tablet TAKE 1 TABLET BY MOUTH  DAILY   No facility-administered encounter medications on file as of 09/28/2021.    Allergies (verified) Aspirin   History: Past Medical History:  Diagnosis Date   Bilateral leg pain    Dyslipidemia    Full dentures    H/O bone density study 8/14   never   H/O mammogram 2010   Hematuria    microscopic, several prior evaluations, no source or cause found   Hyperlipidemia    Hypertension    Impaired fasting blood sugar 2008   Influenza vaccine side effect    intolerance, declines   Tobacco use    Wears glasses    Past Surgical History:  Procedure Laterality Date   ABDOMINAL HYSTERECTOMY  1980   total; due to heavy bleeding   APPENDECTOMY  1980   KNEE SURGERY     right   SHOULDER SURGERY     SPINE SURGERY     lumbar   TONSILLECTOMY     TUBAL LIGATION     Family History  Problem Relation Age of Onset   Alzheimer's disease Mother    Other Mother        died of UTI, dehydration   Other Father 43       failure to thrive, old age, fall and rib fracture   Diabetes Father    Diabetes Sister    Cancer Brother        brain, stomach   Colon cancer Neg Hx    Social History   Socioeconomic History   Marital status: Legally Separated    Spouse name: Not on file   Number of children: 2   Years of education: 12   Highest education level:  High school graduate  Occupational History   Occupation: Receptionist - part time  Tobacco Use   Smoking status: Every Day    Packs/day: 0.50    Years: 40.00    Pack years: 20.00    Types: Cigarettes   Smokeless tobacco: Never  Vaping Use   Vaping Use: Never used  Substance and Sexual Activity   Alcohol use: No   Drug use: No   Sexual activity: Not on file  Other Topics Concern   Not on file  Social History Narrative   Lives alone (son moved out).  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   Right-handed.   No daily caffeine use.    Social Determinants of Health   Financial Resource Strain: Low Risk    Difficulty of Paying Living Expenses: Not hard at all  Food Insecurity: No Food Insecurity   Worried About Charity fundraiser in the Last Year: Never true   [place] in the Last Year: Never true  Transportation Needs: No Transportation Needs   Lack of Transportation (Medical): No   Lack of Transportation (Non-Medical): No  Physical Activity: Inactive   Days of Exercise per Week: 0 days   Minutes of Exercise per Session: 0 min  Stress: No Stress Concern Present   Feeling of Stress : Not at all  Social Connections: Not on file    Tobacco Counseling Ready to quit: Not Answered Counseling given: Not Answered   Clinical Intake:  Pre-visit preparation completed: Yes  Pain : No/denies pain     Nutritional Status: BMI 25 -29 Overweight Nutritional Risks: None Diabetes: No  How often do you need to have someone help you when you read instructions, pamphlets, or other written materials from your doctor or pharmacy?: 1 - Never What is the last grade level you completed in school?: 12th grade  Diabetic? no  Interpreter Needed?: No  Information entered by :: NAllen LPN   Activities of Daily Living In your present state of health, do you have any difficulty performing the following activities: 09/28/2021  Hearing? N  Vision?  N  Difficulty concentrating or making decisions? N  Walking or climbing stairs? Y  Dressing or bathing? N  Doing errands, shopping? N  Preparing Food and eating ? N  Using the Toilet? N  In the past six months, have you accidently leaked urine? N  Do you have problems with loss of bowel control? N  Managing your Medications? N  Managing your Finances? N  Housekeeping or managing your Housekeeping? N  Some recent data might be hidden    Patient Care Team: Tysinger, Camelia Eng, PA-C as PCP - General (Family Medicine)  Indicate any recent Medical Services you may have received from other than [facility] providers in the past year (date  may be approximate).     Assessment:   This is a routine wellness examination for [place].  Hearing/Vision screen Vision Screening - Comments:: Regular eye exams, [facility]  Dietary issues and exercise activities discussed: Current Exercise Habits: The patient does not participate in regular exercise at present   Goals Addressed             This Visit's Progress    Patient Stated       09/28/2021, no goals       Depression Screen PHQ 2/9 Scores 09/28/2021 09/24/2021 03/28/2021 05/25/2020 08/20/2019 02/13/2018 01/08/2017  PHQ - 2 Score 0 0 0 0 0 0 0    Fall Risk Fall Risk  09/28/2021 09/24/2021 03/28/2021 05/25/2020 08/20/2019  Falls in the past year? 0 0 0 0 0  Comment - - - - -  Number falls in past yr: - 0 0 - 0  Injury with Fall? - 0 0 - 0  Risk for fall due to : Medication side effect No Fall Risks No Fall Risks - -  Follow up Falls evaluation completed;Education provided;Falls prevention discussed Falls evaluation completed Falls evaluation completed - -    FALL RISK PREVENTION PERTAINING TO THE HOME:  Any stairs in or around the home? Yes  If so, are there any without handrails? No  Home free of loose throw rugs in walkways, pet beds, electrical cords, etc? Yes  Adequate lighting in your home to reduce risk of falls? Yes   ASSISTIVE  DEVICES UTILIZED TO PREVENT FALLS:  Life alert? No  Use of a cane, walker or w/c? Yes  Grab bars in the bathroom? Yes  Shower chair or bench in shower? No  Elevated toilet seat or a handicapped toilet? No   TIMED UP AND GO:  Was the test performed? No .      Cognitive Function:     6CIT Screen 09/28/2021  What Year? 0 points  What month? 0 points  What time? 0 points  Count back from 20 0 points  Months in reverse 0 points  Repeat phrase 2 points  Total Score 2    Immunizations Immunization History  Administered Date(s) Administered   Fluad Quad(high Dose 65+) 06/30/2019   Influenza Split 07/07/2007, 06/01/2014, 06/05/2015   Influenza, High Dose Seasonal PF 06/27/2021   Influenza-Unspecified 07/31/2016, 06/16/2017, 05/09/2018   Moderna Sars-Covid-2 Vaccination 04/11/2020, 05/11/2020   PPD Test 09/12/2014    TDAP status: Due, Education has been provided regarding the importance of this vaccine. Advised may receive this vaccine at local pharmacy or Health Dept. Aware to provide a copy of the vaccination record if obtained from local pharmacy or Health Dept. Verbalized acceptance and understanding.  Flu Vaccine status: Up to date  Pneumococcal vaccine status: Declined,  Education has been provided regarding the importance of this vaccine but patient still declined. Advised may receive this vaccine at local pharmacy or Health Dept. Aware to provide a copy of the vaccination record if obtained from local pharmacy or Health Dept. Verbalized acceptance and understanding.   Covid-19 vaccine status: Completed vaccines  Qualifies for Shingles Vaccine? Yes   Zostavax completed No   Shingrix Completed?: No.    Education has been provided regarding the importance of this vaccine. Patient has been advised to call insurance company to determine out of pocket expense if they have not yet received this vaccine. Advised may also receive vaccine at local pharmacy or Health Dept.  Verbalized acceptance and understanding.  Screening Tests Health  Maintenance  Topic Date Due   COLONOSCOPY (Pts 45-27yrs Insurance coverage will need to be confirmed)  04/16/2019   COVID-19 Vaccine (3 - Booster for Moderna series) 10/14/2021 (Originally 07/06/2020)   Zoster Vaccines- Shingrix (1 of 2) 12/27/2021 (Originally 04/26/1998)   Pneumonia Vaccine 86+ Years old (1 - PCV) 09/28/2022 (Originally 04/26/1952)   TETANUS/TDAP  09/28/2022 (Originally 04/27/1967)   MAMMOGRAM  07/10/2022   INFLUENZA VACCINE  Completed   DEXA SCAN  Completed   Hepatitis C Screening  Completed   HPV VACCINES  Aged Out    Health Maintenance  Health Maintenance Due  Topic Date Due   COLONOSCOPY (Pts 45-62yrs Insurance coverage will need to be confirmed)  04/16/2019    Colorectal cancer screening: Type of screening: Colonoscopy. Completed 04/15/2016. Repeat every 10 years  Mammogram status: patient to schedule  Bone Density status: Completed 08/14/2017.   Lung Cancer Screening: (Low Dose CT Chest recommended if Age 72-80 years, 30 pack-year currently smoking OR have quit w/in 15years.) does qualify.   Lung Cancer Screening Referral: decline  Additional Screening:  Hepatitis C Screening: does qualify; Completed 02/13/2018  Vision Screening: Recommended annual ophthalmology exams for early detection of glaucoma and other disorders of the eye. Is the patient up to date with their annual eye exam?  Yes  Who is the provider or what is the name of the office in which the patient attends annual eye exams? [facility] If pt is not established with a provider, would they like to be referred to a provider to establish care? No .   Dental Screening: Recommended annual dental exams for proper oral hygiene  Community Resource Referral / Chronic Care Management: CRR required this visit?  No   CCM required this visit?  No      Plan:     I have personally reviewed and noted the following in the patients  chart:   Medical and social history Use of alcohol, tobacco or illicit drugs  Current medications and supplements including opioid prescriptions.  Functional ability and status Nutritional status Physical activity Advanced directives List of other physicians Hospitalizations, surgeries, and ER visits in previous 12 months Vitals Screenings to include cognitive, depression, and falls Referrals and appointments  In addition, I have reviewed and discussed with patient certain preventive protocols, quality metrics, and best practice recommendations. A written personalized care plan for preventive services as well as general preventive health recommendations were provided to patient.     Kellie Simmering, LPN   [id number]   Nurse Notes: none

## 2021-10-01 ENCOUNTER — Other Ambulatory Visit: Payer: Self-pay | Admitting: Medical

## 2021-10-01 ENCOUNTER — Telehealth: Payer: Self-pay | Admitting: Medical

## 2021-10-01 DIAGNOSIS — R202 Paresthesia of skin: Secondary | ICD-10-CM

## 2021-10-01 DIAGNOSIS — M79604 Pain in right leg: Secondary | ICD-10-CM

## 2021-10-01 DIAGNOSIS — M810 Age-related osteoporosis without current pathological fracture: Secondary | ICD-10-CM

## 2021-10-01 DIAGNOSIS — R252 Cramp and spasm: Secondary | ICD-10-CM

## 2021-10-01 NOTE — Telephone Encounter (Signed)
Ok with putting referral order in?

## 2021-10-01 NOTE — Telephone Encounter (Signed)
Pt called and said she was told if she did not feel better by today then she could get a referral for Ortho. Pt said she would like to be sent to Dr.Whitfield on [address]. If possible

## 2021-10-04 ENCOUNTER — Ambulatory Visit: Payer: Medicare Other | Admitting: Orthopaedic Surgery

## 2021-10-04 ENCOUNTER — Encounter: Payer: Self-pay | Admitting: Orthopaedic Surgery

## 2021-10-04 ENCOUNTER — Other Ambulatory Visit: Payer: Self-pay

## 2021-10-04 ENCOUNTER — Ambulatory Visit: Payer: Self-pay

## 2021-10-04 VITALS — Ht 63.0 in | Wt 168.0 lb

## 2021-10-04 DIAGNOSIS — M25562 Pain in left knee: Secondary | ICD-10-CM

## 2021-10-04 DIAGNOSIS — G8929 Other chronic pain: Secondary | ICD-10-CM | POA: Diagnosis not present

## 2021-10-04 MED ORDER — METHYLPREDNISOLONE ACETATE 40 MG/ML IJ SUSP
80.0000 mg | INTRAMUSCULAR | Status: AC | PRN
  Administered 2021-10-04: 80 mg via INTRA_ARTICULAR

## 2021-10-04 MED ORDER — LIDOCAINE HCL 1 % IJ SOLN
5.0000 mL | INTRAMUSCULAR | Status: AC | PRN
  Administered 2021-10-04: 5 mL

## 2021-10-04 NOTE — Progress Notes (Signed)
Office Visit Note   Patient: Alyssa Carter           Date of Birth: 22-Dec-1945           MRN: [id number] Visit Date: 10/04/2021              Requested by: Carlena Hurl, PA-C [address] [place],  [place] [address] PCP: Carlena Hurl, PA-C   Assessment & Plan: Visit Diagnoses:  1. Chronic pain of left knee     Plan: Pleasant 76 year old woman with a 1 month complaint of posterior knee pain.  Denies any calf pain denies any injuries.  She denies really any knee pain.  Findings consistent with perhaps a very small popliteal cyst.  This really does bother her especially with extension and weightbearing.  We did attempt an aspiration but got a cc of clear aspirate.  That we decided to go forward and give her a steroid injection anteriorly hopefully this will relieve some of her symptoms she will follow-up in 3 weeks  Follow-Up Instructions: No follow-ups on file.   Orders:  Orders Placed This Encounter  Procedures   XR KNEE 3 VIEW LEFT   No orders of the defined types were placed in this encounter.     Procedures: Large Joint Inj: L knee on 10/04/2021 2:44 PM Indications: pain and diagnostic evaluation Details: 25 G 1.5 in needle, anteromedial approach  Arthrogram: No  Medications: 80 mg methylPREDNISolone acetate 40 MG/ML; 5 mL lidocaine 1 % Aspirate: clear Outcome: tolerated well, no immediate complications  After obtaining verbal consent the area was sterilely prepped in the popliteal fossa with alcohol and Betadine.  It was locally anesthetized with 3 cc of lidocaine plain.  Attempted aspirate was done with 1 cc of blood-tinged clear fluid.  Band-Aid was placed.  Following this she was injected with 80 cc Depo-Medrol with 2 cc of bupivacaine and 2 cc of lidocaine.  She tolerated this well Procedure, treatment alternatives, risks and benefits explained, specific risks discussed. Consent was given by the patient.      Clinical Data: No additional  findings.   Subjective: Chief Complaint  Patient presents with   Left Knee - Pain  Patient presents today for left knee pain. She said that it has been hurting for two months. All of her pain is posteriorly, and only occurs with weightbearing. She said that she saw her PCP and was told she had a Bakers Cyst. She has been icing, elevating, and taking pain medicine. No previous knee surgery.     Review of Systems  All other systems reviewed and are negative.   Objective: Vital Signs: There were no vitals taken for this visit.  Physical Exam Constitutional:      Appearance: Normal appearance.  Pulmonary:     Effort: Pulmonary effort is normal.  Neurological:     General: No focal deficit present.     Mental Status: She is alert.    Ortho Exam Emanation of the left knee she has no effusion no erythema.  Her calves are soft and nontender negative Homans' sign.  She has some focal tenderness in the popliteal fossa but no surrounding erythema no varicosities.  Does not radiate into her leg or thigh.  May be slightly fuller than the unaffected side in the popliteal fossa no tenderness in anterior knee Specialty Comments:  No specialty comments available.  Imaging: No results found.   PMFS History: Patient Active Problem List   Diagnosis Date Noted  Bunion of right foot 03/28/2021   Flat feet 03/28/2021   Bunion 03/28/2021   Right lumbar radiculopathy 10/19/2020   Right cervical radiculopathy 10/19/2020   Muscle cramp 08/30/2020   Right leg paresthesias 08/08/2020   Elevated serum creatinine 07/07/2020   Phlebitis 07/07/2020   Right leg pain 07/07/2020   Screening for lung cancer 07/07/2020   Smoker 07/07/2020   Medicare annual wellness visit, subsequent 05/25/2020   Encounter for screening mammogram for malignant neoplasm of breast 05/25/2020   Neuropathy 05/25/2020   Vaccine counseling 05/25/2020   Encounter for screening for lung cancer 05/25/2020   Leg cramping  05/25/2020   Varicose veins of both lower extremities 05/25/2020   Spider veins 05/25/2020   Ringing in ear, bilateral 05/25/2020   Impaired fasting blood sugar 08/20/2019   Vitamin D deficiency 08/20/2019   S/P hysterectomy 08/20/2019   Osteoporosis without current pathological fracture 08/27/2017   Encounter for health maintenance examination in adult 01/08/2017   Estrogen deficiency 01/08/2017   PVD (peripheral vascular disease) ([place]) 01/08/2017   Chronic right shoulder pain 01/08/2017   Paresthesia of both lower extremities 05/22/2015   Essential hypertension 05/22/2015   Mixed dyslipidemia 05/22/2015   History of back surgery 05/22/2015   Advance directive discussed with patient 05/22/2015   Tobacco use disorder 03/30/2014   Past Medical History:  Diagnosis Date   Bilateral leg pain    Dyslipidemia    Full dentures    H/O bone density study 8/14   never   H/O mammogram 2010   Hematuria    microscopic, several prior evaluations, no source or cause found   Hyperlipidemia    Hypertension    Impaired fasting blood sugar 2008   Influenza vaccine side effect    intolerance, declines   Tobacco use    Wears glasses     Family History  Problem Relation Age of Onset   Alzheimer's disease Mother    Other Mother        died of UTI, dehydration   Other Father 82       failure to thrive, old age, fall and rib fracture   Diabetes Father    Diabetes Sister    Cancer Brother        brain, stomach   Colon cancer Neg Hx     Past Surgical History:  Procedure Laterality Date   ABDOMINAL HYSTERECTOMY  1980   total; due to heavy bleeding   APPENDECTOMY  1980   KNEE SURGERY     right   SHOULDER SURGERY     SPINE SURGERY     lumbar   TONSILLECTOMY     TUBAL LIGATION     Social History   Occupational History   Occupation: Receptionist - part time  Tobacco Use   Smoking status: Every Day    Packs/day: 0.50    Years: 40.00    Pack years: 20.00    Types: Cigarettes    Smokeless tobacco: Never  Vaping Use   Vaping Use: Never used  Substance and Sexual Activity   Alcohol use: No   Drug use: No   Sexual activity: Not on file

## 2021-10-15 ENCOUNTER — Other Ambulatory Visit: Payer: Self-pay

## 2021-10-15 MED ORDER — ROSUVASTATIN CALCIUM 40 MG PO TABS: 40.0000 mg | ORAL_TABLET | Freq: Every day | ORAL | 1 refills | Status: DC

## 2021-10-15 MED ORDER — LISINOPRIL-HYDROCHLOROTHIAZIDE 20-12.5 MG PO TABS: 1.0000 | ORAL_TABLET | Freq: Every day | ORAL | 1 refills | Status: DC

## 2021-10-17 ENCOUNTER — Other Ambulatory Visit: Payer: Self-pay | Admitting: Medical

## 2021-10-17 ENCOUNTER — Telehealth: Payer: Self-pay | Admitting: Internal Medicine

## 2021-10-17 MED ORDER — GABAPENTIN 300 MG PO CAPS: 600.0000 mg | ORAL_CAPSULE | Freq: Every day | ORAL | 4 refills | Status: DC

## 2021-10-17 NOTE — Telephone Encounter (Signed)
Pt needs a refill on her gabapentin 300mg  to [facility]

## 2021-10-18 ENCOUNTER — Encounter: Payer: Self-pay | Admitting: Physician Assistant

## 2021-10-18 ENCOUNTER — Other Ambulatory Visit: Payer: Self-pay

## 2021-10-18 ENCOUNTER — Ambulatory Visit: Payer: Medicare PPO | Admitting: Physician Assistant

## 2021-10-18 DIAGNOSIS — M25562 Pain in left knee: Secondary | ICD-10-CM | POA: Diagnosis not present

## 2021-10-18 DIAGNOSIS — G8929 Other chronic pain: Secondary | ICD-10-CM

## 2021-10-18 NOTE — Progress Notes (Signed)
Office Visit Note   Patient: Alyssa Carter           Date of Birth: February 10, 1946           MRN: [id number] Visit Date: 10/18/2021              Requested by: Carlena Hurl, PA-C [address] [place],  [place] [address] PCP: Carlena Hurl, PA-C  Chief Complaint  Patient presents with   Left Knee - Pain, Follow-up      HPI: Patient is a pleasant 76 year old woman who follows up for her left posterior knee pain.  She has had a 58-month history of left posterior knee pain that has been significant enough that she has difficulty with pain with straightening her leg.  She denies any other pain in her knee she denies any anterior pain medial lateral compartment pain.  When she was here last Dr. Durward Fortes did attempt to aspirate the Baker's cyst which seem to be the source of her pain.  He injected it with a combination of local anesthetic and cortisone.  She said she got absolutely no relief at all including diagnostic relief.  This is making it difficult for her to walk long distances.  Assessment & Plan: Visit Diagnoses:  1. Chronic pain of left knee     Plan: Chronic pain of the left knee she has some early arthritic symptoms but this does not seem to be the source of her pain.  Does have increased pain with stretching right in the same place in the posterior fossa of the knee no pain in the calf no tenderness in the calf negative Homans' sign I am recommending an MRI with follow-up with Dr. Durward Fortes.  Follow-Up Instructions: No follow-ups on file.   Ortho Exam  Patient is alert, oriented, no adenopathy, well-dressed, normal affect, normal respiratory effort. Left knee: She has no effusion no redness no cellulitis she has focal tenderness in the popliteal fossa area of the previous injection she does not have pain with flexion she does have increased symptoms with terminal extension no instability noted  Imaging: No results found. No images are attached to the  encounter.  Labs: Lab Results  Component Value Date   HGBA1C 5.9 (H) 05/25/2020   HGBA1C 5.8 (H) 08/20/2019   HGBA1C 5.8 (H) 02/13/2018   ESRSEDRATE 4 05/25/2020     Lab Results  Component Value Date   ALBUMIN 5.0 (H) 07/07/2020   ALBUMIN 4.8 (H) 05/25/2020   ALBUMIN 4.6 08/20/2019    Lab Results  Component Value Date   MG 2.0 08/30/2020   Lab Results  Component Value Date   VD25OH 32.1 08/30/2020   VD25OH 31.2 02/13/2018    No results found for: PREALBUMIN CBC EXTENDED Latest Ref Rng & Units 05/25/2020 08/20/2019 02/13/2018  WBC 3.4 - 10.8 x10E3/uL 7.7 6.3 6.1  RBC 3.77 - 5.28 x10E6/uL 4.15 4.27 4.24  HGB 11.1 - 15.9 g/dL 13.5 13.7 13.4  HCT 34.0 - 46.6 % 39.0 39.4 39.9  PLT 150 - 450 x10E3/uL 278 278 285  NEUTROABS 1.4 - 7.0 x10E3/uL 4.9 3.3 -  LYMPHSABS 0.7 - 3.1 x10E3/uL 2.1 2.5 -     There is no height or weight on file to calculate BMI.  Orders:  Orders Placed This Encounter  Procedures   MR Knee Left w/o contrast   No orders of the defined types were placed in this encounter.    Procedures: No procedures performed  Clinical Data: No additional  findings.  ROS:  All other systems negative, except as noted in the HPI. Review of Systems  Objective: Vital Signs: There were no vitals taken for this visit.  Specialty Comments:  No specialty comments available.  PMFS History: Patient Active Problem List   Diagnosis Date Noted   Bunion of right foot 03/28/2021   Flat feet 03/28/2021   Bunion 03/28/2021   Right lumbar radiculopathy 10/19/2020   Right cervical radiculopathy 10/19/2020   Muscle cramp 08/30/2020   Right leg paresthesias 08/08/2020   Elevated serum creatinine 07/07/2020   Phlebitis 07/07/2020   Right leg pain 07/07/2020   Screening for lung cancer 07/07/2020   Smoker 07/07/2020   Medicare annual wellness visit, subsequent 05/25/2020   Encounter for screening mammogram for malignant neoplasm of breast 05/25/2020   Neuropathy  05/25/2020   Vaccine counseling 05/25/2020   Encounter for screening for lung cancer 05/25/2020   Leg cramping 05/25/2020   Varicose veins of both lower extremities 05/25/2020   Spider veins 05/25/2020   Ringing in ear, bilateral 05/25/2020   Impaired fasting blood sugar 08/20/2019   Vitamin D deficiency 08/20/2019   S/P hysterectomy 08/20/2019   Osteoporosis without current pathological fracture 08/27/2017   Encounter for health maintenance examination in adult 01/08/2017   Estrogen deficiency 01/08/2017   PVD (peripheral vascular disease) ([place]) 01/08/2017   Chronic right shoulder pain 01/08/2017   Paresthesia of both lower extremities 05/22/2015   Essential hypertension 05/22/2015   Mixed dyslipidemia 05/22/2015   History of back surgery 05/22/2015   Advance directive discussed with patient 05/22/2015   Tobacco use disorder 03/30/2014   Past Medical History:  Diagnosis Date   Bilateral leg pain    Dyslipidemia    Full dentures    H/O bone density study 8/14   never   H/O mammogram 2010   Hematuria    microscopic, several prior evaluations, no source or cause found   Hyperlipidemia    Hypertension    Impaired fasting blood sugar 2008   Influenza vaccine side effect    intolerance, declines   Tobacco use    Wears glasses     Family History  Problem Relation Age of Onset   Alzheimer's disease Mother    Other Mother        died of UTI, dehydration   Other Father 21       failure to thrive, old age, fall and rib fracture   Diabetes Father    Diabetes Sister    Cancer Brother        brain, stomach   Colon cancer Neg Hx     Past Surgical History:  Procedure Laterality Date   ABDOMINAL HYSTERECTOMY  1980   total; due to heavy bleeding   APPENDECTOMY  1980   KNEE SURGERY     right   SHOULDER SURGERY     SPINE SURGERY     lumbar   TONSILLECTOMY     TUBAL LIGATION     Social History   Occupational History   Occupation: Receptionist - part time  Tobacco Use    Smoking status: Every Day    Packs/day: 0.50    Years: 40.00    Pack years: 20.00    Types: Cigarettes   Smokeless tobacco: Never  Vaping Use   Vaping Use: Never used  Substance and Sexual Activity   Alcohol use: No   Drug use: No   Sexual activity: Not on file

## 2021-10-19 ENCOUNTER — Telehealth: Payer: Self-pay | Admitting: Medical

## 2021-10-19 ENCOUNTER — Other Ambulatory Visit: Payer: Self-pay | Admitting: Medical

## 2021-10-19 MED ORDER — GABAPENTIN 300 MG PO CAPS: 600.0000 mg | ORAL_CAPSULE | Freq: Every day | ORAL | 4 refills | Status: DC

## 2021-10-19 NOTE — Telephone Encounter (Signed)
Alyssa Carter sent refill request for gabapentin 300 please send to the [facility]

## 2021-11-04 ENCOUNTER — Ambulatory Visit
Admission: RE | Admit: 2021-11-04 | Discharge: 2021-11-04 | Disposition: A | Discharge status: Home / Self Care | Payer: Medicare PPO | Source: Ambulatory Visit | Attending: Physician Assistant | Admitting: Physician Assistant

## 2021-11-04 DIAGNOSIS — G8929 Other chronic pain: Secondary | ICD-10-CM

## 2021-11-08 ENCOUNTER — Other Ambulatory Visit: Payer: Self-pay

## 2021-11-08 ENCOUNTER — Ambulatory Visit: Payer: Medicare PPO | Admitting: Orthopaedic Surgery

## 2021-11-08 ENCOUNTER — Encounter: Payer: Self-pay | Admitting: Orthopaedic Surgery

## 2021-11-08 DIAGNOSIS — M545 Low back pain, unspecified: Secondary | ICD-10-CM | POA: Diagnosis not present

## 2021-11-08 DIAGNOSIS — G8929 Other chronic pain: Secondary | ICD-10-CM | POA: Diagnosis not present

## 2021-11-08 DIAGNOSIS — M25562 Pain in left knee: Secondary | ICD-10-CM

## 2021-11-08 NOTE — Progress Notes (Signed)
? ?Office Visit Note ?  ?Patient: Alyssa Carter           ?Date of Birth: Jun 28, 1946           ?MRN: [id number] ?Visit Date: 11/08/2021 ?             ?Requested by: Carlena Hurl, PA-C ?[address] ?[place],  [place] [address] ?PCP: Carlena Hurl, PA-C ? ? ?Assessment & Plan: ?Visit Diagnoses: Burning posterior knee history of lower back pain ? ?Plan: [name] has had a 66-month history of burning in the back of her knee.  She denies any injuries.  She did have attempted aspiration of the Baker's cyst which was quite small and has had a knee injection and has not had any relief.  We did order an MRI of her knee and is here to review this today.  MRI of her of her knee demonstrated a very small tearing of the medial meniscus.  She does have some mild arthritis.  MRI was very benign.  Upon questioning she does have a history of lower back issues and had an MRI before of her lumbar spine.  A lot of her symptoms including increased pain with standing and walking for a while seem consistent with stenosis.  Her MRI a year ago demonstrated stenosis at 3 levels L2-3 L3-4 and L4-5.  Certainly this could be coming from her spine.  We would recommend an epidural steroid injection both as a diagnostic and therapeutic option.  She will go forward with this ? ?Follow-Up Instructions: No follow-ups on file.  ? ?Orders:  ?No orders of the defined types were placed in this encounter. ? ?No orders of the defined types were placed in this encounter. ? ? ? ? Procedures: ?No procedures performed ? ? ?Clinical Data: ?No additional findings. ? ? ?Subjective: ?No chief complaint on file. ?Patient presents today for follow up on her left knee. She had an MRI and is here today for those results. ? ? ? ?Review of Systems  ?All other systems reviewed and are negative. ? ? ?Objective: ?Vital Signs: There were no vitals taken for this visit. ? ?Physical Exam ?Constitutional:   ?   Appearance: Normal appearance.  ?Pulmonary:  ?    Effort: Pulmonary effort is normal.  ?Skin: ?   General: Skin is warm and dry.  ?Neurological:  ?   Mental Status: She is alert.  ? ? ?Ortho Exam ?Examination of her knee she has no effusion no redness she has reproducible burning in the back of her knee no instability.  No redness ?Specialty Comments:  ?No specialty comments available. ? ?Imaging: ?No results found. ? ? ?PMFS History: ?Patient Active Problem List  ? Diagnosis Date Noted  ? Bunion of right foot 03/28/2021  ? Flat feet 03/28/2021  ? Bunion 03/28/2021  ? Right lumbar radiculopathy 10/19/2020  ? Right cervical radiculopathy 10/19/2020  ? Muscle cramp 08/30/2020  ? Right leg paresthesias 08/08/2020  ? Elevated serum creatinine 07/07/2020  ? Phlebitis 07/07/2020  ? Right leg pain 07/07/2020  ? Screening for lung cancer 07/07/2020  ? Smoker 07/07/2020  ? Medicare annual wellness visit, subsequent 05/25/2020  ? Encounter for screening mammogram for malignant neoplasm of breast 05/25/2020  ? Neuropathy 05/25/2020  ? Vaccine counseling 05/25/2020  ? Encounter for screening for lung cancer 05/25/2020  ? Leg cramping 05/25/2020  ? Varicose veins of both lower extremities 05/25/2020  ? Spider veins 05/25/2020  ? Ringing in ear, bilateral 05/25/2020  ?  Impaired fasting blood sugar 08/20/2019  ? Vitamin D deficiency 08/20/2019  ? S/P hysterectomy 08/20/2019  ? Osteoporosis without current pathological fracture 08/27/2017  ? Encounter for health maintenance examination in adult 01/08/2017  ? Estrogen deficiency 01/08/2017  ? PVD (peripheral vascular disease) ([place]) 01/08/2017  ? Chronic right shoulder pain 01/08/2017  ? Paresthesia of both lower extremities 05/22/2015  ? Essential hypertension 05/22/2015  ? Mixed dyslipidemia 05/22/2015  ? History of back surgery 05/22/2015  ? Advance directive discussed with patient 05/22/2015  ? Tobacco use disorder 03/30/2014  ? ?Past Medical History:  ?Diagnosis Date  ? Bilateral leg pain   ? Dyslipidemia   ? Full dentures   ?  H/O bone density study 8/14  ? never  ? H/O mammogram 2010  ? Hematuria   ? microscopic, several prior evaluations, no source or cause found  ? Hyperlipidemia   ? Hypertension   ? Impaired fasting blood sugar 2008  ? Influenza vaccine side effect   ? intolerance, declines  ? Tobacco use   ? Wears glasses   ?  ?Family History  ?Problem Relation Age of Onset  ? Alzheimer's disease Mother   ? Other Mother   ?     died of UTI, dehydration  ? Other Father 5  ?     failure to thrive, old age, fall and rib fracture  ? Diabetes Father   ? Diabetes Sister   ? Cancer Brother   ?     brain, stomach  ? Colon cancer Neg Hx   ?  ?Past Surgical History:  ?Procedure Laterality Date  ? ABDOMINAL HYSTERECTOMY  1980  ? total; due to heavy bleeding  ? APPENDECTOMY  1980  ? KNEE SURGERY    ? right  ? SHOULDER SURGERY    ? SPINE SURGERY    ? lumbar  ? TONSILLECTOMY    ? TUBAL LIGATION    ? ?Social History  ? ?Occupational History  ? Occupation: Receptionist - part time  ?Tobacco Use  ? Smoking status: Every Day  ?  Packs/day: 0.50  ?  Years: 40.00  ?  Pack years: 20.00  ?  Types: Cigarettes  ? Smokeless tobacco: Never  ?Vaping Use  ? Vaping Use: Never used  ?Substance and Sexual Activity  ? Alcohol use: No  ? Drug use: No  ? Sexual activity: Not on file  ? ? ? ? ? ? ?

## 2021-11-09 ENCOUNTER — Telehealth: Payer: Self-pay | Admitting: *Deleted

## 2021-11-09 NOTE — Telephone Encounter (Signed)
Scheduled on 11/12/21 at 130p at [facility] ?

## 2021-11-09 NOTE — Telephone Encounter (Signed)
-----   Message from Lendon Collar, RT sent at 11/08/2021  1:58 PM EST ----- ?ESI of L-Spine ordered for [facility].  ? ?

## 2021-11-12 ENCOUNTER — Ambulatory Visit
Admission: RE | Admit: 2021-11-12 | Discharge: 2021-11-12 | Disposition: A | Discharge status: Home / Self Care | Payer: Medicare PPO | Source: Ambulatory Visit | Attending: Orthopaedic Surgery | Admitting: Orthopaedic Surgery

## 2021-11-12 ENCOUNTER — Other Ambulatory Visit: Payer: Self-pay

## 2021-11-12 DIAGNOSIS — M545 Low back pain, unspecified: Secondary | ICD-10-CM

## 2021-11-12 DIAGNOSIS — G8929 Other chronic pain: Secondary | ICD-10-CM

## 2021-11-12 MED ORDER — METHYLPREDNISOLONE ACETATE 40 MG/ML INJ SUSP (RADIOLOG
80.0000 mg | Freq: Once | INTRAMUSCULAR | Status: AC
  Administered 2021-11-12: 80 mg via EPIDURAL

## 2021-11-12 MED ORDER — IOPAMIDOL (ISOVUE-M 200) INJECTION 41%
1.0000 mL | Freq: Once | INTRAMUSCULAR | Status: AC
  Administered 2021-11-12: 1 mL via EPIDURAL

## 2021-11-12 NOTE — Discharge Instructions (Signed)

## 2022-03-07 ENCOUNTER — Ambulatory Visit (INDEPENDENT_AMBULATORY_CARE_PROVIDER_SITE_OTHER): Payer: Medicare PPO | Admitting: Medical

## 2022-03-07 VITALS — BP 130/80 | HR 79 | Ht 63.0 in | Wt 170.3 lb

## 2022-03-07 DIAGNOSIS — Z7189 Other specified counseling: Secondary | ICD-10-CM

## 2022-03-07 DIAGNOSIS — Z0000 Encounter for general adult medical examination without abnormal findings: Secondary | ICD-10-CM

## 2022-03-07 DIAGNOSIS — F17200 Nicotine dependence, unspecified, uncomplicated: Secondary | ICD-10-CM

## 2022-03-07 DIAGNOSIS — I8393 Asymptomatic varicose veins of bilateral lower extremities: Secondary | ICD-10-CM

## 2022-03-07 DIAGNOSIS — I781 Nevus, non-neoplastic: Secondary | ICD-10-CM

## 2022-03-07 DIAGNOSIS — E782 Mixed hyperlipidemia: Secondary | ICD-10-CM

## 2022-03-07 DIAGNOSIS — Z122 Encounter for screening for malignant neoplasm of respiratory organs: Secondary | ICD-10-CM

## 2022-03-07 DIAGNOSIS — E559 Vitamin D deficiency, unspecified: Secondary | ICD-10-CM

## 2022-03-07 DIAGNOSIS — Z90710 Acquired absence of both cervix and uterus: Secondary | ICD-10-CM

## 2022-03-07 DIAGNOSIS — M810 Age-related osteoporosis without current pathological fracture: Secondary | ICD-10-CM

## 2022-03-07 DIAGNOSIS — R7301 Impaired fasting glucose: Secondary | ICD-10-CM

## 2022-03-07 DIAGNOSIS — E2839 Other primary ovarian failure: Secondary | ICD-10-CM

## 2022-03-07 DIAGNOSIS — G629 Polyneuropathy, unspecified: Secondary | ICD-10-CM

## 2022-03-07 DIAGNOSIS — Z7185 Encounter for immunization safety counseling: Secondary | ICD-10-CM

## 2022-03-07 DIAGNOSIS — I10 Essential (primary) hypertension: Secondary | ICD-10-CM

## 2022-03-07 DIAGNOSIS — I739 Peripheral vascular disease, unspecified: Secondary | ICD-10-CM

## 2022-03-07 NOTE — Progress Notes (Signed)
Subjective:    Alyssa Carter is a 76 y.o. female who presents for Preventative Services visit and chronic medical problems/med check visit.    Primary Care Provider Tysinger, Camelia Eng, PA-C here for primary care  Current Health Care Team: Dentist, - dentures Eye doctor, - house of eyes- [address] Dr. Joni Fears, [facility] Persons, PA, orthopedics Dr. Marcial Pacas, neurology   Medical Services you may have received from other than [facility] providers in the past year (date may be approximate) none  Exercise Current exercise habits:  walking     Nutrition/Diet Current diet: in general, a "healthy" diet    Depression Screen    03/07/2022   10:10 AM  Depression screen PHQ 2/9  Decreased Interest 0  Down, Depressed, Hopeless 0  PHQ - 2 Score 0    Activities of Daily Living Screen/Functional Status Survey    Can patient draw a clock face showing 3:15 oclock, - did AWV already  [place]    03/07/2022   10:10 AM 09/28/2021    2:19 PM 09/24/2021    2:12 PM 03/28/2021    9:13 AM 05/25/2020    9:35 AM  [place] in the past year? 0 0 0 0 0  Number falls in past yr: 0  0 0   Injury with Fall? 0  0 0   Risk for fall due to : No Fall Risks Medication side effect No Fall Risks No Fall Risks   Follow up Falls evaluation completed Falls evaluation completed;Education provided;Falls prevention discussed Falls evaluation completed Falls evaluation completed     Gait Assessment: Normal gait observed yes  Advanced directives Does patient have a [place]? Yes Does patient have a Living Will? Yes  Past Medical History:  Diagnosis Date   Bilateral leg pain    Dyslipidemia    Full dentures    H/O bone density study 8/14   never   H/O mammogram 2010   Hematuria    microscopic, several prior evaluations, no source or cause found   Hyperlipidemia    Hypertension    Impaired fasting blood sugar 2008   Influenza vaccine side effect     intolerance, declines   Tobacco use    Wears glasses     Past Surgical History:  Procedure Laterality Date   ABDOMINAL HYSTERECTOMY  1980   total; due to heavy bleeding   APPENDECTOMY  1980   KNEE SURGERY     right   SHOULDER SURGERY     SPINE SURGERY     lumbar   TONSILLECTOMY     TUBAL LIGATION      Social History   Socioeconomic History   Marital status: Legally Separated    Spouse name: Not on file   Number of children: 2   Years of education: 12   Highest education level: High school graduate  Occupational History   Occupation: Receptionist - part time  Tobacco Use   Smoking status: Every Day    Packs/day: 0.50    Years: 40.00    Total pack years: 20.00    Types: Cigarettes   Smokeless tobacco: Never  Vaping Use   Vaping Use: Never used  Substance and Sexual Activity   Alcohol use: No   Drug use: No   Sexual activity: Not on file  Other Topics Concern   Not on file  Social History Narrative   Lives alone (son moved out).  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   Right-handed.   No daily caffeine use.    Social Determinants of Health   Financial Resource Strain: Low Risk  (09/28/2021)   Overall Financial Resource Strain (CARDIA)    Difficulty of Paying Living Expenses: Not hard at all  Food Insecurity: No Food Insecurity (09/28/2021)   Hunger Vital Sign    Worried About Running Out of Food in the Last Year: Never true    Ran Out of Food in the Last Year: Never true  Transportation Needs: No Transportation Needs (09/28/2021)   PRAPARE - Hydrologist (Medical): No    Lack of Transportation (Non-Medical): No  Physical Activity: Inactive (09/28/2021)   Exercise Vital Sign    Days of Exercise per Week: 0 days    Minutes of Exercise per Session: 0 min  Stress: No Stress Concern Present (09/28/2021)   [place]    Feeling of  Stress : Not at all  Social Connections: Not on file  Intimate Partner Violence: Not on file    Family History  Problem Relation Age of Onset   Alzheimer's disease Mother    Other Mother        died of UTI, dehydration   Other Father 33       failure to thrive, old age, fall and rib fracture   Diabetes Father    Diabetes Sister    Cancer Brother        brain, stomach   Colon cancer Neg Hx      Current Outpatient Medications:    gabapentin (NEURONTIN) 300 MG capsule, Take 2 capsules (600 mg total) by mouth at bedtime., Disp: 180 capsule, Rfl: 4   lisinopril-hydrochlorothiazide (ZESTORETIC) 20-12.5 MG tablet, Take 1 tablet by mouth daily., Disp: 90 tablet, Rfl: 1   Multiple Vitamin (MULTIVITAMIN WITH MINERALS) TABS tablet, Take 1 tablet by mouth daily., Disp: , Rfl:    Omega-3 Fatty Acids (FISH OIL ADULT GUMMIES PO), Take 1 tablet by mouth daily., Disp: , Rfl:    rosuvastatin (CRESTOR) 40 MG tablet, Take 1 tablet (40 mg total) by mouth daily., Disp: 90 tablet, Rfl: 1  Allergies  Allergen Reactions   Aspirin     constipation    History reviewed: allergies, current medications, past family history, past medical history, past social history, past surgical history and problem list  Chronic issues discussed: Hypertension-compliant with medication, home blood pressure readings look good.  No chest pain, shortness of breath, dyspnea  Hyperlipidemia - compliant with medication without complaint  She notes that she quit smoking 2 months ago.  She would like referral to [facility] to help with varicose veins and spider veins that she would like resolved  In addition to vein issues her other big concern is ongoing chronic pain, arthritis which limits her ambulation.    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   Acute issues discussed: none  Objective:    BP 130/80   Pulse 79   Ht '5\' 3"'$  (1.6 m)   Wt 170 lb 4.8 oz (77.2 kg)   BMI 30.17 kg/m   Wt  Readings from Last 3 Encounters:  03/07/22 170 lb 4.8 oz (77.2 kg)  10/04/21 168 lb (76.2 kg)  09/28/21 168 lb (76.2 kg)   BP Readings from Last 3 Encounters:  03/07/22 130/80  11/12/21 (!) 159/54  09/24/21 130/80    Gen: wd,  wn, nad, white female Skin: Scattered macules, no worrisome lesions HEENT: normocephalic, sclerae anicteric, TMs pearly, nares patent, no discharge or erythema, pharynx normal Oral cavity: MMM, no lesions Neck: supple, no lymphadenopathy, no thyromegaly, no masses, no bruits Heart: RRR, normal S1, S2, no murmurs Lungs: Generalized decreased breath sounds, otherwise, no wheezes, rhonchi, or rales Abdomen: +bs, soft, non tender, non distended, no masses, no hepatomegaly, no splenomegaly, no bruits Musculoskeletal: She is somewhat cautious with ambulation particular getting up on the exam table due to arthritic pains, no obvious swelling, arthritic bony findings as bilateral knees, arms unremarkable Extremities: 1+ nonpitting bilat LE edema, +varicose and spider veins of bilat LE, no cyanosis, no clubbing Pulses: 2+ symmetric, upper and 1+ lower extremities, normal cap refill Neurological: alert, oriented x 3, CN2-12 intact, strength normal upper extremities and lower extremities, sensation normal throughout, DTRs 2+ throughout, no cerebellar signs, gait normal Psychiatric: normal affect, behavior normal, pleasant  Gyn, Breast rectal-declined  EKG reviewed    Assessment:   Encounter Diagnoses  Name Primary?   Varicose veins of both lower extremities, unspecified whether complicated Yes   Vaccine counseling    Tobacco use disorder    Vitamin D deficiency    Spider veins    Screening for lung cancer    S/P hysterectomy    Advance directive discussed with patient    Essential hypertension    Estrogen deficiency    Impaired fasting blood sugar    Mixed dyslipidemia    Neuropathy    Osteoporosis without current pathological fracture, unspecified  osteoporosis type    PVD (peripheral vascular disease) ([place])    Encounter for health maintenance examination in adult      Plan:   This visit was a preventative care visit, also known as wellness visit or routine physical.   Topics typically include healthy lifestyle, diet, exercise, preventative care, vaccinations, sick and well care, proper use of emergency dept and after hours care, as well as other concerns.     Recommendations: Continue to return yearly for your annual wellness and preventative care visits.  This gives [place] a chance to discuss healthy lifestyle, exercise, vaccinations, review your chart record, and perform screenings where appropriate.  I recommend you see your eye doctor yearly for routine vision care.  I recommend you see your dentist yearly for routine dental care including hygiene visits twice yearly.   Vaccination recommendations were reviewed Immunization History  Administered Date(s) Administered   Fluad Quad(high Dose 65+) 06/30/2019   Influenza Split 07/07/2007, 06/01/2014, 06/05/2015   Influenza, High Dose Seasonal PF 06/27/2021   Influenza-Unspecified 07/31/2016, 06/16/2017, 05/09/2018   Moderna Sars-Covid-2 Vaccination 04/11/2020, 05/11/2020, 05/03/2021   PPD Test 09/12/2014    I recommend the following 3 vaccines: Tetanus Tdap booster Shingrix Prenvar 20 pneumococcal vaccine  Have the pharmacy send [place] documentation when and if you go and do these    Screening for cancer: We typically do not do a lot of cancer screenings at this age other than colon cancer at age 16 and lung cancer screening.  If you have interest in other screenings or have other questions about this let me know.  I recommend a CT chest to screen for lung cancer and to check your lungs and heart.  You declined this today    Bone health: Get at least 150 minutes of aerobic exercise weekly Get weight bearing exercise at least once weekly  You have known osteoporosis.   You are due for repeat bone density but  you declined this today.  In addition to the weightbearing and aerobic exercise I recommend medication to slow down the rate of bone loss.  There are newer medications to give you other options to help reduce the rate of bone loss.  The big concern is if you fall and break a bone, it is much more difficult to heal and recover after a bone fracture at this age.    Heart health: Get at least 150 minutes of aerobic exercise weekly Limit alcohol It is important to maintain a healthy blood pressure and healthy cholesterol numbers  Heart disease screening: Screening for heart disease includes screening for blood pressure, fasting lipids, glucose/diabetes screening, BMI height to weight ratio, reviewed of smoking status, physical activity, and diet.    Goals include blood pressure 120/80 or less, maintaining a healthy lipid/cholesterol profile, preventing diabetes or keeping diabetes numbers under good control, not smoking or using tobacco products, exercising most days per week or at least 150 minutes per week of exercise, and eating healthy variety of fruits and vegetables, healthy oils, and avoiding unhealthy food choices like fried food, fast food, high sugar and high cholesterol foods.    I recommend chest CT to look at the lungs and the heart.  You declined this today.  Please reconsider    Medical care options: I recommend you continue to seek care here first for routine care.  We try really hard to have available appointments Monday through Friday daytime hours for sick visits, acute visits, and physicals.  Urgent care should be used for after hours and weekends for significant issues that cannot wait till the next day.  The emergency department should be used for significant potentially life-threatening emergencies.  The emergency department is expensive, can often have long wait times for less significant concerns, so try to utilize primary care, urgent  care, or telemedicine when possible to avoid unnecessary trips to the emergency department.  Virtual visits and telemedicine have been introduced since the pandemic started in 2020, and can be convenient ways to receive medical care.  We offer virtual appointments as well to assist you in a variety of options to seek medical care.   Advanced Directives: I recommend you consider completing a [place] and Living Will.   These documents respect your wishes and help alleviate burdens on your loved ones if you were to become terminally ill or be in a position to need those documents enforced.    You can complete Advanced Directives yourself, have them notarized, then have copies made for our office, for you and for anybody you feel should have them in safe keeping.  Or, you can have an attorney prepare these documents.   If you haven't updated your Last Will and Testament in a while, it may be worthwhile having an attorney prepare these documents together and save on some costs.       Significant separate issues: Varicose veins, vascular disease: I placed a referral today her request to [facility].  Her main concern is varicose veins and spider veins of the legs but you also need to see them for updated evaluation of peripheral arterial disease.  Also ask them as well about whether you can go ahead and do carotid ultrasound and abdominal aortic aneurysm screenings.  They probably do them at that office as well .  It is recommended for you to have a carotid ultrasound and abdominal aortic aneurysm ultrasound screening.  Tobacco use-I am  happy to hear you quit smoking 2 months ago!!!  Hypertension-continue lisinopril HCT  Hyperlipidemia-continue rosuvastatin Crestor cholesterol pill daily  Updated labs today  Neuropathy-you continue on gabapentin  Handicap placard card renewed today       Medicare Attestation A preventative services visit was completed  today.  During the course of the visit the patient was educated and counseled about appropriate screening and preventive services.  A health risk assessment was established with the patient that included a review of current medications, allergies, social history, family history, medical and preventative health history, biometrics, and preventative screenings to identify potential safety concerns or impairments.  A personalized plan was printed today for the patient's records and use.   Personalized health advice and education was given today to reduce health risks and promote self management and wellness.  Information regarding end of life planning was discussed today.  Dorothea Ogle, PA-C   03/07/2022

## 2022-03-07 NOTE — Patient Instructions (Signed)
This visit was a preventative care visit, also known as wellness visit or routine physical.   Topics typically include healthy lifestyle, diet, exercise, preventative care, vaccinations, sick and well care, proper use of emergency dept and after hours care, as well as other concerns.     Recommendations: Continue to return yearly for your annual wellness and preventative care visits.  This gives [place] a chance to discuss healthy lifestyle, exercise, vaccinations, review your chart record, and perform screenings where appropriate.  I recommend you see your eye doctor yearly for routine vision care.  I recommend you see your dentist yearly for routine dental care including hygiene visits twice yearly.   Vaccination recommendations were reviewed Immunization History  Administered Date(s) Administered   Fluad Quad(high Dose 65+) 06/30/2019   Influenza Split 07/07/2007, 06/01/2014, 06/05/2015   Influenza, High Dose Seasonal PF 06/27/2021   Influenza-Unspecified 07/31/2016, 06/16/2017, 05/09/2018   Moderna Sars-Covid-2 Vaccination 04/11/2020, 05/11/2020, 05/03/2021   PPD Test 09/12/2014    I recommend the following 3 vaccines: Tetanus Tdap booster Shingrix Prenvar 20 pneumococcal vaccine  Have the pharmacy send [place] documentation when and if you go and do these    Screening for cancer: We typically do not do a lot of cancer screenings at this age other than colon cancer at age 65 and lung cancer screening.  If you have interest in other screenings or have other questions about this let me know.  I recommend a CT chest to screen for lung cancer and to check your lungs and heart.  You declined this today    Bone health: Get at least 150 minutes of aerobic exercise weekly Get weight bearing exercise at least once weekly  You have known osteoporosis.  You are due for repeat bone density but you declined this today.  In addition to the weightbearing and aerobic exercise I recommend  medication to slow down the rate of bone loss.  There are newer medications to give you other options to help reduce the rate of bone loss.  The big concern is if you fall and break a bone, it is much more difficult to heal and recover after a bone fracture at this age.    Heart health: Get at least 150 minutes of aerobic exercise weekly Limit alcohol It is important to maintain a healthy blood pressure and healthy cholesterol numbers  Heart disease screening: Screening for heart disease includes screening for blood pressure, fasting lipids, glucose/diabetes screening, BMI height to weight ratio, reviewed of smoking status, physical activity, and diet.    Goals include blood pressure 120/80 or less, maintaining a healthy lipid/cholesterol profile, preventing diabetes or keeping diabetes numbers under good control, not smoking or using tobacco products, exercising most days per week or at least 150 minutes per week of exercise, and eating healthy variety of fruits and vegetables, healthy oils, and avoiding unhealthy food choices like fried food, fast food, high sugar and high cholesterol foods.    I recommend chest CT to look at the lungs and the heart.  You declined this today.  Please reconsider    Medical care options: I recommend you continue to seek care here first for routine care.  We try really hard to have available appointments Monday through Friday daytime hours for sick visits, acute visits, and physicals.  Urgent care should be used for after hours and weekends for significant issues that cannot wait till the next day.  The emergency department should be used for significant potentially life-threatening emergencies.  The emergency department is expensive, can often have long wait times for less significant concerns, so try to utilize primary care, urgent care, or telemedicine when possible to avoid unnecessary trips to the emergency department.  Virtual visits and telemedicine have  been introduced since the pandemic started in 2020, and can be convenient ways to receive medical care.  We offer virtual appointments as well to assist you in a variety of options to seek medical care.   Advanced Directives: I recommend you consider completing a [place] and Living Will.   These documents respect your wishes and help alleviate burdens on your loved ones if you were to become terminally ill or be in a position to need those documents enforced.    You can complete Advanced Directives yourself, have them notarized, then have copies made for our office, for you and for anybody you feel should have them in safe keeping.  Or, you can have an attorney prepare these documents.   If you haven't updated your Last Will and Testament in a while, it may be worthwhile having an attorney prepare these documents together and save on some costs.       Significant separate issues: Varicose veins, vascular disease: I placed a referral today her request to [facility].  Her main concern is varicose veins and spider veins of the legs but you also need to see them for updated evaluation of peripheral arterial disease.  Also ask them as well about whether you can go ahead and do carotid ultrasound and abdominal aortic aneurysm screenings.  They probably do them at that office as well .  It is recommended for you to have a carotid ultrasound and abdominal aortic aneurysm ultrasound screening.  Tobacco use-I am happy to hear you quit smoking 2 months ago!!!  Hypertension-continue lisinopril HCT  Hyperlipidemia-continue rosuvastatin Crestor cholesterol pill daily  Updated labs today  Neuropathy-you continue on gabapentin  Handicap placard card renewed today

## 2022-03-08 ENCOUNTER — Other Ambulatory Visit: Payer: Self-pay | Admitting: Medical

## 2022-03-08 LAB — CBC WITH DIFFERENTIAL/PLATELET
Basophils Absolute: 0 10*3/uL (ref 0.0–0.2)
Basos: 0 %
EOS (ABSOLUTE): 0.1 10*3/uL (ref 0.0–0.4)
Eos: 2 %
Hematocrit: 40.5 % (ref 34.0–46.6)
Hemoglobin: 13.6 g/dL (ref 11.1–15.9)
Immature Grans (Abs): 0 10*3/uL (ref 0.0–0.1)
Immature Granulocytes: 0 %
Lymphocytes Absolute: 2.5 10*3/uL (ref 0.7–3.1)
Lymphs: 46 %
MCH: 31.8 pg (ref 26.6–33.0)
MCHC: 33.6 g/dL (ref 31.5–35.7)
MCV: 95 fL (ref 79–97)
Monocytes Absolute: 0.5 10*3/uL (ref 0.1–0.9)
Monocytes: 9 %
Neutrophils Absolute: 2.4 10*3/uL (ref 1.4–7.0)
Neutrophils: 43 %
Platelets: 265 10*3/uL (ref 150–450)
RBC: 4.28 x10E6/uL (ref 3.77–5.28)
RDW: 12.3 % (ref 11.7–15.4)
WBC: 5.6 10*3/uL (ref 3.4–10.8)

## 2022-03-08 LAB — COMPREHENSIVE METABOLIC PANEL
ALT: 25 IU/L (ref 0–32)
AST: 26 IU/L (ref 0–40)
Albumin/Globulin Ratio: 2 (ref 1.2–2.2)
Albumin: 4.6 g/dL (ref 3.7–4.7)
Alkaline Phosphatase: 68 IU/L (ref 44–121)
BUN/Creatinine Ratio: 18 (ref 12–28)
BUN: 19 mg/dL (ref 8–27)
Bilirubin Total: 0.4 mg/dL (ref 0.0–1.2)
CO2: 23 mmol/L (ref 20–29)
Calcium: 10.2 mg/dL (ref 8.7–10.3)
Chloride: 101 mmol/L (ref 96–106)
Creatinine, Ser: 1.04 mg/dL — ABNORMAL HIGH (ref 0.57–1.00)
Globulin, Total: 2.3 g/dL (ref 1.5–4.5)
Glucose: 104 mg/dL — ABNORMAL HIGH (ref 70–99)
Potassium: 5.1 mmol/L (ref 3.5–5.2)
Sodium: 142 mmol/L (ref 134–144)
Total Protein: 6.9 g/dL (ref 6.0–8.5)
eGFR: 57 mL/min/{1.73_m2} — ABNORMAL LOW (ref >59)

## 2022-03-08 LAB — URINALYSIS
Bilirubin, UA: NEGATIVE
Glucose, UA: NEGATIVE
Ketones, UA: NEGATIVE
Leukocytes,UA: NEGATIVE
Nitrite, UA: NEGATIVE
Specific Gravity, UA: 1.019 (ref 1.005–1.030)
Urobilinogen, Ur: 0.2 mg/dL (ref 0.2–1.0)
pH, UA: 6 (ref 5.0–7.5)

## 2022-03-08 LAB — LIPID PANEL
Chol/HDL Ratio: 3.2 ratio (ref 0.0–4.4)
Cholesterol, Total: 118 mg/dL (ref 100–199)
HDL: 37 mg/dL — ABNORMAL LOW (ref >39)
LDL Chol Calc (NIH): 46 mg/dL (ref 0–99)
Triglycerides: 216 mg/dL — ABNORMAL HIGH (ref 0–149)
VLDL Cholesterol Cal: 35 mg/dL (ref 5–40)

## 2022-03-08 LAB — HEMOGLOBIN A1C
Est. average glucose Bld gHb Est-mCnc: 126 mg/dL
Hgb A1c MFr Bld: 6 % — ABNORMAL HIGH (ref 4.8–5.6)

## 2022-03-10 ENCOUNTER — Other Ambulatory Visit: Payer: Self-pay | Admitting: Medical

## 2022-03-13 ENCOUNTER — Telehealth: Payer: Self-pay | Admitting: Medical

## 2022-03-13 NOTE — Telephone Encounter (Signed)
Pt called and states that [name] was to send in either [place] or Rafael Hernandez. I do not see that was done. Pt advised to call insurance and see which one is covered by her insurance or is less expensive. Pt to call back and advise.

## 2022-03-13 NOTE — Telephone Encounter (Signed)
Pt called back and states she called her insurance and either medication is over $300.00 and she can't afford that. She would like another alternative. Pt can be reached at [phone]. pt advised [name] out of office.

## 2022-03-19 NOTE — Telephone Encounter (Signed)
Looks like the coupon card is good for [employer] or possibly covered for majority or medicare part D, but if she called her insurance then coupon card is not going to be covered. Best would send in alternative

## 2022-03-20 ENCOUNTER — Other Ambulatory Visit: Payer: Self-pay | Admitting: Medical

## 2022-03-20 NOTE — Telephone Encounter (Signed)
Pt was notified and will just hold off on meds

## 2022-05-15 ENCOUNTER — Encounter: Payer: Self-pay | Admitting: Internal Medicine

## 2022-06-18 ENCOUNTER — Encounter: Payer: Self-pay | Admitting: Internal Medicine

## 2022-06-28 ENCOUNTER — Other Ambulatory Visit: Payer: Self-pay | Admitting: Medical

## 2022-06-28 ENCOUNTER — Ambulatory Visit (INDEPENDENT_AMBULATORY_CARE_PROVIDER_SITE_OTHER): Payer: Medicare PPO | Admitting: Medical

## 2022-06-28 VITALS — BP 110/64 | HR 89 | Wt 167.6 lb

## 2022-06-28 DIAGNOSIS — I10 Essential (primary) hypertension: Secondary | ICD-10-CM

## 2022-06-28 DIAGNOSIS — Z122 Encounter for screening for malignant neoplasm of respiratory organs: Secondary | ICD-10-CM | POA: Diagnosis not present

## 2022-06-28 DIAGNOSIS — M25512 Pain in left shoulder: Secondary | ICD-10-CM | POA: Insufficient documentation

## 2022-06-28 DIAGNOSIS — E782 Mixed hyperlipidemia: Secondary | ICD-10-CM

## 2022-06-28 DIAGNOSIS — I739 Peripheral vascular disease, unspecified: Secondary | ICD-10-CM

## 2022-06-28 DIAGNOSIS — M810 Age-related osteoporosis without current pathological fracture: Secondary | ICD-10-CM | POA: Diagnosis not present

## 2022-06-28 DIAGNOSIS — R7301 Impaired fasting glucose: Secondary | ICD-10-CM

## 2022-06-28 DIAGNOSIS — R3129 Other microscopic hematuria: Secondary | ICD-10-CM | POA: Diagnosis not present

## 2022-06-28 DIAGNOSIS — Z1231 Encounter for screening mammogram for malignant neoplasm of breast: Secondary | ICD-10-CM

## 2022-06-28 DIAGNOSIS — Z23 Encounter for immunization: Secondary | ICD-10-CM

## 2022-06-28 DIAGNOSIS — Z87891 Personal history of nicotine dependence: Secondary | ICD-10-CM

## 2022-06-28 DIAGNOSIS — Z1389 Encounter for screening for other disorder: Secondary | ICD-10-CM | POA: Diagnosis not present

## 2022-06-28 DIAGNOSIS — Z7185 Encounter for immunization safety counseling: Secondary | ICD-10-CM

## 2022-06-28 DIAGNOSIS — Z136 Encounter for screening for cardiovascular disorders: Secondary | ICD-10-CM

## 2022-06-28 LAB — POCT URINALYSIS DIP (PROADVANTAGE DEVICE)
Bilirubin, UA: NEGATIVE
Glucose, UA: NEGATIVE mg/dL
Ketones, POC UA: NEGATIVE mg/dL
Leukocytes, UA: NEGATIVE
Nitrite, UA: NEGATIVE
Protein Ur, POC: NEGATIVE mg/dL
Specific Gravity, Urine: 1.01
Urobilinogen, Ur: NEGATIVE
pH, UA: 6 (ref 5.0–8.0)

## 2022-06-28 NOTE — Patient Instructions (Addendum)
Left shoulder pain I suspect the left shoulder sprain given the current symptoms and the way you injured the shoulder You can use over-the-counter Tylenol 500 mg or lower dose twice daily as needed for the next few days. You can use a few days of Aleve over-the-counter to help with pain and inflammation but just to the short-term for less than a week You can use cold therapy such as ice water pack to the left shoulder 20 minutes at a time several times this weekend for pain and inflammation Use an over-the-counter arm sling over the next 3 to 5 days to help rest the shoulder given the recent sprain injury I suspect your symptoms will improve over the next week.  If you are not seeing improvements within a week or worse within a week then let me know or do a follow-up with orthopedics    Screen for breast cancer and osteoporosis:  Please call to schedule your bone density test and screening mammogram.   [facility]  [phone] [address] [place], [place] [address]  I recommend in general aerobic and weightbearing exercise regularly.  I recommend continuing 1200 mg of calcium through supplement her diet and at least 2000 units of vitamin D daily.  I strongly recommend some type of treatment to help reduce the rate of future bone loss given history of osteoporosis and risk for fractures.  Impaired glucose/prediabetes-updated labs today  High blood pressure-continue current medications  High cholesterol-continue current medication  I am glad you quit smoking.  I recommend a chest CT test to evaluate for coronary artery disease and to screen for lung cancer.  You declined this today.  Please consider this and let me know when you are ready to move forward with this test.  Peripheral vascular disease-continue current medicines and walking regularly to keep blood flow adequate to the legs and extremities   Counseled on the influenza virus  vaccine.  Vaccine information sheet given.   High dose Influenza vaccine given after consent obtained.  The following vaccines are recommended: Updated shingles vaccine, tetnaus pertussis booster, covid vaccine, RSV vaccine, and pnuemococcal vaccine.  Get the shingles, tdap and RSV at your pharmacy

## 2022-06-28 NOTE — Progress Notes (Signed)
Subjective:  Alyssa Carter is a 76 y.o. female who presents for Chief Complaint  Patient presents with   follow-up on BS    Follow-up on BS. Running 117-119. Would like flu shot, declines covid, shingles and pneum shots Shoulder pain- hurt a pop carrying grocerys up the stairs      Patient Care Team: Collier Bohnet, Leward Quan as PCP - General (Family Medicine) Dr. Marcial Pacas, neurology Dr. Joni Fears, orthopedics Dr. Wilfrid Lund, GI   Here for some concerns and med check.    Acute left shoulder pain - Was carrying groceries on her left arm into house over weekend.   Felt a pop, and then has had some pain since, can't lift shoulder up but halfway.  using ice and heat.   No other treatment.  No fall or other treatment  Prediabetes-Here for f/u on blood sugars.  Checking glucose, running 117-119.    Hyperlipidemia-compliant with medication without complaint, rosuvastatin 40 mg daily  Hypertension-compliant with medication without complaint, lisinopril-HCTZ 20/12.5 mg daily  Quit smoking since last visit!  Osteoporosis-not on any current treatment, has declined treatment in the past as well.  No other aggravating or relieving factors.    No other c/o.  Past Medical History:  Diagnosis Date   Bilateral leg pain    Dyslipidemia    Full dentures    H/O bone density study 8/14   never   H/O mammogram 2010   Hematuria    microscopic, several prior evaluations, no source or cause found   Hyperlipidemia    Hypertension    Impaired fasting blood sugar 2008   Influenza vaccine side effect    intolerance, declines   Tobacco use    Wears glasses    Current Outpatient Medications on File Prior to Visit  Medication Sig Dispense Refill   gabapentin (NEURONTIN) 300 MG capsule Take 2 capsules (600 mg total) by mouth at bedtime. 180 capsule 4   lisinopril-hydrochlorothiazide (ZESTORETIC) 20-12.5 MG tablet TAKE 1 TABLET EVERY DAY 90 tablet 1   Multiple Vitamin (MULTIVITAMIN  WITH MINERALS) TABS tablet Take 1 tablet by mouth daily.     rosuvastatin (CRESTOR) 40 MG tablet TAKE 1 TABLET EVERY DAY 90 tablet 1   No current facility-administered medications on file prior to visit.    The following portions of the patient's history were reviewed and updated as appropriate: allergies, current medications, past family history, past medical history, past social history, past surgical history and problem list.  ROS Otherwise as in subjective above  Objective: BP 110/64   Pulse 89   Wt 167 lb 9.6 oz (76 kg)   BMI 29.69 kg/m   BP Readings from Last 3 Encounters:  06/28/22 110/64  03/07/22 130/80  11/12/21 (!) 159/54   Wt Readings from Last 3 Encounters:  06/28/22 167 lb 9.6 oz (76 kg)  03/07/22 170 lb 4.8 oz (77.2 kg)  10/04/21 168 lb (76.2 kg)    General appearance: alert, no distress, well developed, well nourished Skin: Unremarkable no obvious bruising or erythema left shoulder Neck: supple, normal eye motion, no lymphadenopathy, no thyromegaly, no masses Heart: RRR, normal S1, S2, no murmurs Lungs: Decreased breath sounds in general, no wheezes, rhonchi, or rales MSK: Left shoulder nontender to palpation, no obvious laxity or swelling, she does have pain with lateral range of motion over 80 degrees and pain with shoulder flexion over 90 degrees, positive Neer's, otherwise range of motion relatively normal and no other obvious deformity or laxity,  rest of the left arm unremarkable Pulses: 2+ radial pulses, 1+ pedal pulses, normal cap refill Ext: no edema   Assessment: Encounter Diagnoses  Name Primary?   Acute pain of left shoulder Yes   Essential hypertension    Impaired fasting blood sugar    Mixed dyslipidemia    Needs flu shot    Vaccine counseling    Screening for lung cancer    PVD (peripheral vascular disease) ([place])    Osteoporosis without current pathological fracture, unspecified osteoporosis type    Former smoker    Screening for  hematuria or proteinuria    Encounter for screening mammogram for malignant neoplasm of breast    Screening for heart disease      Plan: We discussed her concerns, acute shoulder pain and chronic issues as below.  We discussed following recommendations.  Patient Instructions  Left shoulder pain I suspect the left shoulder sprain given the current symptoms and the way you injured the shoulder You can use over-the-counter Tylenol 500 mg or lower dose twice daily as needed for the next few days. You can use a few days of Aleve over-the-counter to help with pain and inflammation but just to the short-term for less than a week You can use cold therapy such as ice water pack to the left shoulder 20 minutes at a time several times this weekend for pain and inflammation Use an over-the-counter arm sling over the next 3 to 5 days to help rest the shoulder given the recent sprain injury I suspect your symptoms will improve over the next week.  If you are not seeing improvements within a week or worse within a week then let me know or do a follow-up with orthopedics    Screen for breast cancer and osteoporosis:  Please call to schedule your bone density test and screening mammogram.   [facility]  [phone] [place], [place] [address]  I recommend in general aerobic and weightbearing exercise regularly.  I recommend continuing 1200 mg of calcium through supplement her diet and at least 2000 units of vitamin D daily.  I strongly recommend some type of treatment to help reduce the rate of future bone loss given history of osteoporosis and risk for fractures.  Impaired glucose/prediabetes-updated labs today  High blood pressure-continue current medications  High cholesterol-continue current medication  I am glad you quit smoking.  I recommend a chest CT test to evaluate for coronary artery disease and to screen for lung cancer.  You  declined this today.  Please consider this and let me know when you are ready to move forward with this test.  Peripheral vascular disease-continue current medicines and walking regularly to keep blood flow adequate to the legs and extremities   Counseled on the influenza virus vaccine.  Vaccine information sheet given.   High dose Influenza vaccine given after consent obtained.  The following vaccines are recommended: Updated shingles vaccine, tetnaus pertussis booster, covid vaccine, RSV vaccine, and pnuemococcal vaccine.  Get the shingles, tdap and RSV at your [place] was seen today for follow-up on bs.  Diagnoses and all orders for this visit:  Acute pain of left shoulder  Essential hypertension -     TSH -     POCT Urinalysis DIP (Proadvantage Device)  Impaired fasting blood sugar -     Hemoglobin A1c  Mixed dyslipidemia  Needs flu shot -     Flu Vaccine QUAD  High Dose(Fluad)  Vaccine counseling  Screening for lung cancer  PVD (peripheral vascular disease) ([place])  Osteoporosis without current pathological fracture, unspecified osteoporosis type -     VITAMIN D 25 Hydroxy (Vit-D Deficiency, Fractures) -     DG Bone Density; Future  Former smoker  Screening for hematuria or proteinuria -     POCT Urinalysis DIP (Proadvantage Device)  Encounter for screening mammogram for malignant neoplasm of breast -     MM DIGITAL SCREENING BILATERAL; Future  Screening for heart disease    Follow up: pending labs

## 2022-06-29 ENCOUNTER — Other Ambulatory Visit: Payer: Self-pay | Admitting: Medical

## 2022-06-29 DIAGNOSIS — R3129 Other microscopic hematuria: Secondary | ICD-10-CM

## 2022-06-29 LAB — HEMOGLOBIN A1C
Est. average glucose Bld gHb Est-mCnc: 126 mg/dL
Hgb A1c MFr Bld: 6 % — ABNORMAL HIGH (ref 4.8–5.6)

## 2022-06-29 LAB — URINALYSIS, MICROSCOPIC ONLY
Casts: NONE SEEN /lpf
WBC, UA: NONE SEEN /hpf (ref 0–5)

## 2022-06-29 LAB — TSH: TSH: 1.13 u[IU]/mL (ref 0.450–4.500)

## 2022-06-29 LAB — VITAMIN D 25 HYDROXY (VIT D DEFICIENCY, FRACTURES): Vit D, 25-Hydroxy: 25.5 ng/mL — ABNORMAL LOW (ref 30.0–100.0)

## 2022-06-29 MED ORDER — VITAMIN D 50 MCG (2000 UT) PO CAPS: 1.0000 | ORAL_CAPSULE | Freq: Every day | ORAL | 3 refills | Status: DC

## 2022-07-02 ENCOUNTER — Other Ambulatory Visit: Payer: Self-pay | Admitting: Internal Medicine

## 2022-07-02 ENCOUNTER — Other Ambulatory Visit: Payer: Self-pay | Admitting: Medical

## 2022-07-02 DIAGNOSIS — R319 Hematuria, unspecified: Secondary | ICD-10-CM

## 2022-07-02 DIAGNOSIS — R3129 Other microscopic hematuria: Secondary | ICD-10-CM

## 2022-07-02 MED ORDER — VITAMIN D 50 MCG (2000 UT) PO CAPS: 1.0000 | ORAL_CAPSULE | Freq: Every day | ORAL | 3 refills | Status: DC

## 2022-07-02 MED ORDER — ALENDRONATE SODIUM 70 MG PO TABS
70.0000 mg | ORAL_TABLET | ORAL | 3 refills | Status: DC
Start: 2022-07-02 — End: 2022-12-26

## 2022-07-02 NOTE — Progress Notes (Signed)
Urine still shows microscopic blood.  Please send for urine microscopic at the lab.  I did put an order in for this  Medication sent to [facility] as requested

## 2022-07-03 ENCOUNTER — Other Ambulatory Visit: Payer: Self-pay | Admitting: Medical

## 2022-07-03 DIAGNOSIS — M810 Age-related osteoporosis without current pathological fracture: Secondary | ICD-10-CM

## 2022-07-03 DIAGNOSIS — Z1231 Encounter for screening mammogram for malignant neoplasm of breast: Secondary | ICD-10-CM

## 2022-07-10 ENCOUNTER — Ambulatory Visit: Payer: Medicare PPO | Admitting: Orthopaedic Surgery

## 2022-07-10 ENCOUNTER — Ambulatory Visit (INDEPENDENT_AMBULATORY_CARE_PROVIDER_SITE_OTHER): Payer: Medicare PPO

## 2022-07-10 ENCOUNTER — Encounter: Payer: Self-pay | Admitting: Orthopaedic Surgery

## 2022-07-10 DIAGNOSIS — M25512 Pain in left shoulder: Secondary | ICD-10-CM | POA: Diagnosis not present

## 2022-07-10 DIAGNOSIS — M75102 Unspecified rotator cuff tear or rupture of left shoulder, not specified as traumatic: Secondary | ICD-10-CM

## 2022-07-10 DIAGNOSIS — M12812 Other specific arthropathies, not elsewhere classified, left shoulder: Secondary | ICD-10-CM | POA: Diagnosis not present

## 2022-07-10 NOTE — Progress Notes (Signed)
Office Visit Note   Patient: Alyssa Carter           Date of Birth: April 17, 1946           MRN: [id number] Visit Date: 07/10/2022              Requested by: Carlena Hurl, PA-C [address] [place],  [place] [address] PCP: Carlena Hurl, PA-C  Chief Complaint  Patient presents with   Left Shoulder - Pain      HPI: Ms. Hedstrom is a pleasant 76 year old woman with a 3-week history of left shoulder pain.  This occurred after she was lifting several bags of groceries out of her car.  She denies any previous issues with her left shoulder.  She has noted decrease in range of motion and it is quite painful for her to bring her arm up overhead.  She denies any neck pain or any paresthesias.  She is taking ibuprofen and using a sling.  Assessment & Plan: Visit Diagnoses:  1. Acute pain of left shoulder   2. Rotator cuff tear arthropathy of left shoulder     Plan: X-rays were reviewed today .there is no acute fracture. she does have a type II acromion with some hypertrophic bone at the [facility] joint.  Humeral head is centered about the glenoid.  Has had a prior rotator cuff tear repair of the right shoulder.  I think there is a reasonable chance that she has a rotator cuff tear in the left shoulder. recommend continuing with the sling for comfort.  Also an MRI to evaluate for a rotator cuff tear .if she had a full-thickness tear would be appropriate to have this repaired.  We will order an open MRI as she has had significant difficulty with claustrophobia and a traditional MRI.  Should follow-up once this is completed  Follow-Up Instructions: Return After MRI.   Ortho Exam  Patient is alert, oriented, no adenopathy, well-dressed, normal affect, normal respiratory effort. Examination of her left shoulder she has limited range of motion especially consistent with pain.  She is able to forward elevate actively to about 110 degrees.  Passively can go further and has a negative drop arm  test.  Pain with empty can testing.  Difficulty internally rotating her arm behind her back.  Strength was limited by painful symptoms she is neurovascularly intact distally no tenderness in her neck.  Some crepitation along the anterior subacromial region with internal/external rotation  Imaging: XR Shoulder Left  Result Date: 07/10/2022 Three-view radiographs of her left shoulder were reviewed today.  She has congruent spacing between the humeral head and the glenoid fossa.  No acute fractures are noted.  She does have a type II acromion with some hypertrophic bone at the [facility] joint.  Humeral head is centered in the glenoid with no ectopic calcification  No images are attached to the encounter.  Labs: Lab Results  Component Value Date   HGBA1C 6.0 (H) 06/28/2022   HGBA1C 6.0 (H) 03/07/2022   HGBA1C 5.9 (H) 05/25/2020   ESRSEDRATE 4 05/25/2020     Lab Results  Component Value Date   ALBUMIN 4.6 03/07/2022   ALBUMIN 5.0 (H) 07/07/2020   ALBUMIN 4.8 (H) 05/25/2020    Lab Results  Component Value Date   MG 2.0 08/30/2020   Lab Results  Component Value Date   VD25OH 25.5 (L) 06/28/2022   VD25OH 32.1 08/30/2020   VD25OH 31.2 02/13/2018    No results found for: "  PREALBUMIN"    Latest Ref Rng & Units 03/07/2022   11:09 AM 05/25/2020   10:15 AM 08/20/2019   12:20 PM  CBC EXTENDED  WBC 3.4 - 10.8 x10E3/uL 5.6  7.7  6.3   RBC 3.77 - 5.28 x10E6/uL 4.28  4.15  4.27   Hemoglobin 11.1 - 15.9 g/dL 13.6  13.5  13.7   HCT 34.0 - 46.6 % 40.5  39.0  39.4   Platelets 150 - 450 x10E3/uL 265  278  278   NEUT# 1.4 - 7.0 x10E3/uL 2.4  4.9  3.3   Lymph# 0.7 - 3.1 x10E3/uL 2.5  2.1  2.5      There is no height or weight on file to calculate BMI.  Orders:  Orders Placed This Encounter  Procedures   XR Shoulder Left   MR Shoulder Left w/o contrast   No orders of the defined types were placed in this encounter.    Procedures: No procedures performed  Clinical Data: No additional  findings.  ROS:  All other systems negative, except as noted in the HPI. Review of Systems  All other systems reviewed and are negative.   Objective: Vital Signs: There were no vitals taken for this visit.  Specialty Comments:  No specialty comments available.  PMFS History: Patient Active Problem List   Diagnosis Date Noted   Rotator cuff tear arthropathy of left shoulder 07/10/2022   Former smoker 06/28/2022   Acute pain of left shoulder 06/28/2022   Screening for hematuria or proteinuria 06/28/2022   Screening for heart disease 06/28/2022   Encounter for screening mammogram for malignant neoplasm of breast 06/28/2022   Needs flu shot 06/28/2022   Bunion of right foot 03/28/2021   Flat feet 03/28/2021   Bunion 03/28/2021   Radiculopathy, lumbar region 10/19/2020   Right cervical radiculopathy 10/19/2020   Muscle cramp 08/30/2020   Right leg paresthesias 08/08/2020   Phlebitis 07/07/2020   Right leg pain 07/07/2020   Screening for lung cancer 07/07/2020   Medicare annual wellness visit, subsequent 05/25/2020   Neuropathy 05/25/2020   Vaccine counseling 05/25/2020   Leg cramping 05/25/2020   Varicose veins of both lower extremities 05/25/2020   Spider veins 05/25/2020   Ringing in ear, bilateral 05/25/2020   Impaired fasting blood sugar 08/20/2019   Vitamin D deficiency 08/20/2019   S/P hysterectomy 08/20/2019   Osteoporosis without current pathological fracture 08/27/2017   Encounter for health maintenance examination in adult 01/08/2017   Estrogen deficiency 01/08/2017   PVD (peripheral vascular disease) ([place]) 01/08/2017   Chronic right shoulder pain 01/08/2017   Paresthesia of both lower extremities 05/22/2015   Essential hypertension 05/22/2015   Mixed dyslipidemia 05/22/2015   History of back surgery 05/22/2015   Advance directive discussed with patient 05/22/2015   Past Medical History:  Diagnosis Date   Bilateral leg pain    Dyslipidemia    Full  dentures    H/O bone density study 8/14   never   H/O mammogram 2010   Hematuria    microscopic, several prior evaluations, no source or cause found   Hyperlipidemia    Hypertension    Impaired fasting blood sugar 2008   Influenza vaccine side effect    intolerance, declines   Tobacco use    Wears glasses     Family History  Problem Relation Age of Onset   Alzheimer's disease Mother    Other Mother        died of UTI, dehydration   Other  Father 78       failure to thrive, old age, fall and rib fracture   Diabetes Father    Diabetes Sister    Cancer Brother        brain, stomach   Colon cancer Neg Hx     Past Surgical History:  Procedure Laterality Date   ABDOMINAL HYSTERECTOMY  1980   total; due to heavy bleeding   APPENDECTOMY  1980   KNEE SURGERY     right   SHOULDER SURGERY     SPINE SURGERY     lumbar   TONSILLECTOMY     TUBAL LIGATION     Social History   Occupational History   Occupation: Receptionist - part time  Tobacco Use   Smoking status: Every Day    Packs/day: 0.50    Years: 40.00    Total pack years: 20.00    Types: Cigarettes   Smokeless tobacco: Never  Vaping Use   Vaping Use: Never used  Substance and Sexual Activity   Alcohol use: No   Drug use: No   Sexual activity: Not on file

## 2022-07-12 ENCOUNTER — Other Ambulatory Visit: Payer: Self-pay | Admitting: Physician Assistant

## 2022-07-12 MED ORDER — DIAZEPAM 5 MG PO TABS: 5.0000 mg | ORAL_TABLET | Freq: Four times a day (QID) | ORAL | 0 refills | Status: DC | PRN

## 2022-07-12 NOTE — Telephone Encounter (Unsigned)
Pt called in stating that she is having her MRI done on Nov 14.... Pt stated that Dr. Durward Fortes advised her that he will prescribe her something to relax her... Pt requesting callback.Marland KitchenMarland Kitchen

## 2022-07-23 DIAGNOSIS — M75112 Incomplete rotator cuff tear or rupture of left shoulder, not specified as traumatic: Secondary | ICD-10-CM | POA: Diagnosis not present

## 2022-07-23 DIAGNOSIS — M75122 Complete rotator cuff tear or rupture of left shoulder, not specified as traumatic: Secondary | ICD-10-CM | POA: Diagnosis not present

## 2022-07-23 DIAGNOSIS — M19012 Primary osteoarthritis, left shoulder: Secondary | ICD-10-CM | POA: Diagnosis not present

## 2022-07-23 DIAGNOSIS — M67814 Other specified disorders of tendon, left shoulder: Secondary | ICD-10-CM | POA: Diagnosis not present

## 2022-07-25 ENCOUNTER — Encounter: Payer: Self-pay | Admitting: Orthopaedic Surgery

## 2022-07-25 ENCOUNTER — Ambulatory Visit: Payer: Medicare PPO | Admitting: Orthopaedic Surgery

## 2022-07-25 DIAGNOSIS — M12812 Other specific arthropathies, not elsewhere classified, left shoulder: Secondary | ICD-10-CM | POA: Diagnosis not present

## 2022-07-25 DIAGNOSIS — M75102 Unspecified rotator cuff tear or rupture of left shoulder, not specified as traumatic: Secondary | ICD-10-CM | POA: Diagnosis not present

## 2022-07-25 DIAGNOSIS — M25512 Pain in left shoulder: Secondary | ICD-10-CM | POA: Diagnosis not present

## 2022-07-25 NOTE — Progress Notes (Signed)
Office Visit Note   Patient: Alyssa Carter           Date of Birth: Aug 12, 1946           MRN: [id number] Visit Date: 07/25/2022              Requested by: Carlena Hurl, PA-C [address] [place],  [place] [address] PCP: Carlena Hurl, PA-C   Assessment & Plan: Visit Diagnoses:  1. Acute pain of left shoulder   2. Rotator cuff tear arthropathy of left shoulder     Plan: Ms. Stipe returns today to follow-up on the MRI of her left shoulder.  She has a history of lifting some groceries and had onset of acute left shoulder pain.  She had a right rotator cuff repair and subacromial decompression a few years ago and has done quite well.  Her MRI was reviewed with her today.  She has full-thickness tears of the supraspinatus and infraspinatus.  She also has severe acromioclavicular joint degenerative changes with subacromial spurring.  Discussed with her best option would be referral to Dr. Marlou Sa to discuss a left shoulder arthroscopy subacromial decompression distal clavicle excision and rotator cuff repair.  She is in agreement with this plan and we will see him after Thanksgiving.  She is thinking she like to do surgery if possible after the holidays.  Although she has significant pain raising her arm overhead she has a negative drop arm test.  Her strength is fairly good.  Follow-Up Instructions: Return With Dr. Marlou Sa.   Orders:  Orders Placed This Encounter  Procedures   Ambulatory referral to Orthopedic Surgery   No orders of the defined types were placed in this encounter.     Procedures: No procedures performed   Clinical Data: No additional findings.   Subjective: Chief Complaint  Patient presents with   Left Shoulder - Follow-up    MRI review  Patient presents today for follow up on her left shoulder. She had an MRI on her left shoulder at [facility]. She is here today for those results.  No change in symptoms    Review of Systems  All other systems  reviewed and are negative.    Objective: Vital Signs: There were no vitals taken for this visit.  Physical Exam Constitutional:      Appearance: Normal appearance.  Pulmonary:     Effort: Pulmonary effort is normal.  Skin:    General: Skin is warm and dry.  Neurological:     Mental Status: She is alert.     Ortho Exam Examination of her left shoulder: She is neurovascularly intact.  She does have almost full forward elevation but is quite painful.  She has a negative drop arm test.  Mild pain with empty can test positive impingement findings.  She has good strength with resisted external and internal rotation.  No crepitation.  There is some anterior subacromial discomfort to palpation.  There was some discomfort with the speeds sign possibly indicative of an issue with the long head of the biceps tendon Specialty Comments:  No specialty comments available.  Imaging: No results found.   PMFS History: Patient Active Problem List   Diagnosis Date Noted   Rotator cuff tear arthropathy of left shoulder 07/10/2022   Former smoker 06/28/2022   Acute pain of left shoulder 06/28/2022   Screening for hematuria or proteinuria 06/28/2022   Screening for heart disease 06/28/2022   Encounter for screening mammogram for malignant neoplasm of  breast 06/28/2022   Needs flu shot 06/28/2022   Bunion of right foot 03/28/2021   Flat feet 03/28/2021   Bunion 03/28/2021   Radiculopathy, lumbar region 10/19/2020   Right cervical radiculopathy 10/19/2020   Muscle cramp 08/30/2020   Right leg paresthesias 08/08/2020   Phlebitis 07/07/2020   Right leg pain 07/07/2020   Screening for lung cancer 07/07/2020   Medicare annual wellness visit, subsequent 05/25/2020   Neuropathy 05/25/2020   Vaccine counseling 05/25/2020   Leg cramping 05/25/2020   Varicose veins of both lower extremities 05/25/2020   Spider veins 05/25/2020   Ringing in ear, bilateral 05/25/2020   Impaired fasting blood sugar  08/20/2019   Vitamin D deficiency 08/20/2019   S/P hysterectomy 08/20/2019   Osteoporosis without current pathological fracture 08/27/2017   Encounter for health maintenance examination in adult 01/08/2017   Estrogen deficiency 01/08/2017   PVD (peripheral vascular disease) ([place]) 01/08/2017   Chronic right shoulder pain 01/08/2017   Paresthesia of both lower extremities 05/22/2015   Essential hypertension 05/22/2015   Mixed dyslipidemia 05/22/2015   History of back surgery 05/22/2015   Advance directive discussed with patient 05/22/2015   Past Medical History:  Diagnosis Date   Bilateral leg pain    Dyslipidemia    Full dentures    H/O bone density study 8/14   never   H/O mammogram 2010   Hematuria    microscopic, several prior evaluations, no source or cause found   Hyperlipidemia    Hypertension    Impaired fasting blood sugar 2008   Influenza vaccine side effect    intolerance, declines   Tobacco use    Wears glasses     Family History  Problem Relation Age of Onset   Alzheimer's disease Mother    Other Mother        died of UTI, dehydration   Other Father 10       failure to thrive, old age, fall and rib fracture   Diabetes Father    Diabetes Sister    Cancer Brother        brain, stomach   Colon cancer Neg Hx     Past Surgical History:  Procedure Laterality Date   ABDOMINAL HYSTERECTOMY  1980   total; due to heavy bleeding   APPENDECTOMY  1980   KNEE SURGERY     right   SHOULDER SURGERY     SPINE SURGERY     lumbar   TONSILLECTOMY     TUBAL LIGATION     Social History   Occupational History   Occupation: Receptionist - part time  Tobacco Use   Smoking status: Every Day    Packs/day: 0.50    Years: 40.00    Total pack years: 20.00    Types: Cigarettes   Smokeless tobacco: Never  Vaping Use   Vaping Use: Never used  Substance and Sexual Activity   Alcohol use: No   Drug use: No   Sexual activity: Not on file

## 2022-07-31 ENCOUNTER — Ambulatory Visit: Payer: Medicare PPO | Admitting: Orthopedic Surgery

## 2022-07-31 ENCOUNTER — Encounter: Payer: Self-pay | Admitting: Orthopedic Surgery

## 2022-07-31 DIAGNOSIS — M75122 Complete rotator cuff tear or rupture of left shoulder, not specified as traumatic: Secondary | ICD-10-CM

## 2022-07-31 NOTE — Progress Notes (Signed)
Office Visit Note   Patient: Alyssa Carter           Date of Birth: [birth year]           MRN: [id number] Visit Date: 07/31/2022 Requested by: Garald Balding, [address],  [place] [address] PCP: Caryl Ada  Subjective: Chief Complaint  Patient presents with   Left Shoulder - Pain    HPI: Alyssa Carter is a 76 y.o. female who presents to the office reporting left shoulder pain.  Been going on for months.  Started after lifting some groceries 2 months ago.  Denied any bruising at that time.  She did have successful rotator cuff surgery done about 4 years ago.  She is right-hand dominant.  Lives alone.  She is a long-term smoker and has a history of osteoporosis.  Shoulder is not getting any better.  Denies any superior AC joint tenderness.  MRI scan is reviewed.  Shows supraspinatus tendon full-thickness tear with retraction and mild atrophy.  There is also full-thickness tear of the infraspinatus with mild atrophy and some retraction.  Also intracapsular tendinosis of the long head of the biceps tendon.  Soft scan also shows significant AC joint arthritis..                ROS: All systems reviewed are negative as they relate to the chief complaint within the history of present illness.  Patient denies fevers or chills.  Assessment & Plan: Visit Diagnoses:  1. Complete tear of left rotator cuff, unspecified whether traumatic     Plan: Impression is left shoulder rotator cuff tear with mild retraction of the tendons.  Patient has AC joint arthritis but no discrete tenderness of the AC joint at this time.  She may have an early frozen shoulder.  Difficulties with surgical fixation in this patient is related to her long-term smoking use as well as osteoporosis.  That could make fixation difficult.  Nonetheless based on her successful outcome from 4 years ago and her reluctance to consider reverse replacement which is not really indicated at this time unless  she does fail rotator cuff repair I think an attempt at rotator cuff repair is indicated.  We will have to protect the repair slightly longer than for most people based on her medical comorbidities.  Risk and benefits are discussed with the patient including not limited to infection or vessel damage incomplete healing nonhealing and potential need for revision surgery which in her case would likely be reverse shoulder replacement.  Patient understands risk benefits.  All questions answered.  Follow-Up Instructions: No follow-ups on file.   Orders:  No orders of the defined types were placed in this encounter.  No orders of the defined types were placed in this encounter.     Procedures: No procedures performed   Clinical Data: No additional findings.  Objective: Vital Signs: There were no vitals taken for this visit.  Physical Exam:  Constitutional: Patient appears well-developed HEENT:  Head: Normocephalic Eyes:EOM are normal Neck: Normal range of motion Cardiovascular: Normal rate Pulmonary/chest: Effort normal Neurologic: Patient is alert Skin: Skin is warm Psychiatric: Patient has normal mood and affect  Ortho Exam: Ortho exam demonstrates no tenderness at the [facility] joint on the left compared to right.  She does have some weakness to supraspinatus and infraspinatus testing on the left subscap strength is intact.  No Popeye deformity present.  Passive range of motion is 50/90/150.  Active range of  motion remains below shoulder level to just below 90 degrees of forward flexion and abduction.  She does have some grinding and crepitus with internal/external rotation of that left arm at 90 degrees of abduction.  Specialty Comments:  No specialty comments available.  Imaging: No results found.   PMFS History: Patient Active Problem List   Diagnosis Date Noted   Rotator cuff tear arthropathy of left shoulder 07/10/2022   Former smoker 06/28/2022   Acute pain of left shoulder  06/28/2022   Screening for hematuria or proteinuria 06/28/2022   Screening for heart disease 06/28/2022   Encounter for screening mammogram for malignant neoplasm of breast 06/28/2022   Needs flu shot 06/28/2022   Bunion of right foot 03/28/2021   Flat feet 03/28/2021   Bunion 03/28/2021   Radiculopathy, lumbar region 10/19/2020   Right cervical radiculopathy 10/19/2020   Muscle cramp 08/30/2020   Right leg paresthesias 08/08/2020   Phlebitis 07/07/2020   Right leg pain 07/07/2020   Screening for lung cancer 07/07/2020   Medicare annual wellness visit, subsequent 05/25/2020   Neuropathy 05/25/2020   Vaccine counseling 05/25/2020   Leg cramping 05/25/2020   Varicose veins of both lower extremities 05/25/2020   Spider veins 05/25/2020   Ringing in ear, bilateral 05/25/2020   Impaired fasting blood sugar 08/20/2019   Vitamin D deficiency 08/20/2019   S/P hysterectomy 08/20/2019   Osteoporosis without current pathological fracture 08/27/2017   Encounter for health maintenance examination in adult 01/08/2017   Estrogen deficiency 01/08/2017   PVD (peripheral vascular disease) ([place]) 01/08/2017   Chronic right shoulder pain 01/08/2017   Paresthesia of both lower extremities 05/22/2015   Essential hypertension 05/22/2015   Mixed dyslipidemia 05/22/2015   History of back surgery 05/22/2015   Advance directive discussed with patient 05/22/2015   Past Medical History:  Diagnosis Date   Bilateral leg pain    Dyslipidemia    Full dentures    H/O bone density study 8/14   never   H/O mammogram 2010   Hematuria    microscopic, several prior evaluations, no source or cause found   Hyperlipidemia    Hypertension    Impaired fasting blood sugar 2008   Influenza vaccine side effect    intolerance, declines   Tobacco use    Wears glasses     Family History  Problem Relation Age of Onset   Alzheimer's disease Mother    Other Mother        died of UTI, dehydration   Other Father  75       failure to thrive, old age, fall and rib fracture   Diabetes Father    Diabetes Sister    Cancer Brother        brain, stomach   Colon cancer Neg Hx     Past Surgical History:  Procedure Laterality Date   ABDOMINAL HYSTERECTOMY  1980   total; due to heavy bleeding   APPENDECTOMY  1980   KNEE SURGERY     right   SHOULDER SURGERY     SPINE SURGERY     lumbar   TONSILLECTOMY     TUBAL LIGATION     Social History   Occupational History   Occupation: Receptionist - part time  Tobacco Use   Smoking status: Every Day    Packs/day: 0.50    Years: 40.00    Total pack years: 20.00    Types: Cigarettes   Smokeless tobacco: Never  Vaping Use   Vaping Use: Never  used  Substance and Sexual Activity   Alcohol use: No   Drug use: No   Sexual activity: Not on file

## 2022-08-03 ENCOUNTER — Encounter: Payer: Self-pay | Admitting: Orthopedic Surgery

## 2022-08-13 NOTE — Pre-Procedure Instructions (Signed)
Surgical Instructions    Your procedure is scheduled on Tuesday, August 20, 2022 at 4:04 PM.  Report to [facility] Main Entrance "A" at 2:00 PM., then check in with the Admitting office.  Call this number if you have problems the morning of surgery:  [phone]   If you have any questions prior to your surgery date call [phone]: Open Monday-Friday 8am-4pm  *If you experience any cold or flu symptoms such as cough, fever, chills, shortness of breath, etc. between now and your scheduled surgery, please notify us.*    Remember:  Do not eat after midnight the night before your surgery  You may drink clear liquids until 1:00 PM the day of your surgery.   Clear liquids allowed are: Water, Non-Citrus Juices (without pulp), Carbonated Beverages, Clear Tea, Black Coffee Only (NO MILK, CREAM OR POWDERED CREAMER of any kind), and Gatorade.  Patient Instructions  The night before surgery:  No food after midnight. ONLY clear liquids after midnight  The day of surgery (if you do NOT have diabetes):  Drink ONE (1) Pre-Surgery Clear Ensure by 1:00 PM the morning of surgery. Drink in one sitting. Do not sip.  This drink was given to you during your hospital  pre-op appointment visit.  Nothing else to drink after completing the  Pre-Surgery Clear Ensure.         If you have questions, please contact your surgeon's office.     Take these medicines the morning of surgery with A SIP OF WATER:   rosuvastatin (CRESTOR)  alendronate (FOSAMAX) - if needed   As of today, STOP taking any Aspirin (unless otherwise instructed by your surgeon) Aleve, Naproxen, Ibuprofen, Motrin, Advil, Goody's, BC's, all herbal medications, fish oil, and all vitamins.                     Do NOT Smoke (Tobacco/Vaping) for 24 hours prior to your procedure.  If you use a CPAP at night, you may bring your mask/headgear for your overnight stay.   Contacts, glasses, piercing's, hearing aid's, dentures or  partials may not be worn into surgery, please bring cases for these belongings.    For patients admitted to the hospital, discharge time will be determined by your treatment team.   Patients discharged the day of surgery will not be allowed to drive home, and someone needs to stay with them for 24 hours.  SURGICAL WAITING ROOM VISITATION Patients having surgery or a procedure may have two support people in the waiting area. Visitors may stay in the waiting area during the procedure and switch out with other visitors if needed. Only 1 support person is allowed in the pre-op area with the patient AFTER the patient is prepped. This person cannot be switched out. Children under the age of 63 must have an adult accompany them who is not the patient. If the patient needs to stay at the hospital during part of their recovery, the visitor guidelines for inpatient rooms apply.  Please refer to the [facility] website for the visitor guidelines for Inpatients (after your surgery is over and you are in a regular room).    Special instructions:   [place]- Preparing For Surgery  Before surgery, you can play an important role. Because skin is not sterile, your skin needs to be as free of germs as possible. You can reduce the number of germs on your skin by washing with CHG (chlorahexidine gluconate) Soap before surgery.  CHG is an  antiseptic cleaner which kills germs and bonds with the skin to continue killing germs even after washing.    Oral Hygiene is also important to reduce your risk of infection.  Remember - BRUSH YOUR TEETH THE MORNING OF SURGERY WITH YOUR REGULAR TOOTHPASTE  Please do not use if you have an allergy to CHG or antibacterial soaps. If your skin becomes reddened/irritated stop using the CHG.  Do not shave (including legs and underarms) for at least 48 hours prior to first CHG shower. It is OK to shave your face.  Please follow these instructions carefully.   Shower the NIGHT  BEFORE SURGERY and the MORNING OF SURGERY  If you chose to wash your hair, wash your hair first as usual with your normal shampoo.  After you shampoo, rinse your hair and body thoroughly to remove the shampoo.  Use CHG Soap as you would any other liquid soap. You can apply CHG directly to the skin and wash gently with a scrungie or a clean washcloth.   Apply the CHG Soap to your body ONLY FROM THE NECK DOWN.  Do not use on open wounds or open sores. Avoid contact with your eyes, ears, mouth and genitals (private parts). Wash Face and genitals (private parts)  with your normal soap.   Wash thoroughly, paying special attention to the area where your surgery will be performed.  Thoroughly rinse your body with warm water from the neck down.  DO NOT shower/wash with your normal soap after using and rinsing off the CHG Soap.  Pat yourself dry with a CLEAN TOWEL.  Wear CLEAN PAJAMAS to bed the night before surgery  Place CLEAN SHEETS on your bed the night before your surgery  DO NOT SLEEP WITH PETS.   Day of Surgery: Take a shower with CHG soap. Do not wear jewelry. Do not wear lotions, powders, perfumes/colognes, or deodorant. Do not shave 48 hours prior to surgery.  Men may shave face and neck. Wear Clean/Comfortable clothing the morning of surgery Do not bring valuables to the hospital.  [facility] is not responsible for any belongings or valuables. Remember to brush your teeth WITH YOUR REGULAR TOOTHPASTE.   Please read over the following fact sheets that you were given.  If you received a COVID test during your pre-op visit  it is requested that you wear a mask when out in public, stay away from anyone that may not be feeling well and notify your surgeon if you develop symptoms. If you have been in contact with anyone that has tested positive in the last 10 days please notify you surgeon.

## 2022-08-14 ENCOUNTER — Encounter (HOSPITAL_COMMUNITY)
Admission: RE | Admit: 2022-08-14 | Discharge: 2022-08-14 | Disposition: A | Discharge status: Home / Self Care | Payer: Medicare PPO | Source: Ambulatory Visit | Attending: Orthopedic Surgery | Admitting: Orthopedic Surgery

## 2022-08-14 ENCOUNTER — Other Ambulatory Visit: Payer: Self-pay

## 2022-08-14 ENCOUNTER — Encounter (HOSPITAL_COMMUNITY): Payer: Self-pay

## 2022-08-14 DIAGNOSIS — Z01812 Encounter for preprocedural laboratory examination: Secondary | ICD-10-CM | POA: Insufficient documentation

## 2022-08-14 DIAGNOSIS — R7303 Prediabetes: Secondary | ICD-10-CM | POA: Insufficient documentation

## 2022-08-14 DIAGNOSIS — Z01818 Encounter for other preprocedural examination: Secondary | ICD-10-CM

## 2022-08-14 HISTORY — DX: Prediabetes: R73.03

## 2022-08-14 LAB — BASIC METABOLIC PANEL
Anion gap: 8 (ref 5–15)
BUN: 25 mg/dL — ABNORMAL HIGH (ref 8–23)
CO2: 27 mmol/L (ref 22–32)
Calcium: 9.9 mg/dL (ref 8.9–10.3)
Chloride: 105 mmol/L (ref 98–111)
Creatinine, Ser: 1.11 mg/dL — ABNORMAL HIGH (ref 0.44–1.00)
GFR, Estimated: 52 mL/min — ABNORMAL LOW (ref >60)
Glucose, Bld: 99 mg/dL (ref 70–99)
Potassium: 4.8 mmol/L (ref 3.5–5.1)
Sodium: 140 mmol/L (ref 135–145)

## 2022-08-14 LAB — GLUCOSE, CAPILLARY: Glucose-Capillary: 111 mg/dL — ABNORMAL HIGH (ref 70–99)

## 2022-08-14 LAB — CBC
HCT: 42.3 % (ref 36.0–46.0)
Hemoglobin: 14.3 g/dL (ref 12.0–15.0)
MCH: 32 pg (ref 26.0–34.0)
MCHC: 33.8 g/dL (ref 30.0–36.0)
MCV: 94.6 fL (ref 80.0–100.0)
Platelets: 298 10*3/uL (ref 150–400)
RBC: 4.47 MIL/uL (ref 3.87–5.11)
RDW: 12.4 % (ref 11.5–15.5)
WBC: 6.7 10*3/uL (ref 4.0–10.5)
nRBC: 0 % (ref 0.0–0.2)

## 2022-08-14 NOTE — Pre-Procedure Instructions (Signed)
Surgical Instructions    Your procedure is scheduled on Tuesday, August 20, 2022 at 4:04 PM.  Report to [facility]tr[facility] "A" at 2:00 PM., then check in with the Admitting office.  Call this number if you have problems the morning of surgery:  [phone]   If you have any questions prior to your surgery date call [phone]: Open Monday-Friday 8am-4pm  *If you experience any cold or flu symptoms such as cough, fever, chills, shortness of breath, etc. between now and your scheduled surgery, please notify us.*    Remember:  Do not eat after midnight the night before your surgery  You may drink clear liquids until 1:00 PM the day of your surgery.   Clear liquids allowed are: Water, Non-Citrus Juices (without pulp), Carbonated Beverages, Clear Tea, Black Coffee Only (NO MILK, CREAM OR POWDERED CREAMER of any kind), and Gatorade.  Patient Instructions  The night before surgery:  No food after midnight. ONLY clear liquids after midnight   The day of surgery (if you have diabetes): Drink ONE (1) 12 oz G2 given to you in your pre admission testing appointment by 1:00PM the day of surgery. Drink in one sitting. Do not sip.  This drink was given to you during your hospital  pre-op appointment visit.  Nothing else to drink after completing the  12 oz bottle of G2.         If you have questions, please contact your surgeon's office.      Take these medicines the morning of surgery with A SIP OF WATER: NONE   As of today, STOP taking any Aspirin (unless otherwise instructed by your surgeon) Aleve, Naproxen, Ibuprofen, Motrin, Advil, Goody's, BC's, all herbal medications, fish oil, and all vitamins.   HOW TO MANAGE YOUR DIABETES BEFORE AND AFTER SURGERY  Why is it important to control my blood sugar before and after surgery? Improving blood sugar levels before and after surgery helps healing and can limit problems. A way of improving blood sugar control is eating a  healthy diet by:  Eating less sugar and carbohydrates  Increasing activity/exercise  Talking with your doctor about reaching your blood sugar goals High blood sugars (greater than 180 mg/dL) can raise your risk of infections and slow your recovery, so you will need to focus on controlling your diabetes during the weeks before surgery. Make sure that the doctor who takes care of your diabetes knows about your planned surgery including the date and location.  How do I manage my blood sugar before surgery? Check your blood sugar at least 4 times a day, starting 2 days before surgery, to make sure that the level is not too high or low.  Check your blood sugar the morning of your surgery when you wake up and every 2 hours until you get to the Short Stay unit.  If your blood sugar is less than 70 mg/dL, you will need to treat for low blood sugar: Do not take insulin. Treat a low blood sugar (less than 70 mg/dL) with  cup of clear juice (cranberry or apple), 4 glucose tablets, OR glucose gel. Recheck blood sugar in 15 minutes after treatment (to make sure it is greater than 70 mg/dL). If your blood sugar is not greater than 70 mg/dL on recheck, call [phone] for further instructions. Report your blood sugar to the short stay nurse when you get to Short Stay.  If you are admitted to the hospital after surgery: Your blood sugar will  be checked by the staff and you will probably be given insulin after surgery (instead of oral diabetes medicines) to make sure you have good blood sugar levels. The goal for blood sugar control after surgery is 80-180 mg/dL.                     Do NOT Smoke (Tobacco/Vaping) for 24 hours prior to your procedure.  If you use a CPAP at night, you may bring your mask/headgear for your overnight stay.   Contacts, glasses, piercing's, hearing aid's, dentures or partials may not be worn into surgery, please bring cases for these belongings.    For patients admitted to  the hospital, discharge time will be determined by your treatment team.   Patients discharged the day of surgery will not be allowed to drive home, and someone needs to stay with them for 24 hours.  SURGICAL WAITING ROOM VISITATION Patients having surgery or a procedure may have two support people in the waiting area. Visitors may stay in the waiting area during the procedure and switch out with other visitors if needed. Only 1 support person is allowed in the pre-op area with the patient AFTER the patient is prepped. This person cannot be switched out. Children under the age of 65 must have an adult accompany them who is not the patient. If the patient needs to stay at the hospital during part of their recovery, the visitor guidelines for inpatient rooms apply.  Please refer to the [facility] website for the visitor guidelines for Inpatients (after your surgery is over and you are in a regular room).    Special instructions:   [place]- Preparing For Surgery  Before surgery, you can play an important role. Because skin is not sterile, your skin needs to be as free of germs as possible. You can reduce the number of germs on your skin by washing with CHG (chlorahexidine gluconate) Soap before surgery.  CHG is an antiseptic cleaner which kills germs and bonds with the skin to continue killing germs even after washing.    Oral Hygiene is also important to reduce your risk of infection.  Remember - BRUSH YOUR TEETH THE MORNING OF SURGERY WITH YOUR REGULAR TOOTHPASTE  Please do not use if you have an allergy to CHG or antibacterial soaps. If your skin becomes reddened/irritated stop using the CHG.  Do not shave (including legs and underarms) for at least 48 hours prior to first CHG shower. It is OK to shave your face.  Please follow these instructions carefully.   Shower the NIGHT BEFORE SURGERY and the MORNING OF SURGERY  If you chose to wash your hair, wash your hair first as usual with  your normal shampoo.  After you shampoo, rinse your hair and body thoroughly to remove the shampoo.  Use CHG Soap as you would any other liquid soap. You can apply CHG directly to the skin and wash gently with a scrungie or a clean washcloth.   Apply the CHG Soap to your body ONLY FROM THE NECK DOWN.  Do not use on open wounds or open sores. Avoid contact with your eyes, ears, mouth and genitals (private parts). Wash Face and genitals (private parts)  with your normal soap.   Wash thoroughly, paying special attention to the area where your surgery will be performed.  Thoroughly rinse your body with warm water from the neck down.  DO NOT shower/wash with your normal soap after using and rinsing off the  CHG Soap.  Pat yourself dry with a CLEAN TOWEL.  Wear CLEAN PAJAMAS to bed the night before surgery  Place CLEAN SHEETS on your bed the night before your surgery  DO NOT SLEEP WITH PETS.   Day of Surgery: Take a shower with CHG soap. Do not wear jewelry. Do not wear lotions, powders, perfumes/colognes, or deodorant. Do not shave 48 hours prior to surgery.  Men may shave face and neck. Wear Clean/Comfortable clothing the morning of surgery Do not bring valuables to the hospital.  [facility] is not responsible for any belongings or valuables. Remember to brush your teeth WITH YOUR REGULAR TOOTHPASTE.   Please read over the following fact sheets that you were given.  If you received a COVID test during your pre-op visit  it is requested that you wear a mask when out in public, stay away from anyone that may not be feeling well and notify your surgeon if you develop symptoms. If you have been in contact with anyone that has tested positive in the last 10 days please notify you surgeon.

## 2022-08-14 NOTE — Progress Notes (Signed)
PCP - Chana Bode, PA Cardiologist - denies  PPM/ICD - denies   Chest x-ray - N/A EKG - 03/07/22 Stress Test - denies ECHO - denies Cardiac Cath - denies  Sleep Study - denies  Fasting Blood Sugar - Pt reports that she checks her blood sugar every 1-2 weeks. Pt states that she is pre-Diabetic. Pt states her blood sugar is normally 118-119. Hgb A1c 6.0 on 06/28/22  Blood Thinner Instructions: N/A Aspirin Instructions:N/A  ERAS Protcol - ERAS + G2  COVID TEST- N/A   Anesthesia review: no  Patient denies shortness of breath, fever, cough and chest pain at PAT appointment   All instructions explained to the patient, with a verbal understanding of the material. Patient agrees to go over the instructions while at home for a better understanding. The opportunity to ask questions was provided.

## 2022-08-20 ENCOUNTER — Other Ambulatory Visit: Payer: Self-pay

## 2022-08-20 ENCOUNTER — Ambulatory Visit (HOSPITAL_BASED_OUTPATIENT_CLINIC_OR_DEPARTMENT_OTHER): Payer: Medicare PPO | Admitting: Registered Nurse

## 2022-08-20 ENCOUNTER — Encounter (HOSPITAL_COMMUNITY): Payer: Self-pay | Admitting: Orthopedic Surgery

## 2022-08-20 ENCOUNTER — Encounter (HOSPITAL_COMMUNITY)
Admission: RE | Disposition: A | Discharge status: Home / Self Care | Payer: Self-pay | Source: Home / Self Care | Attending: Orthopedic Surgery

## 2022-08-20 ENCOUNTER — Ambulatory Visit (HOSPITAL_COMMUNITY): Payer: Medicare PPO | Admitting: Registered Nurse

## 2022-08-20 ENCOUNTER — Ambulatory Visit (HOSPITAL_COMMUNITY)
Admission: RE | Admit: 2022-08-20 | Discharge: 2022-08-20 | Disposition: A | Discharge status: Home / Self Care | Payer: Medicare PPO | Attending: Orthopedic Surgery | Admitting: Orthopedic Surgery

## 2022-08-20 DIAGNOSIS — I739 Peripheral vascular disease, unspecified: Secondary | ICD-10-CM | POA: Diagnosis not present

## 2022-08-20 DIAGNOSIS — F17210 Nicotine dependence, cigarettes, uncomplicated: Secondary | ICD-10-CM | POA: Insufficient documentation

## 2022-08-20 DIAGNOSIS — Z79899 Other long term (current) drug therapy: Secondary | ICD-10-CM | POA: Diagnosis not present

## 2022-08-20 DIAGNOSIS — S43432A Superior glenoid labrum lesion of left shoulder, initial encounter: Secondary | ICD-10-CM | POA: Insufficient documentation

## 2022-08-20 DIAGNOSIS — M659 Synovitis and tenosynovitis, unspecified: Secondary | ICD-10-CM

## 2022-08-20 DIAGNOSIS — M65812 Other synovitis and tenosynovitis, left shoulder: Secondary | ICD-10-CM

## 2022-08-20 DIAGNOSIS — G8918 Other acute postprocedural pain: Secondary | ICD-10-CM | POA: Diagnosis not present

## 2022-08-20 DIAGNOSIS — X58XXXA Exposure to other specified factors, initial encounter: Secondary | ICD-10-CM | POA: Diagnosis not present

## 2022-08-20 DIAGNOSIS — M7522 Bicipital tendinitis, left shoulder: Secondary | ICD-10-CM

## 2022-08-20 DIAGNOSIS — M75102 Unspecified rotator cuff tear or rupture of left shoulder, not specified as traumatic: Secondary | ICD-10-CM | POA: Diagnosis not present

## 2022-08-20 DIAGNOSIS — M75122 Complete rotator cuff tear or rupture of left shoulder, not specified as traumatic: Secondary | ICD-10-CM

## 2022-08-20 DIAGNOSIS — M65912 Unspecified synovitis and tenosynovitis, left shoulder: Secondary | ICD-10-CM

## 2022-08-20 DIAGNOSIS — S46012A Strain of muscle(s) and tendon(s) of the rotator cuff of left shoulder, initial encounter: Secondary | ICD-10-CM | POA: Insufficient documentation

## 2022-08-20 DIAGNOSIS — I10 Essential (primary) hypertension: Secondary | ICD-10-CM | POA: Insufficient documentation

## 2022-08-20 DIAGNOSIS — M810 Age-related osteoporosis without current pathological fracture: Secondary | ICD-10-CM | POA: Insufficient documentation

## 2022-08-20 DIAGNOSIS — Z01818 Encounter for other preprocedural examination: Secondary | ICD-10-CM

## 2022-08-20 DIAGNOSIS — E785 Hyperlipidemia, unspecified: Secondary | ICD-10-CM | POA: Insufficient documentation

## 2022-08-20 HISTORY — PX: SHOULDER ARTHROSCOPY WITH SUBACROMIAL DECOMPRESSION, ROTATOR CUFF REPAIR AND BICEP TENDON REPAIR: SHX5687

## 2022-08-20 SURGERY — SHOULDER ARTHROSCOPY WITH SUBACROMIAL DECOMPRESSION, ROTATOR CUFF REPAIR AND BICEP TENDON REPAIR
Anesthesia: General | Site: Shoulder | Laterality: Left

## 2022-08-20 MED ORDER — PHENYLEPHRINE HCL-NACL 20-0.9 MG/250ML-% IV SOLN
INTRAVENOUS | Status: DC | PRN
  Administered 2022-08-20: 20 ug/min via INTRAVENOUS

## 2022-08-20 MED ORDER — PROPOFOL 10 MG/ML IV BOLUS
INTRAVENOUS | Status: AC
  Filled 2022-08-20: qty 20

## 2022-08-20 MED ORDER — PROPOFOL 10 MG/ML IV BOLUS
INTRAVENOUS | Status: DC | PRN
  Administered 2022-08-20: 120 mg via INTRAVENOUS
  Administered 2022-08-20: 40 mg via INTRAVENOUS

## 2022-08-20 MED ORDER — BUPIVACAINE HCL (PF) 0.5 % IJ SOLN
INTRAMUSCULAR | Status: DC | PRN
  Administered 2022-08-20: 10 mL via PERINEURAL

## 2022-08-20 MED ORDER — EPINEPHRINE PF 1 MG/ML IJ SOLN
INTRAMUSCULAR | Status: AC
  Filled 2022-08-20: qty 3

## 2022-08-20 MED ORDER — PHENYLEPHRINE HCL-NACL 20-0.9 MG/250ML-% IV SOLN
INTRAVENOUS | Status: AC
  Filled 2022-08-20: qty 500

## 2022-08-20 MED ORDER — ONDANSETRON HCL 4 MG/2ML IJ SOLN
INTRAMUSCULAR | Status: AC
  Filled 2022-08-20: qty 2

## 2022-08-20 MED ORDER — BUPIVACAINE LIPOSOME 1.3 % IJ SUSP
INTRAMUSCULAR | Status: DC | PRN
  Administered 2022-08-20: 10 mL via PERINEURAL

## 2022-08-20 MED ORDER — ROCURONIUM BROMIDE 10 MG/ML (PF) SYRINGE
PREFILLED_SYRINGE | INTRAVENOUS | Status: AC
  Filled 2022-08-20: qty 10

## 2022-08-20 MED ORDER — CEFAZOLIN SODIUM-DEXTROSE 2-4 GM/100ML-% IV SOLN
2.0000 g | INTRAVENOUS | Status: AC
  Administered 2022-08-20: 2 g via INTRAVENOUS
  Filled 2022-08-20: qty 100

## 2022-08-20 MED ORDER — OXYCODONE-ACETAMINOPHEN 5-325 MG PO TABS: 1.0000 | ORAL_TABLET | ORAL | 0 refills | Status: DC | PRN

## 2022-08-20 MED ORDER — ACETAMINOPHEN 500 MG PO TABS: 1000.0000 mg | ORAL_TABLET | Freq: Once | ORAL | Status: DC | PRN

## 2022-08-20 MED ORDER — AMISULPRIDE (ANTIEMETIC) 5 MG/2ML IV SOLN
5.0000 mg | Freq: Once | INTRAVENOUS | Status: AC
  Administered 2022-08-20: 5 mg via INTRAVENOUS

## 2022-08-20 MED ORDER — TRANEXAMIC ACID-NACL 1000-0.7 MG/100ML-% IV SOLN
1000.0000 mg | INTRAVENOUS | Status: AC
  Administered 2022-08-20: 1000 mg via INTRAVENOUS
  Filled 2022-08-20: qty 100

## 2022-08-20 MED ORDER — ACETAMINOPHEN 10 MG/ML IV SOLN: 1000.0000 mg | Freq: Once | INTRAVENOUS | Status: DC | PRN

## 2022-08-20 MED ORDER — FENTANYL CITRATE (PF) 100 MCG/2ML IJ SOLN: 25.0000 ug | INTRAMUSCULAR | Status: DC | PRN

## 2022-08-20 MED ORDER — ROCURONIUM BROMIDE 10 MG/ML (PF) SYRINGE
PREFILLED_SYRINGE | INTRAVENOUS | Status: DC | PRN
  Administered 2022-08-20: 10 mg via INTRAVENOUS
  Administered 2022-08-20: 60 mg via INTRAVENOUS
  Administered 2022-08-20: 10 mg via INTRAVENOUS

## 2022-08-20 MED ORDER — LACTATED RINGERS IV SOLN: INTRAVENOUS | Status: DC

## 2022-08-20 MED ORDER — ORAL CARE MOUTH RINSE: 15.0000 mL | Freq: Once | OROMUCOSAL | Status: AC

## 2022-08-20 MED ORDER — PROPOFOL 1000 MG/100ML IV EMUL
INTRAVENOUS | Status: AC
  Filled 2022-08-20: qty 200

## 2022-08-20 MED ORDER — PHENYLEPHRINE 80 MCG/ML (10ML) SYRINGE FOR IV PUSH (FOR BLOOD PRESSURE SUPPORT)
PREFILLED_SYRINGE | INTRAVENOUS | Status: AC
  Filled 2022-08-20: qty 10

## 2022-08-20 MED ORDER — AMISULPRIDE (ANTIEMETIC) 5 MG/2ML IV SOLN
INTRAVENOUS | Status: AC
  Filled 2022-08-20: qty 2

## 2022-08-20 MED ORDER — FENTANYL CITRATE (PF) 100 MCG/2ML IJ SOLN: 50.0000 ug | Freq: Once | INTRAMUSCULAR | Status: AC

## 2022-08-20 MED ORDER — ACETAMINOPHEN 160 MG/5ML PO SOLN: 1000.0000 mg | Freq: Once | ORAL | Status: DC | PRN

## 2022-08-20 MED ORDER — ONDANSETRON HCL 4 MG/2ML IJ SOLN
INTRAMUSCULAR | Status: DC | PRN
  Administered 2022-08-20: 4 mg via INTRAVENOUS

## 2022-08-20 MED ORDER — CHLORHEXIDINE GLUCONATE 0.12 % MT SOLN: 15.0000 mL | Freq: Once | OROMUCOSAL | Status: AC

## 2022-08-20 MED ORDER — DEXAMETHASONE SODIUM PHOSPHATE 10 MG/ML IJ SOLN
INTRAMUSCULAR | Status: AC
  Filled 2022-08-20: qty 1

## 2022-08-20 MED ORDER — ACETAMINOPHEN 10 MG/ML IV SOLN
INTRAVENOUS | Status: AC
  Filled 2022-08-20: qty 100

## 2022-08-20 MED ORDER — ACETAMINOPHEN 10 MG/ML IV SOLN
INTRAVENOUS | Status: DC | PRN
  Administered 2022-08-20: 1000 mg via INTRAVENOUS

## 2022-08-20 MED ORDER — FENTANYL CITRATE (PF) 100 MCG/2ML IJ SOLN
INTRAMUSCULAR | Status: AC
  Administered 2022-08-20: 50 ug via INTRAVENOUS
  Filled 2022-08-20: qty 2

## 2022-08-20 MED ORDER — OXYCODONE HCL 5 MG PO TABS: 5.0000 mg | ORAL_TABLET | Freq: Once | ORAL | Status: DC | PRN

## 2022-08-20 MED ORDER — EPINEPHRINE PF 1 MG/ML IJ SOLN
INTRAMUSCULAR | Status: DC | PRN
  Administered 2022-08-20: 2 mg via SUBCUTANEOUS

## 2022-08-20 MED ORDER — SUGAMMADEX SODIUM 200 MG/2ML IV SOLN
INTRAVENOUS | Status: DC | PRN
  Administered 2022-08-20: 200 mg via INTRAVENOUS

## 2022-08-20 MED ORDER — VANCOMYCIN HCL 1000 MG IV SOLR
INTRAVENOUS | Status: AC
  Filled 2022-08-20: qty 20

## 2022-08-20 MED ORDER — CELECOXIB 100 MG PO CAPS: 100.0000 mg | ORAL_CAPSULE | Freq: Two times a day (BID) | ORAL | 0 refills | Status: DC

## 2022-08-20 MED ORDER — MIDAZOLAM HCL 2 MG/2ML IJ SOLN
INTRAMUSCULAR | Status: AC
  Filled 2022-08-20: qty 2

## 2022-08-20 MED ORDER — EPHEDRINE 5 MG/ML INJ
INTRAVENOUS | Status: AC
  Filled 2022-08-20: qty 5

## 2022-08-20 MED ORDER — METHOCARBAMOL 500 MG PO TABS: 500.0000 mg | ORAL_TABLET | Freq: Three times a day (TID) | ORAL | 1 refills | Status: DC | PRN

## 2022-08-20 MED ORDER — DEXAMETHASONE SODIUM PHOSPHATE 10 MG/ML IJ SOLN
INTRAMUSCULAR | Status: DC | PRN
  Administered 2022-08-20: 5 mg via INTRAVENOUS

## 2022-08-20 MED ORDER — SODIUM CHLORIDE 0.9 % IR SOLN
Status: DC | PRN
  Administered 2022-08-20: 6000 mL

## 2022-08-20 MED ORDER — PHENYLEPHRINE HCL (PRESSORS) 10 MG/ML IV SOLN
INTRAVENOUS | Status: DC | PRN
  Administered 2022-08-20 (×3): 80 ug via INTRAVENOUS

## 2022-08-20 MED ORDER — CHLORHEXIDINE GLUCONATE 0.12 % MT SOLN
OROMUCOSAL | Status: AC
  Administered 2022-08-20: 15 mL via OROMUCOSAL
  Filled 2022-08-20: qty 15

## 2022-08-20 MED ORDER — FENTANYL CITRATE (PF) 250 MCG/5ML IJ SOLN
INTRAMUSCULAR | Status: AC
  Filled 2022-08-20: qty 5

## 2022-08-20 MED ORDER — OXYCODONE HCL 5 MG/5ML PO SOLN: 5.0000 mg | Freq: Once | ORAL | Status: DC | PRN

## 2022-08-20 MED ORDER — FENTANYL CITRATE (PF) 250 MCG/5ML IJ SOLN
INTRAMUSCULAR | Status: DC | PRN
  Administered 2022-08-20 (×2): 50 ug via INTRAVENOUS

## 2022-08-20 SURGICAL SUPPLY — 72 items
AID PSTN UNV HD RSTRNT DISP (MISCELLANEOUS) ×1
ANCH SUT 2 FBRTK KNTLS 1.8 (Anchor) ×1 IMPLANT
ANCH SUT 2 SWLK 19.1 CLS EYLT (Anchor) ×2 IMPLANT
ANCH SUT CRKSW FT 1.3X (Anchor) ×2 IMPLANT
ANCHOR SUT 1.8 FIBERTAK SB KL (Anchor) IMPLANT
ANCHOR SUT BIOCOMP CORKSREW (Anchor) IMPLANT
ANCHOR SWIVELOCK BIO 4.75X19.1 (Anchor) IMPLANT
BAG COUNTER SPONGE SURGICOUNT (BAG) IMPLANT
BAG SPNG CNTER NS LX DISP (BAG)
BLADE EXCALIBUR 4.0X13 (MISCELLANEOUS) IMPLANT
BURR OVAL 8 FLU 4.0X13 (MISCELLANEOUS) IMPLANT
COVER SURGICAL LIGHT HANDLE (MISCELLANEOUS) ×2 IMPLANT
DRAPE INCISE IOBAN 66X45 STRL (DRAPES) ×4 IMPLANT
DRAPE STERI 35X30 U-POUCH (DRAPES) ×2 IMPLANT
DRAPE U-SHAPE 47X51 STRL (DRAPES) ×4 IMPLANT
DRSG TEGADERM 4X10 (GAUZE/BANDAGES/DRESSINGS) IMPLANT
DRSG TEGADERM 4X4.75 (GAUZE/BANDAGES/DRESSINGS) ×6 IMPLANT
DRSG XEROFORM 1X8 (GAUZE/BANDAGES/DRESSINGS) IMPLANT
DURAPREP 26ML APPLICATOR (WOUND CARE) ×2 IMPLANT
DW OUTFLOW CASSETTE/TUBE SET (MISCELLANEOUS) ×2 IMPLANT
ELECT REM PT RETURN 9FT ADLT (ELECTROSURGICAL) ×1
ELECTRODE REM PT RTRN 9FT ADLT (ELECTROSURGICAL) ×2 IMPLANT
GAUZE SPONGE 4X4 12PLY STRL (GAUZE/BANDAGES/DRESSINGS) ×2 IMPLANT
GAUZE SPONGE 4X4 12PLY STRL LF (GAUZE/BANDAGES/DRESSINGS) ×2 IMPLANT
GLOVE BIOGEL PI IND STRL 7.0 (GLOVE) ×2 IMPLANT
GLOVE BIOGEL PI IND STRL 8 (GLOVE) ×2 IMPLANT
GLOVE ECLIPSE 7.0 STRL STRAW (GLOVE) ×2 IMPLANT
GLOVE ECLIPSE 8.0 STRL XLNG CF (GLOVE) ×2 IMPLANT
GOWN STRL REUS W/ TWL LRG LVL3 (GOWN DISPOSABLE) ×6 IMPLANT
GOWN STRL REUS W/TWL LRG LVL3 (GOWN DISPOSABLE) ×3
KIT BASIN OR (CUSTOM PROCEDURE TRAY) ×2 IMPLANT
KIT STR SPEAR 1.8 FBRTK DISP (KITS) IMPLANT
KIT TURNOVER KIT B (KITS) ×2 IMPLANT
MANIFOLD NEPTUNE II (INSTRUMENTS) ×2 IMPLANT
NDL SCORPION MULTI FIRE (NEEDLE) IMPLANT
NDL SPNL 18GX3.5 QUINCKE PK (NEEDLE) ×2 IMPLANT
NDL SUT 6 .5 CRC .975X.05 MAYO (NEEDLE) IMPLANT
NEEDLE MAYO TAPER (NEEDLE)
NEEDLE SCORPION MULTI FIRE (NEEDLE) ×1 IMPLANT
NEEDLE SPNL 18GX3.5 QUINCKE PK (NEEDLE) ×1 IMPLANT
NS IRRIG 1000ML POUR BTL (IV SOLUTION) ×2 IMPLANT
PACK SHOULDER (CUSTOM PROCEDURE TRAY) ×2 IMPLANT
PAD ARMBOARD 7.5X6 YLW CONV (MISCELLANEOUS) ×4 IMPLANT
PROBE APOLLO 90XL (SURGICAL WAND) ×2 IMPLANT
PUSHLOCK PEEK 4.5X24 (Orthopedic Implant) IMPLANT
RESTRAINT HEAD UNIVERSAL NS (MISCELLANEOUS) ×2 IMPLANT
SLING ARM IMMOBILIZER LRG (SOFTGOODS) IMPLANT
SPONGE LAP 18X18 X RAY DECT (DISPOSABLE) IMPLANT
SPONGE T-LAP 4X18 ~~LOC~~+RFID (SPONGE) ×4 IMPLANT
STRIP CLOSURE SKIN 1/2X4 (GAUZE/BANDAGES/DRESSINGS) ×2 IMPLANT
SUCTION FRAZIER HANDLE 10FR (MISCELLANEOUS) ×1
SUCTION TUBE FRAZIER 10FR DISP (MISCELLANEOUS) ×2 IMPLANT
SUT ETHILON 3 0 PS 1 (SUTURE) ×2 IMPLANT
SUT FIBERWIRE #2 38 T-5 BLUE (SUTURE)
SUT MNCRL AB 3-0 PS2 18 (SUTURE) ×2 IMPLANT
SUT VIC AB 0 CT1 27 (SUTURE) ×1
SUT VIC AB 0 CT1 27XBRD ANBCTR (SUTURE) ×2 IMPLANT
SUT VIC AB 1 CT1 27 (SUTURE)
SUT VIC AB 1 CT1 27XBRD ANBCTR (SUTURE) IMPLANT
SUT VIC AB 2-0 CT1 27 (SUTURE) ×1
SUT VIC AB 2-0 CT1 TAPERPNT 27 (SUTURE) ×2 IMPLANT
SUT VICRYL 0 UR6 27IN ABS (SUTURE) ×2 IMPLANT
SUT VICRYL 1 TIES 12X18 (SUTURE) ×2 IMPLANT
SUTURE FIBERWR #2 38 T-5 BLUE (SUTURE) IMPLANT
SUTURE TAPE 1.3 FIBERLOP 20 ST (SUTURE) IMPLANT
SUTURETAPE 1.3 FIBERLOOP 20 ST (SUTURE)
SYS FBRTK BUTTON 2.6 (Anchor) ×1 IMPLANT
SYSTEM FBRTK BUTTON 2.6 (Anchor) IMPLANT
TOWEL GREEN STERILE (TOWEL DISPOSABLE) ×2 IMPLANT
TOWEL GREEN STERILE FF (TOWEL DISPOSABLE) ×2 IMPLANT
TUBING ARTHROSCOPY IRRIG 16FT (MISCELLANEOUS) ×2 IMPLANT
WATER STERILE IRR 1000ML POUR (IV SOLUTION) ×2 IMPLANT

## 2022-08-20 NOTE — H&P (Signed)
Alyssa Carter is an 76 y.o. female.   Chief Complaint: Left shoulder pain HPI:  Alyssa Carter is a 76 y.o. female with  left shoulder pain.  Been going on for months.  Started after lifting some groceries 2 months ago.  Denied any bruising at that time.  She did have successful rotator cuff surgery done about 4 years ago.  She is right-hand dominant.  Lives alone.  She is a long-term smoker and has a history of osteoporosis.  Shoulder is not getting any better.  Denies any superior AC joint tenderness.  MRI scan is reviewed.  Shows supraspinatus tendon full-thickness tear with retraction and mild atrophy.  There is also full-thickness tear of the infraspinatus with mild atrophy and some retraction.  Also intracapsular tendinosis of the long head of the biceps tendon.  mri  scan also shows significant AC joint arthritis..     Past Medical History:  Diagnosis Date   Bilateral leg pain    Dyslipidemia    Full dentures    H/O bone density study 04/2013   never   H/O mammogram 2010   Hematuria    microscopic, several prior evaluations, no source or cause found   Hyperlipidemia    Hypertension    Impaired fasting blood sugar 2008   Influenza vaccine side effect    intolerance, declines   Pre-diabetes    Tobacco use    Wears glasses     Past Surgical History:  Procedure Laterality Date   ABDOMINAL HYSTERECTOMY  1980   total; due to heavy bleeding   APPENDECTOMY  1980   KNEE SURGERY     right   SHOULDER SURGERY Right    rotator cuff   SPINE SURGERY     lumbar   TONSILLECTOMY     TUBAL LIGATION      Family History  Problem Relation Age of Onset   Alzheimer's disease Mother    Other Mother        died of UTI, dehydration   Other Father 22       failure to thrive, old age, fall and rib fracture   Diabetes Father    Diabetes Sister    Cancer Brother        brain, stomach   Colon cancer Neg Hx    Social History:  reports that she has been smoking cigarettes. She has a  20.00 pack-year smoking history. She has never used smokeless tobacco. She reports that she does not drink alcohol and does not use drugs.  Allergies:  Allergies  Allergen Reactions   Aspirin     constipation    Medications Prior to Admission  Medication Sig Dispense Refill   alendronate (FOSAMAX) 70 MG tablet Take 1 tablet (70 mg total) by mouth every 7 (seven) days. Take with a full glass of water on an empty stomach. 12 tablet 3   Cholecalciferol (VITAMIN D) 50 MCG (2000 UT) CAPS Take 1 capsule (2,000 Units total) by mouth daily. 90 capsule 3   gabapentin (NEURONTIN) 300 MG capsule Take 2 capsules (600 mg total) by mouth at bedtime. 180 capsule 4   lisinopril-hydrochlorothiazide (ZESTORETIC) 20-12.5 MG tablet TAKE 1 TABLET EVERY DAY 90 tablet 1   Multiple Vitamin (MULTIVITAMIN WITH MINERALS) TABS tablet Take 1 tablet by mouth daily.     Omega-3 Fatty Acids (FISH OIL PO) Take 2 capsules by mouth daily.     rosuvastatin (CRESTOR) 40 MG tablet TAKE 1 TABLET EVERY DAY 90 tablet 1  No results found for this or any previous visit (from the past 48 hour(s)). No results found.  Review of Systems  Musculoskeletal:  Positive for arthralgias.    Blood pressure (!) 156/64, pulse 73, temperature 98 F (36.7 C), temperature source Oral, resp. rate 18, height '5\' 3"'$  (1.6 m), weight 76.2 kg, SpO2 99 %. Physical Exam Vitals reviewed.  HENT:     Head: Normocephalic.     Nose: Nose normal.     Mouth/Throat:     Mouth: Mucous membranes are moist.  Eyes:     Pupils: Pupils are equal, round, and reactive to light.  Cardiovascular:     Rate and Rhythm: Normal rate.     Pulses: Normal pulses.  Pulmonary:     Effort: Pulmonary effort is normal.  Abdominal:     General: Abdomen is flat.  Musculoskeletal:     Cervical back: Normal range of motion.  Skin:    General: Skin is warm.     Capillary Refill: Capillary refill takes less than 2 seconds.  Neurological:     General: No focal deficit  present.     Mental Status: She is alert.  Psychiatric:        Mood and Affect: Mood normal.    Ortho exam demonstrates no tenderness at the [facility] [place] [facility] joint on the left compared to right. She does have some weakness to supraspinatus and infraspinatus testing on the left subscap strength is intact. No Popeye deformity present. Passive range of motion is 50/90/150. Active range of motion remains below shoulder level to just below 90 degrees of forward flexion and abduction. She does have some grinding and crepitus with internal/external rotation of that left arm at 90 degrees of abduction.   Assessment/Plan  Impression is left shoulder rotator cuff tear with mild retraction of the tendons.  Patient has AC joint arthritis but no discrete tenderness of the AC joint at this time.  She may have an early frozen shoulder.  Difficulties with surgical fixation in this patient is related to her long-term smoking use as well as osteoporosis.  That could make fixation difficult.  Nonetheless based on her successful outcome from 4 years ago and her reluctance to consider reverse replacement which is not really indicated at this time unless she does fail rotator cuff repair I think an attempt at rotator cuff repair is indicated.  We will have to protect the repair slightly longer than for most people based on her medical comorbidities.  Risk and benefits are discussed with the patient including not limited to infection or vessel damage incomplete healing nonhealing and potential need for revision surgery which in her case would likely be reverse shoulder replacement.  Patient understands risk benefits.  All questions answered.    Anderson Malta, MD 08/20/2022, 4:27 PM

## 2022-08-20 NOTE — Anesthesia Procedure Notes (Signed)
Procedure Name: Intubation Date/Time: 08/20/2022 4:51 PM  Performed by: Moshe Salisbury, CRNAPre-anesthesia Checklist: Patient identified, Emergency Drugs available, Suction available and Patient being monitored Patient Re-evaluated:Patient Re-evaluated prior to induction Oxygen Delivery Method: Circle System Utilized Preoxygenation: Pre-oxygenation with 100% oxygen Induction Type: IV induction Ventilation: Mask ventilation without difficulty Laryngoscope Size: Mac and 4 Grade View: Grade I Tube type: Oral Tube size: 7.0 mm Number of attempts: 2 Airway Equipment and Method: Stylet and Oral airway Placement Confirmation: ETT inserted through vocal cords under direct vision, positive ETCO2 and breath sounds checked- equal and bilateral Secured at: 22 cm Tube secured with: Tape Dental Injury: Teeth and Oropharynx as per pre-operative assessment

## 2022-08-20 NOTE — Anesthesia Postprocedure Evaluation (Signed)
Anesthesia Post Note  Patient: SHELAH HEATLEY  Procedure(s) Performed: left shoulder arthroscopy, debridement, biceps tenodesis, mini open rotator cuff repair (Left: Shoulder)     Patient location during evaluation: PACU Anesthesia Type: General and Regional Level of consciousness: awake and alert Pain management: pain level controlled Vital Signs Assessment: post-procedure vital signs reviewed and stable Respiratory status: spontaneous breathing, nonlabored ventilation, respiratory function stable and patient connected to nasal cannula oxygen Cardiovascular status: blood pressure returned to baseline and stable Postop Assessment: no apparent nausea or vomiting Anesthetic complications: no  No notable events documented.  Last Vitals:  Vitals:   08/20/22 2015 08/20/22 2030  BP: 135/61   Pulse: 76 74  Resp: 18 18  Temp: (!) 36.4 C   SpO2: 93% 92%    Last Pain:  Vitals:   08/20/22 2015  TempSrc:   PainSc: 0-No pain                 Kashawn Dirr,W. EDMOND

## 2022-08-20 NOTE — Anesthesia Preprocedure Evaluation (Addendum)
Anesthesia Evaluation  Patient identified by MRN, date of birth, ID band Patient awake    Reviewed: Allergy & Precautions, NPO status , Patient's Chart, lab work & pertinent test results  Airway Mallampati: II  TM Distance: >3 FB Neck ROM: Full    Dental  (+) Edentulous Upper, Edentulous Lower, Dental Advisory Given   Pulmonary neg pulmonary ROS, Current Smoker and Patient abstained from smoking.   Pulmonary exam normal breath sounds clear to auscultation       Cardiovascular hypertension, + Peripheral Vascular Disease  Normal cardiovascular exam Rhythm:Regular Rate:Normal   SUMMARY   -  Overall left ventricular systolic function was normal. Left         ventricular ejection fraction was estimated , range being 55         % to 65 %. Although no diagnostic left ventricular regional         wall motion abnormality was identified, this possibility         cannot be completely excluded on the basis of this study.   IMPRESSIONS   -  No likely, discrete, intracardiac source of emboli was apparent.         The study would have fair sensitivity for such an         abnormality.    --------------------     Neuro/Psych negative neurological ROS     GI/Hepatic negative GI ROS, Neg liver ROS,,,  Endo/Other  negative endocrine ROS    Renal/GU Renal InsufficiencyRenal diseasenegative Renal ROSLab Results      Component                Value               Date                      CREATININE               1.11 (H)            08/14/2022                Musculoskeletal negative musculoskeletal ROS (+)    Abdominal   Peds  Hematology negative hematology ROS (+) Lab Results      Component                Value               Date                      WBC                      6.7                 08/14/2022                HGB                      14.3                08/14/2022                HCT                      42.3                 08/14/2022  MCV                      94.6                08/14/2022                PLT                      298                 08/14/2022              Anesthesia Other Findings   Reproductive/Obstetrics                             Anesthesia Physical Anesthesia Plan  ASA: 2  Anesthesia Plan: General   Post-op Pain Management: Ofirmev IV (intra-op)* and Regional block*   Induction: Intravenous  PONV Risk Score and Plan: 3 and Ondansetron, Dexamethasone and Treatment may vary due to age or medical condition  Airway Management Planned: Oral ETT  Additional Equipment:   Intra-op Plan:   Post-operative Plan: Extubation in OR  Informed Consent: I have reviewed the patients History and Physical, chart, labs and discussed the procedure including the risks, benefits and alternatives for the proposed anesthesia with the patient or authorized representative who has indicated his/her understanding and acceptance.     Dental advisory given  Plan Discussed with: CRNA  Anesthesia Plan Comments:        Anesthesia Quick Evaluation

## 2022-08-20 NOTE — Anesthesia Procedure Notes (Signed)
Anesthesia Regional Block: Interscalene brachial plexus block   Pre-Anesthetic Checklist: , timeout performed,  Correct Patient, Correct Site, Correct Laterality,  Correct Procedure, Correct Position, site marked,  Risks and benefits discussed,  Surgical consent,  Pre-op evaluation,  At surgeon's request and post-op pain management  Laterality: Upper and Left  Prep: chloraprep       Needles:  Injection technique: Single-shot  Needle Type: Stimulator Needle - 40     Needle Length: 4cm  Needle Gauge: 22     Additional Needles:   Procedures:,,,, ultrasound used (permanent image in chart),,    Narrative:  Start time: 08/20/2022 4:09 PM End time: 08/20/2022 4:29 PM Injection made incrementally with aspirations every 5 mL.  Performed by: Personally  Anesthesiologist: Nolon Nations, MD  Additional Notes: BP cuff, SpO2 and EKG monitors applied. Sedation begun. Nerve location verified with ultrasound. Anesthetic injected incrementally, slowly, and after neg aspirations under direct u/s guidance. Good perineural spread. Tolerated well.

## 2022-08-20 NOTE — Op Note (Unsigned)
NAME: Alyssa Carter, HUSER MEDICAL RECORD NO: [id number] ACCOUNT NO: [id number] DATE OF BIRTH: 12-04-1945 FACILITY: [facility] LOCATION: [facility] PHYSICIAN: Yetta Barre. Marlou Sa, MD  Operative Report   PREOPERATIVE DIAGNOSES:  Left shoulder rotator cuff tear, biceps tendinitis and SLAP tear.  POSTOPERATIVE DIAGNOSES:  Left shoulder rotator cuff tear of the supraspinatus, and posterior aspect of the infraspinatus along with biceps tendinitis, SLAP tear and early synovitis within the shoulder.  PROCEDURE:  Left shoulder diagnostic arthroscopy with limited debridement of the superior labrum and synovitis within the rotator interval with subsequent subacromial decompression, mini open rotator cuff tear and biceps tenodesis.  SURGEON:  Yetta Barre. Marlou Sa, MD  ASSISTANT:  Annie Main, PA.  INDICATIONS:  The patient is a 76 year old patient with left shoulder pain who presents for operative management after explanation of risks and benefits.  PROCEDURE IN DETAIL:  The patient was brought to the operating room where general anesthetic was induced.  Preoperative antibiotics administered.  Timeout was called.  Left shoulder examined under anesthesia and found to have range of motion of  45/85/155.  Shoulder did not feel to be frozen.  Following examination under anesthesia, left shoulder prescrubbed with alcohol and Betadine, allowed to air dry.  Prepped with DuraPrep solution and draped in sterile manner.  Ioban used to seal the  operative field and cover the axilla.  Timeout was called.  Posterior portal created.  Anterior portal created under direct visualization.  Diagnostic arthroscopy was performed.  The patient had torn rotator cuff, supraspinatus, and part of the  infraspinatus.  Glenohumeral articular surfaces were intact.  Anterior inferior, posterior inferior glenohumeral ligaments intact.  The patient did have a degenerative SLAP tear along with synovitis within the rotator interval.  This was debrided  with  the Arthrocare wand placed through the superior portal.  The labrum was debrided from the 10 o'clock to 1 o'clock position after releasing the biceps tendon.  Following this, the instruments were removed.  Portals were closed.  Ioban then used to cover  the entire operative field.  Incision made off the anterolateral margin of the acromion and at measured distance of 4 cm, a stay suture was placed between the anterior and middle raphae.  That raphae was then opened and self-retaining retractor placed.   The patient then underwent biceps tenodesis of the biceps tendon under appropriate tension.  This was done using FiberLink suture in the biceps tendon followed by a knotless SutureTak in the distal aspect of the bicipital groove and another knotless  SutureTak in the proximal aspect.  Secured fixation under appropriate tension was achieved.  Next, attention was directed towards the rotator cuff tear.  Acromioplasty performed with a rasp. Devitalized appearing tendon was debrided.  A 6-0 Vicryl  grasping sutures were placed in leading edge of the tendon tear.  Next, the footprint was prepared with a rasp.  Bone quality was good.  Two corkscrew anchors were placed at the junction of the articular surface and the tuberosity.  The SutureTapes were  then passed posterior to anterior using a Scorpion x8 suture passes.  Next, the SutureTapes were tied and rotator cuff reduced. The SutureTapes were then crossed and matched with a 3 Vicryl sutures posteriorly and 3 Vicryl sutures anteriorly.  These were  then placed into a [place] with the arm adducted.  Watertight repair was achieved.  Thorough irrigation then performed.  Deltoid split closed using #1 Vicryl suture followed by interrupted inverted 0 Vicryl suture, 2-0 Vicryl suture, 3-0 Monocryl,  Steri-Strips and  impervious dressings.  The patient tolerated the procedure well without immediate complications, transferred to recovery room in stable condition.   Luke's assistance was required at all times for retraction, opening, closing,  mobilization of tissue.  His assistance was a medical necessity for the entire case.   NIK D: 08/20/2022 7:12:40 pm T: 08/20/2022 8:56:00 pm  JOB: [id number]/ [id number]

## 2022-08-20 NOTE — Brief Op Note (Signed)
   08/20/2022  7:07 PM  PATIENT:  Alyssa Carter  76 y.o. female  PRE-OPERATIVE DIAGNOSIS:  left shoulder rotator cuff tear, biceps tendonitis  POST-OPERATIVE DIAGNOSIS:  left shoulder rotator cuff tear, biceps tendonitis  PROCEDURE:  Procedure(s): left shoulder arthroscopy, debridement, biceps tenodesis, mini open rotator cuff repair  SURGEON:  Surgeon(s): Marlou Sa Tonna Corner, MD  ASSISTANT: Annie Main, PA  ANESTHESIA:   General  EBL: 25 ml    No intake/output data recorded.  BLOOD ADMINISTERED: none  DRAINS: None  LOCAL MEDICATIONS USED:  none  SPECIMEN:  No Specimen  COUNTS:  YES  TOURNIQUET:  * No tourniquets in log *  DICTATION: .Other Dictation: Dictation Number [id number]  PLAN OF CARE: Discharge to home after PACU  PATIENT DISPOSITION:  PACU - hemodynamically stable

## 2022-08-20 NOTE — Transfer of Care (Signed)
Immediate Anesthesia Transfer of Care Note  Patient: Alyssa Carter  Procedure(s) Performed: left shoulder arthroscopy, debridement, biceps tenodesis, mini open rotator cuff repair (Left: Shoulder)  Patient Location: PACU  Anesthesia Type:General and Regional  Level of Consciousness: awake and alert   Airway & Oxygen Therapy: Patient Spontanous Breathing and Patient connected to face mask oxygen  Post-op Assessment: Report given to RN and Post -op Vital signs reviewed and stable  Post vital signs: Reviewed and stable  Last Vitals:  Vitals Value Taken Time  BP 155/68 08/20/22 1930  Temp    Pulse 81 08/20/22 1930  Resp 17 08/20/22 1930  SpO2 93 % 08/20/22 1930  Vitals shown include unvalidated device data.  Last Pain:  Vitals:   08/20/22 1635  TempSrc:   PainSc: 0-No pain      Patients Stated Pain Goal: 2 (92/95/74 7340)  Complications: No notable events documented.

## 2022-08-21 ENCOUNTER — Telehealth: Payer: Self-pay | Admitting: Orthopedic Surgery

## 2022-08-21 ENCOUNTER — Encounter (HOSPITAL_COMMUNITY): Payer: Self-pay | Admitting: Orthopedic Surgery

## 2022-08-21 NOTE — Telephone Encounter (Signed)
Pt's daughter called stating pt had surgery yesterday and did not get a CPM machine as stated on her discharge paperwork. Please call pt's daughter Jenny Reichmann at [phone]

## 2022-08-22 DIAGNOSIS — M75112 Incomplete rotator cuff tear or rupture of left shoulder, not specified as traumatic: Secondary | ICD-10-CM | POA: Diagnosis not present

## 2022-08-25 DIAGNOSIS — M75122 Complete rotator cuff tear or rupture of left shoulder, not specified as traumatic: Secondary | ICD-10-CM

## 2022-08-25 DIAGNOSIS — M65812 Other synovitis and tenosynovitis, left shoulder: Secondary | ICD-10-CM

## 2022-08-25 DIAGNOSIS — M7522 Bicipital tendinitis, left shoulder: Secondary | ICD-10-CM

## 2022-08-28 ENCOUNTER — Ambulatory Visit (INDEPENDENT_AMBULATORY_CARE_PROVIDER_SITE_OTHER): Payer: Medicare PPO | Admitting: Surgical

## 2022-08-28 DIAGNOSIS — Z9889 Other specified postprocedural states: Secondary | ICD-10-CM

## 2022-08-31 ENCOUNTER — Encounter: Payer: Self-pay | Admitting: Surgical

## 2022-08-31 NOTE — Progress Notes (Signed)
Post-Op Visit Note   Patient: Alyssa Carter           Date of Birth: 08/27/1946           MRN: [id number] Visit Date: 08/28/2022 PCP: Carlena Hurl, PA-C   Assessment & Plan:  Chief Complaint:  Chief Complaint  Patient presents with   Left Shoulder - Routine Post Op    L SHOULDER SCOPE (surgery date 08-20-22)   Visit Diagnoses: No diagnosis found.  Plan: Alyssa Carter is a 76 y.o. female who presents s/p left shoulder rotator cuff repair and biceps tenodesis on 08/20/2022.  Patient is doing well and pain is overall controlled.  \Denies any chest pain, SOB, fevers, chills.  She states she really has not had any pain aside from some discomfort.  She is taking Tylenol for pain control.  On exam, patient has range of motion 25 degrees external rotation, 70 degrees abduction, 90 degrees forward flexion..  Intact EPL, FPL, finger abduction, finger adduction, pronation/supination, bicep, tricep, deltoid of operative extremity.  Axillary nerve intact with deltoid firing.  Incisions are healing well without evidence of infection or dehiscence.  Sutures removed and replaced with Steri-Strips today.  2+ radial pulse of the operative extremity  Plan is continue with CPM machine.  No lifting with the operative extremity or any active range of motion of the operative shoulder.  Follow-up in 2 weeks for clinical recheck on range of motion and initiation of physical therapy at that time..   Follow-Up Instructions: No follow-ups on file.   Orders:  No orders of the defined types were placed in this encounter.  No orders of the defined types were placed in this encounter.   Imaging: No results found.  PMFS History: Patient Active Problem List   Diagnosis Date Noted   Biceps tendonitis, left 08/25/2022   Nontraumatic complete tear of left rotator cuff 08/25/2022   Synovitis of left shoulder 08/25/2022   Rotator cuff tear arthropathy of left shoulder 07/10/2022   Former smoker  06/28/2022   Acute pain of left shoulder 06/28/2022   Screening for hematuria or proteinuria 06/28/2022   Screening for heart disease 06/28/2022   Encounter for screening mammogram for malignant neoplasm of breast 06/28/2022   Needs flu shot 06/28/2022   Bunion of right foot 03/28/2021   Flat feet 03/28/2021   Bunion 03/28/2021   Radiculopathy, lumbar region 10/19/2020   Right cervical radiculopathy 10/19/2020   Muscle cramp 08/30/2020   Right leg paresthesias 08/08/2020   Phlebitis 07/07/2020   Right leg pain 07/07/2020   Screening for lung cancer 07/07/2020   Medicare annual wellness visit, subsequent 05/25/2020   Neuropathy 05/25/2020   Vaccine counseling 05/25/2020   Leg cramping 05/25/2020   Varicose veins of both lower extremities 05/25/2020   Spider veins 05/25/2020   Ringing in ear, bilateral 05/25/2020   Impaired fasting blood sugar 08/20/2019   Vitamin D deficiency 08/20/2019   S/P hysterectomy 08/20/2019   Osteoporosis without current pathological fracture 08/27/2017   Encounter for health maintenance examination in adult 01/08/2017   Estrogen deficiency 01/08/2017   PVD (peripheral vascular disease) ([place]) 01/08/2017   Chronic right shoulder pain 01/08/2017   Paresthesia of both lower extremities 05/22/2015   Essential hypertension 05/22/2015   Mixed dyslipidemia 05/22/2015   History of back surgery 05/22/2015   Advance directive discussed with patient 05/22/2015   Past Medical History:  Diagnosis Date   Bilateral leg pain    Dyslipidemia    Full  dentures    H/O bone density study 04/2013   never   H/O mammogram 2010   Hematuria    microscopic, several prior evaluations, no source or cause found   Hyperlipidemia    Hypertension    Impaired fasting blood sugar 2008   Influenza vaccine side effect    intolerance, declines   Pre-diabetes    Tobacco use    Wears glasses     Family History  Problem Relation Age of Onset   Alzheimer's disease Mother     Other Mother        died of UTI, dehydration   Other Father 82       failure to thrive, old age, fall and rib fracture   Diabetes Father    Diabetes Sister    Cancer Brother        brain, stomach   Colon cancer Neg Hx     Past Surgical History:  Procedure Laterality Date   ABDOMINAL HYSTERECTOMY  1980   total; due to heavy bleeding   APPENDECTOMY  1980   KNEE SURGERY     right   SHOULDER ARTHROSCOPY WITH SUBACROMIAL DECOMPRESSION, ROTATOR CUFF REPAIR AND BICEP TENDON REPAIR Left 08/20/2022   Procedure: left shoulder arthroscopy, debridement, biceps tenodesis, mini open rotator cuff repair;  Surgeon: Meredith Pel, MD;  Location: [facility];  Service: Orthopedics;  Laterality: Left;   SHOULDER SURGERY Right    rotator cuff   SPINE SURGERY     lumbar   TONSILLECTOMY     TUBAL LIGATION     Social History   Occupational History   Occupation: Receptionist - part time  Tobacco Use   Smoking status: Every Day    Packs/day: 0.50    Years: 40.00    Total pack years: 20.00    Types: Cigarettes   Smokeless tobacco: Never   Tobacco comments:    Smokes off and on, "I can go months and not smoke"  Vaping Use   Vaping Use: Never used  Substance and Sexual Activity   Alcohol use: No   Drug use: No   Sexual activity: Not on file

## 2022-09-13 ENCOUNTER — Ambulatory Visit (INDEPENDENT_AMBULATORY_CARE_PROVIDER_SITE_OTHER): Payer: Medicare PPO | Admitting: Surgical

## 2022-09-13 DIAGNOSIS — Z9889 Other specified postprocedural states: Secondary | ICD-10-CM

## 2022-09-15 ENCOUNTER — Encounter: Payer: Self-pay | Admitting: Surgical

## 2022-09-15 NOTE — Progress Notes (Signed)
Post-Op Visit Note   Patient: Alyssa Carter           Date of Birth: 07/15/1946           MRN: [id number] Visit Date: 09/13/2022 PCP: Carlena Hurl, PA-C   Assessment & Plan:  Chief Complaint:  Chief Complaint  Patient presents with   Left Shoulder - Routine Post Op   Visit Diagnoses: No diagnosis found.  Plan: Patient is a 77 year old female who presents s/p left shoulder rotator cuff repair and bicep tenodesis on 08/20/2022.  She is about 3 and half weeks out from procedure.  She has been compliant with wearing sling but she does state that she has been trying to work on some active range of motion against advice.  She states that she really only has discomfort with the shoulder when she is actively lifting it under its own power.  Aside from that, pain is doing very well.  She is sleeping okay at night.  Denies any fevers or chills or drainage from the incisions.  Not really having to take any significant amount of medication to control pain.  On exam, patient has 40 degrees X rotation, 75 degrees abduction, 140 degrees forward elevation passively.  She has incisions that are healing well without evidence of infection or dehiscence.  Axillary nerve is intact with deltoid firing.  Does have a very small amount of crepitus noted directly over the incision with passive motion of the shoulder but she does not really have discomfort with doing this.  She has 2+ radial pulse of the operative extremity.  Decent rotator cuff strength of supra, infra, subscap with gentle testing today.  Plan is to continue with only working on passive range of motion and active range of motion with CPM machine.  She does not want to do any sort of formal physical therapy so exercise band was given to her and she will start full active range of motion of the left shoulder in 2 weeks when she is approaching the 6-week mark out from surgery.  A week after that, she may start exercise program consisting of  light exercise band work, wall crawls, light row exercises.  She will follow-up with Dr. Marlou Sa about a week after she starts strengthening exercises around 4 weeks from now.  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  This would not involve any lifting.  She was discouraged against lifting anything with the operative arm still.  Follow-Up Instructions: No follow-ups on file.   Orders:  No orders of the defined types were placed in this encounter.  No orders of the defined types were placed in this encounter.   Imaging: No results found.  PMFS History: Patient Active Problem List   Diagnosis Date Noted   Biceps tendonitis, left 08/25/2022   Nontraumatic complete tear of left rotator cuff 08/25/2022   Synovitis of left shoulder 08/25/2022   Rotator cuff tear arthropathy of left shoulder 07/10/2022   Former smoker 06/28/2022   Acute pain of left shoulder 06/28/2022   Screening for hematuria or proteinuria 06/28/2022   Screening for heart disease 06/28/2022   Encounter for screening mammogram for malignant neoplasm of breast 06/28/2022   Needs flu shot 06/28/2022   Bunion of right foot 03/28/2021   Flat feet 03/28/2021   Bunion 03/28/2021   Radiculopathy, lumbar region 10/19/2020   Right cervical radiculopathy 10/19/2020   Muscle  cramp 08/30/2020   Right leg paresthesias 08/08/2020   Phlebitis 07/07/2020   Right leg pain 07/07/2020   Screening for lung cancer 07/07/2020   Medicare annual wellness visit, subsequent 05/25/2020   Neuropathy 05/25/2020   Vaccine counseling 05/25/2020   Leg cramping 05/25/2020   Varicose veins of both lower extremities 05/25/2020   Spider veins 05/25/2020   Ringing in ear, bilateral 05/25/2020   Impaired fasting blood sugar 08/20/2019   Vitamin D deficiency 08/20/2019   S/P hysterectomy 08/20/2019   Osteoporosis without current pathological fracture 08/27/2017   Encounter  for health maintenance examination in adult 01/08/2017   Estrogen deficiency 01/08/2017   PVD (peripheral vascular disease) ([place]) 01/08/2017   Chronic right shoulder pain 01/08/2017   Paresthesia of both lower extremities 05/22/2015   Essential hypertension 05/22/2015   Mixed dyslipidemia 05/22/2015   History of back surgery 05/22/2015   Advance directive discussed with patient 05/22/2015   Past Medical History:  Diagnosis Date   Bilateral leg pain    Dyslipidemia    Full dentures    H/O bone density study 04/2013   never   H/O mammogram 2010   Hematuria    microscopic, several prior evaluations, no source or cause found   Hyperlipidemia    Hypertension    Impaired fasting blood sugar 2008   Influenza vaccine side effect    intolerance, declines   Pre-diabetes    Tobacco use    Wears glasses     Family History  Problem Relation Age of Onset   Alzheimer's disease Mother    Other Mother        died of UTI, dehydration   Other Father 72       failure to thrive, old age, fall and rib fracture   Diabetes Father    Diabetes Sister    Cancer Brother        brain, stomach   Colon cancer Neg Hx     Past Surgical History:  Procedure Laterality Date   ABDOMINAL HYSTERECTOMY  1980   total; due to heavy bleeding   APPENDECTOMY  1980   KNEE SURGERY     right   SHOULDER ARTHROSCOPY WITH SUBACROMIAL DECOMPRESSION, ROTATOR CUFF REPAIR AND BICEP TENDON REPAIR Left 08/20/2022   Procedure: left shoulder arthroscopy, debridement, biceps tenodesis, mini open rotator cuff repair;  Surgeon: Meredith Pel, MD;  Location: [facility];  Service: Orthopedics;  Laterality: Left;   SHOULDER SURGERY Right    rotator cuff   SPINE SURGERY     lumbar   TONSILLECTOMY     TUBAL LIGATION     Social History   Occupational History   Occupation: Receptionist - part time  Tobacco Use   Smoking status: Every Day    Packs/day: 0.50    Years: 40.00    Total pack years: 20.00    Types:  Cigarettes   Smokeless tobacco: Never   Tobacco comments:    Smokes off and on, "I can go months and not smoke"  Vaping Use   Vaping Use: Never used  Substance and Sexual Activity   Alcohol use: No   Drug use: No   Sexual activity: Not on file

## 2022-09-25 ENCOUNTER — Encounter: Payer: Medicare PPO | Admitting: Orthopedic Surgery

## 2022-10-01 ENCOUNTER — Ambulatory Visit (INDEPENDENT_AMBULATORY_CARE_PROVIDER_SITE_OTHER): Payer: Medicare PPO

## 2022-10-01 VITALS — Ht 62.0 in | Wt 168.0 lb

## 2022-10-01 DIAGNOSIS — Z0000 Encounter for general adult medical examination without abnormal findings: Secondary | ICD-10-CM

## 2022-10-01 NOTE — Progress Notes (Signed)
Virtual Visit via Telephone Note  I connected with  Alyssa Carter on 10/01/22 at  9:15 AM EST by telephone and verified that I am speaking with the correct person using two identifiers.  Location: Patient: home Provider: [facility] Persons participating in the virtual visit: patient/Nurse Health Advisor   I discussed the limitations, risks, security and privacy concerns of performing an evaluation and management service by telephone and the availability of in person appointments. The patient expressed understanding and agreed to proceed.  Interactive audio and video telecommunications were attempted between this nurse and patient, however failed, due to patient having technical difficulties OR patient did not have access to video capability.  We continued and completed visit with audio only.  Some vital signs may be absent or patient reported.   Roger Shelter, LPN  Subjective:   Alyssa Carter is a 77 y.o. female who presents for Medicare Annual (Subsequent) preventive examination.  Review of Systems     Cardiac Risk Factors include: advanced age (>49mn, >>49women);obesity (BMI >30kg/m2);smoking/ tobacco exposure     Objective:    Today's Vitals   10/01/22 0917  Weight: 168 lb (76.2 kg)  Height: '5\' 2"'$  (1.575 m)   Body mass index is 30.73 kg/m.     10/01/2022    9:29 AM 08/14/2022    2:04 PM 09/28/2021    2:18 PM 05/25/2020    9:35 AM 12/04/2017   11:03 AM 04/01/2016    9:05 AM 01/17/2016    2:23 PM  Advanced Directives  Does Patient Have a Medical Advance Directive? Yes Yes Yes Yes Yes Yes No  Type of Advance Directive Living will;Healthcare Power of [place] Living will;Healthcare [place] of [place]  Does patient want to make changes to medical advance directive? Yes (MAU/Ambulatory/Procedural Areas - Information given)    No -  Patient declined    Copy of [place]? Yes - validated most recent copy scanned in chart (See row information) Yes - validated most recent copy scanned in chart (See row information) Yes - validated most recent copy scanned in chart (See row information)  No - copy requested    Would patient like information on creating a medical advance directive?       No - patient declined information    Current Medications (verified) Outpatient Encounter Medications as of 10/01/2022  Medication Sig   celecoxib (CELEBREX) 100 MG capsule Take 1 capsule (100 mg total) by mouth 2 (two) times daily.   Cholecalciferol (VITAMIN D) 50 MCG (2000 UT) CAPS Take 1 capsule (2,000 Units total) by mouth daily.   gabapentin (NEURONTIN) 300 MG capsule Take 2 capsules (600 mg total) by mouth at bedtime.   lisinopril-hydrochlorothiazide (ZESTORETIC) 20-12.5 MG tablet TAKE 1 TABLET EVERY DAY   Multiple Vitamin (MULTIVITAMIN WITH MINERALS) TABS tablet Take 1 tablet by mouth daily.   Omega-3 Fatty Acids (FISH OIL PO) Take 2 capsules by mouth daily.   rosuvastatin (CRESTOR) 40 MG tablet TAKE 1 TABLET EVERY DAY   alendronate (FOSAMAX) 70 MG tablet Take 1 tablet (70 mg total) by mouth every 7 (seven) days. Take with a full glass of water on an empty stomach.   methocarbamol (ROBAXIN) 500 MG tablet Take 1 tablet (500 mg total) by mouth every 8 (eight) hours as needed for muscle spasms. (Patient not taking: Reported on 10/01/2022)   oxyCODONE-acetaminophen (PERCOCET)  5-325 MG tablet Take 1 tablet by mouth every 4 (four) hours as needed for severe pain.   No facility-administered encounter medications on file as of 10/01/2022.    Allergies (verified) Aspirin   History: Past Medical History:  Diagnosis Date   Bilateral leg pain    Dyslipidemia    Full dentures    H/O bone density study 04/2013   never   H/O mammogram 2010   Hematuria    microscopic, several prior evaluations, no source or cause  found   Hyperlipidemia    Hypertension    Impaired fasting blood sugar 2008   Influenza vaccine side effect    intolerance, declines   Pre-diabetes    Tobacco use    Wears glasses    Past Surgical History:  Procedure Laterality Date   ABDOMINAL HYSTERECTOMY  1980   total; due to heavy bleeding   APPENDECTOMY  1980   KNEE SURGERY     right   SHOULDER ARTHROSCOPY WITH SUBACROMIAL DECOMPRESSION, ROTATOR CUFF REPAIR AND BICEP TENDON REPAIR Left 08/20/2022   Procedure: left shoulder arthroscopy, debridement, biceps tenodesis, mini open rotator cuff repair;  Surgeon: Meredith Pel, MD;  Location: [facility];  Service: Orthopedics;  Laterality: Left;   SHOULDER SURGERY Right    rotator cuff   SPINE SURGERY     lumbar   TONSILLECTOMY     TUBAL LIGATION     Family History  Problem Relation Age of Onset   Alzheimer's disease Mother    Other Mother        died of UTI, dehydration   Other Father 46       failure to thrive, old age, fall and rib fracture   Diabetes Father    Diabetes Sister    Cancer Brother        brain, stomach   Colon cancer Neg Hx    Social History   Socioeconomic History   Marital status: Divorced    Spouse name: Not on file   Number of children: 2   Years of education: 12   Highest education level: High school graduate  Occupational History   Occupation: Receptionist - part time  Tobacco Use   Smoking status: Every Day    Packs/day: 0.50    Years: 40.00    Total pack years: 20.00    Types: Cigarettes   Smokeless tobacco: Never   Tobacco comments:    Smokes off and on, "I can go months and not smoke"  Vaping Use   Vaping Use: Never used  Substance and Sexual Activity   Alcohol use: No   Drug use: No   Sexual activity: Not on file  Other Topics Concern   Not on file  Social History Narrative   Lives alone (son moved out).  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   Right-handed.   No daily caffeine use.     Social Determinants of Health   Financial Resource Strain: Low Risk  (10/01/2022)   Overall Financial Resource Strain (CARDIA)    Difficulty of Paying Living Expenses: Not hard at all  Food Insecurity: No Food Insecurity (10/01/2022)   Hunger Vital Sign    Worried About Running Out of Food in the Last Year: Never true    Ran Out of Food in the Last Year: Never true  Transportation Needs: No Transportation Needs (10/01/2022)   PRAPARE - Hydrologist (Medical): No    Lack of Transportation (  Non-Medical): No  Physical Activity: Insufficiently Active (10/01/2022)   Exercise Vital Sign    Days of Exercise per Week: 3 days    Minutes of Exercise per Session: 30 min  Stress: No Stress Concern Present (10/01/2022)   [place]    Feeling of Stress : Not at all  Social Connections: Socially Isolated (10/01/2022)   Social Connection and Isolation Panel [NHANES]    Frequency of Communication with Friends and Family: More than three times a week    Frequency of Social Gatherings with Friends and Family: More than three times a week    Attends Religious Services: Never    Marine scientist or Organizations: No    Attends Music therapist: Never    Marital Status: Divorced    Tobacco Counseling Ready to quit: Yes Counseling given: Not Answered Tobacco comments: Smokes off and on, "I can go months and not smoke"   Clinical Intake:  Pre-visit preparation completed: Yes  Pain : No/denies pain     BMI - recorded: 30.73 Nutritional Status: BMI > 30  Obese Nutritional Risks: None Diabetes: No  How often do you need to have someone help you when you read instructions, pamphlets, or other written materials from your doctor or pharmacy?: 1 - Never  Diabetic?no  Interpreter Needed?: No  Information entered by :: B.Alinda Egolf,LPN   Activities of Daily Living    10/01/2022     9:30 AM 08/14/2022    2:08 PM  In your present state of health, do you have any difficulty performing the following activities:  Hearing? 0   Vision? 0   Difficulty concentrating or making decisions? 0   Walking or climbing stairs? 0   Dressing or bathing? 0   Doing errands, shopping? 0 0  Preparing Food and eating ? N   Using the Toilet? N   In the past six months, have you accidently leaked urine? N   Do you have problems with loss of bowel control? N   Managing your Medications? N   Managing your Finances? N   Housekeeping or managing your Housekeeping? N     Patient Care Team: Tysinger, Camelia Eng, PA-C as PCP - General (Family Medicine)  Indicate any recent Medical Services you may have received from other than [facility] providers in the past year (date may be approximate).     Assessment:   This is a routine wellness examination for [place].  Hearing/Vision screen Hearing Screening - Comments:: Hears adequately Vision Screening - Comments:: Wears glasses (no problems) [facility]  Dietary issues and exercise activities discussed: Current Exercise Habits: Home exercise routine, Type of exercise: walking;stretching, Time (Minutes): 60, Intensity: Mild, Exercise limited by: None identified   Goals Addressed             This Visit's Progress    Prevent falls       Continue to be fall free       Depression Screen    10/01/2022    9:25 AM 06/28/2022   11:05 AM 03/07/2022   10:10 AM 09/28/2021    2:19 PM 09/24/2021    2:12 PM 03/28/2021    9:13 AM 05/25/2020    9:35 AM  PHQ 2/9 Scores  PHQ - 2 Score 0 0 0 0 0 0 0    Fall Risk    10/01/2022    9:22 AM 06/28/2022   11:04 AM 03/07/2022   10:10 AM  09/28/2021    2:19 PM 09/24/2021    2:12 PM  Fall Risk   Falls in the past year? 0 0 0 0 0  Number falls in past yr: 0 0 0  0  Injury with Fall? 0 0 0  0  Risk for fall due to : No Fall Risks No Fall Risks No Fall Risks Medication side effect No Fall Risks   Follow up Education provided;Falls prevention discussed Falls evaluation completed Falls evaluation completed Falls evaluation completed;Education provided;Falls prevention discussed Falls evaluation completed    FALL RISK PREVENTION PERTAINING TO THE HOME:  Any stairs in or around the home? No  If so, are there any without handrails? No  Home free of loose throw rugs in walkways, pet beds, electrical cords, etc? Yes  Adequate lighting in your home to reduce risk of falls? Yes   ASSISTIVE DEVICES UTILIZED TO PREVENT FALLS:  Life alert? No  Use of a cane, walker or w/c? No  Grab bars in the bathroom? Yes  Shower chair or bench in shower? No  Elevated toilet seat or a handicapped toilet? High toilet seat       10/01/2022    9:32 AM 09/28/2021    2:21 PM  6CIT Screen  What Year? 0 points 0 points  What month? 0 points 0 points  What time? 0 points 0 points  Count back from 20 0 points 0 points  Months in reverse 0 points 0 points  Repeat phrase 0 points 2 points  Total Score 0 points 2 points    Immunizations Immunization History  Administered Date(s) Administered   Fluad Quad(high Dose 65+) 06/30/2019, 06/28/2022   Influenza Split 07/07/2007, 06/01/2014, 06/05/2015   Influenza, High Dose Seasonal PF 06/27/2021   Influenza-Unspecified 07/31/2016, 06/16/2017, 05/09/2018   Moderna Sars-Covid-2 Vaccination 04/11/2020, 05/11/2020, 05/03/2021   PPD Test 09/12/2014    TDAP status: Due, Education has been provided regarding the importance of this vaccine. Advised may receive this vaccine at local pharmacy or Health Dept. Aware to provide a copy of the vaccination record if obtained from local pharmacy or Health Dept. Verbalized acceptance and understanding.  Flu Vaccine status: Up to date  Pneumococcal vaccine status: Declined,  Education has been provided regarding the importance of this vaccine but patient still declined. Advised may receive this vaccine at local pharmacy or  Health Dept. Aware to provide a copy of the vaccination record if obtained from local pharmacy or Health Dept. Verbalized acceptance and understanding.   Covid-19 vaccine status: Completed vaccines  Qualifies for Shingles Vaccine? Yes   Zostavax completed No   Shingrix Completed?: No.    Education has been provided regarding the importance of this vaccine. Patient has been advised to call insurance company to determine out of pocket expense if they have not yet received this vaccine. Advised may also receive vaccine at local pharmacy or Health Dept. Verbalized acceptance and understanding.  Screening Tests Health Maintenance  Topic Date Due   Pneumonia Vaccine 6+ Years old (1 - PCV) Never done   DTaP/Tdap/Td (1 - Tdap) Never done   Lung Cancer Screening  Never done   Zoster Vaccines- Shingrix (1 of 2) Never done   COVID-19 Vaccine (4 - 2023-24 season) 05/10/2022   MAMMOGRAM  07/10/2022   Medicare Annual Wellness (AWV)  10/02/2023   COLONOSCOPY (Pts 45-60yr Insurance coverage will need to be confirmed)  04/15/2026   INFLUENZA VACCINE  Completed   DEXA SCAN  Completed   Hepatitis C Screening  Completed   HPV VACCINES  Aged Out    Health Maintenance  Health Maintenance Due  Topic Date Due   Pneumonia Vaccine 21+ Years old (1 - PCV) Never done   DTaP/Tdap/Td (1 - Tdap) Never done   Lung Cancer Screening  Never done   Zoster Vaccines- Shingrix (1 of 2) Never done   COVID-19 Vaccine (4 - 2023-24 season) 05/10/2022   MAMMOGRAM  07/10/2022    Colorectal cancer screening: Type of screening: Colonoscopy. Completed 08/22. Repeat every 5 years  Mammogram status: Completed yes. Repeat every year next 12/20/2022  Bone Density status: Completed no. Results reflect: Bone density results: NORMAL. Repeat every 5 years. Next is scheduled 12/20/22  Lung Cancer Screening: (Low Dose CT Chest recommended if Age 89-80 years, 30 pack-year currently smoking OR have quit w/in 15years.) does not  qualify.   Lung Cancer Screening Referral: no  Additional Screening:  Hepatitis C Screening: does not qualify; Completed no  Vision Screening: Recommended annual ophthalmology exams for early detection of glaucoma and other disorders of the eye. Is the patient up to date with their annual eye exam?  No  Who is the provider or what is the name of the office in which the patient attends annual eye exams? [facility]:will schedule If pt is not established with a provider, would they like to be referred to a provider to establish care? No .   Dental Screening: Recommended annual dental exams for proper oral hygiene. pt has dentures  Community Resource Referral / Chronic Care Management: CRR required this visit?  No   CCM required this visit?  No      Plan:     I have personally reviewed and noted the following in the patient's chart:   Medical and social history Use of alcohol, tobacco or illicit drugs  Current medications and supplements including opioid prescriptions. Patient is not currently taking opioid prescriptions. Functional ability and status Nutritional status Physical activity Advanced directives List of other physicians Hospitalizations, surgeries, and ER visits in previous 12 months Vitals Screenings to include cognitive, depression, and falls Referrals and appointments  In addition, I have reviewed and discussed with patient certain preventive protocols, quality metrics, and best practice recommendations. A written personalized care plan for preventive services as well as general preventive health recommendations were provided to patient.     Roger Shelter, LPN   1/61/0960   Nurse Notes: none

## 2022-10-01 NOTE — Patient Instructions (Signed)
Alyssa Carter , Thank you for taking time to come for your Medicare Wellness Visit. I appreciate your ongoing commitment to your health goals. Please review the following plan we discussed and let me know if I can assist you in the future.   These are the goals we discussed:  Goals      Patient Stated     09/28/2021, no goals     Prevent falls     Continue to be fall free        This is a list of the screening recommended for you and due dates:  Health Maintenance  Topic Date Due   Pneumonia Vaccine (1 - PCV) Never done   DTaP/Tdap/Td vaccine (1 - Tdap) Never done   Screening for Lung Cancer  Never done   Zoster (Shingles) Vaccine (1 of 2) Never done   COVID-19 Vaccine (4 - 2023-24 season) 05/10/2022   Mammogram  07/10/2022   Medicare Annual Wellness Visit  10/02/2023   Colon Cancer Screening  04/15/2026   Flu Shot  Completed   DEXA scan (bone density measurement)  Completed   Hepatitis C Screening: USPSTF Recommendation to screen - Ages 18-79 yo.  Completed   HPV Vaccine  Aged Out    Advanced directives: yes  Conditions/risks identified: none  Next appointment: Follow up in one year for your annual wellness visit 10/07/2023 '@10'$  am/telephone   Preventive Care 65 Years and Older, Female Preventive care refers to lifestyle choices and visits with your health care provider that can promote health and wellness. What does preventive care include? A yearly physical exam. This is also called an annual well check. Dental exams once or twice a year. Routine eye exams. Ask your health care provider how often you should have your eyes checked. Personal lifestyle choices, including: Daily care of your teeth and gums. Regular physical activity. Eating a healthy diet. Avoiding tobacco and drug use. Limiting alcohol use. Practicing safe sex. Taking low-dose aspirin every day. Taking vitamin and mineral supplements as recommended by your health care provider. What happens during an  annual well check? The services and screenings done by your health care provider during your annual well check will depend on your age, overall health, lifestyle risk factors, and family history of disease. Counseling  Your health care provider may ask you questions about your: Alcohol use. Tobacco use. Drug use. Emotional well-being. Home and relationship well-being. Sexual activity. Eating habits. History of falls. Memory and ability to understand (cognition). Work and work Statistician. Reproductive health. Screening  You may have the following tests or measurements: Height, weight, and BMI. Blood pressure. Lipid and cholesterol levels. These may be checked every 5 years, or more frequently if you are over 76 years old. Skin check. Lung cancer screening. You may have this screening every year starting at age 54 if you have a 30-pack-year history of smoking and currently smoke or have quit within the past 15 years. Fecal occult blood test (FOBT) of the stool. You may have this test every year starting at age 84. Flexible sigmoidoscopy or colonoscopy. You may have a sigmoidoscopy every 5 years or a colonoscopy every 10 years starting at age 49. Hepatitis C blood test. Hepatitis B blood test. Sexually transmitted disease (STD) testing. Diabetes screening. This is done by checking your blood sugar (glucose) after you have not eaten for a while (fasting). You may have this done every 1-3 years. Bone density scan. This is done to screen for osteoporosis. You may have  this done starting at age 49. Mammogram. This may be done every 1-2 years. Talk to your health care provider about how often you should have regular mammograms. Talk with your health care provider about your test results, treatment options, and if necessary, the need for more tests. Vaccines  Your health care provider may recommend certain vaccines, such as: Influenza vaccine. This is recommended every year. Tetanus,  diphtheria, and acellular pertussis (Tdap, Td) vaccine. You may need a Td booster every 10 years. Zoster vaccine. You may need this after age 34. Pneumococcal 13-valent conjugate (PCV13) vaccine. One dose is recommended after age 76. Pneumococcal polysaccharide (PPSV23) vaccine. One dose is recommended after age 5. Talk to your health care provider about which screenings and vaccines you need and how often you need them. This information is not intended to replace advice given to you by your health care provider. Make sure you discuss any questions you have with your health care provider. Document Released: 09/22/2015 Document Revised: 05/15/2016 Document Reviewed: 06/27/2015 Elsevier Interactive Patient Education  2017 [place] Prevention in the Home Falls can cause injuries. They can happen to people of all ages. There are many things you can do to make your home safe and to help prevent falls. What can I do on the outside of my home? Regularly fix the edges of walkways and driveways and fix any cracks. Remove anything that might make you trip as you walk through a door, such as a raised step or threshold. Trim any bushes or trees on the path to your home. Use bright outdoor lighting. Clear any walking paths of anything that might make someone trip, such as rocks or tools. Regularly check to see if handrails are loose or broken. Make sure that both sides of any steps have handrails. Any raised decks and porches should have guardrails on the edges. Have any leaves, snow, or ice cleared regularly. Use sand or salt on walking paths during winter. Clean up any spills in your garage right away. This includes oil or grease spills. What can I do in the bathroom? Use night lights. Install grab bars by the toilet and in the tub and shower. Do not use towel bars as grab bars. Use non-skid mats or decals in the tub or shower. If you need to sit down in the shower, use a plastic,  non-slip stool. Keep the floor dry. Clean up any water that spills on the floor as soon as it happens. Remove soap buildup in the tub or shower regularly. Attach bath mats securely with double-sided non-slip rug tape. Do not have throw rugs and other things on the floor that can make you trip. What can I do in the bedroom? Use night lights. Make sure that you have a light by your bed that is easy to reach. Do not use any sheets or blankets that are too big for your bed. They should not hang down onto the floor. Have a firm chair that has side arms. You can use this for support while you get dressed. Do not have throw rugs and other things on the floor that can make you trip. What can I do in the kitchen? Clean up any spills right away. Avoid walking on wet floors. Keep items that you use a lot in easy-to-reach places. If you need to reach something above you, use a strong step stool that has a grab bar. Keep electrical cords out of the way. Do not use floor polish or  wax that makes floors slippery. If you must use wax, use non-skid floor wax. Do not have throw rugs and other things on the floor that can make you trip. What can I do with my stairs? Do not leave any items on the stairs. Make sure that there are handrails on both sides of the stairs and use them. Fix handrails that are broken or loose. Make sure that handrails are as long as the stairways. Check any carpeting to make sure that it is firmly attached to the stairs. Fix any carpet that is loose or worn. Avoid having throw rugs at the top or bottom of the stairs. If you do have throw rugs, attach them to the floor with carpet tape. Make sure that you have a light switch at the top of the stairs and the bottom of the stairs. If you do not have them, ask someone to add them for you. What else can I do to help prevent falls? Wear shoes that: Do not have high heels. Have rubber bottoms. Are comfortable and fit you well. Are closed  at the toe. Do not wear sandals. If you use a stepladder: Make sure that it is fully opened. Do not climb a closed stepladder. Make sure that both sides of the stepladder are locked into place. Ask someone to hold it for you, if possible. Clearly mark and make sure that you can see: Any grab bars or handrails. First and last steps. Where the edge of each step is. Use tools that help you move around (mobility aids) if they are needed. These include: Canes. Walkers. Scooters. Crutches. Turn on the lights when you go into a dark area. Replace any light bulbs as soon as they burn out. Set up your furniture so you have a clear path. Avoid moving your furniture around. If any of your floors are uneven, fix them. If there are any pets around you, be aware of where they are. Review your medicines with your doctor. Some medicines can make you feel dizzy. This can increase your chance of falling. Ask your doctor what other things that you can do to help prevent falls. This information is not intended to replace advice given to you by your health care provider. Make sure you discuss any questions you have with your health care provider. Document Released: 06/22/2009 Document Revised: 02/01/2016 Document Reviewed: 09/30/2014 Elsevier Interactive Patient Education  2017 [employer].

## 2022-10-04 ENCOUNTER — Ambulatory Visit: Payer: Medicare Other

## 2022-10-09 ENCOUNTER — Ambulatory Visit (INDEPENDENT_AMBULATORY_CARE_PROVIDER_SITE_OTHER): Payer: Medicare PPO | Admitting: Orthopedic Surgery

## 2022-10-09 ENCOUNTER — Encounter: Payer: Medicare PPO | Admitting: Orthopedic Surgery

## 2022-10-09 ENCOUNTER — Encounter: Payer: Self-pay | Admitting: Orthopedic Surgery

## 2022-10-09 DIAGNOSIS — Z9889 Other specified postprocedural states: Secondary | ICD-10-CM

## 2022-10-09 NOTE — Progress Notes (Signed)
Post-Op Visit Note   Patient: Alyssa Carter           Date of Birth: September 21, 1945           MRN: [id number] Visit Date: 10/09/2022 PCP: Carlena Hurl, PA-C   Assessment & Plan:  Chief Complaint:  Chief Complaint  Patient presents with   Left Shoulder - Routine Post Op    08/20/22 (7w 1d) left shoulder arthroscopy, debridement, biceps tenodesis, mini open rotator cuff repair     Visit Diagnoses:  1. S/P left rotator cuff repair     Plan: Alyssa Carter is a 77 year old patient is 6 weeks out left shoulder rotator cuff tear repair and biceps tenodesis.  Having some soreness in the shoulder which is not unexpected.  Hard for her to lay on the left-hand side.  Doing a daily home exercise program.  Taking ibuprofen with relief.  She does have green bands that she is doing.  Not really doing any formal physical therapy.  On examination her range of motion is 45/90/110.  Excellent external rotation strength on the left.  No coarseness with internal/external rotation at 0 and 90 degrees of abduction.  Plan is continue with home exercise program.  Anticipate continued improvement.  Follow-up in 6 weeks for final check.  Follow-Up Instructions: No follow-ups on file.   Orders:  No orders of the defined types were placed in this encounter.  No orders of the defined types were placed in this encounter.   Imaging: No results found.  PMFS History: Patient Active Problem List   Diagnosis Date Noted   Biceps tendonitis, left 08/25/2022   Nontraumatic complete tear of left rotator cuff 08/25/2022   Synovitis of left shoulder 08/25/2022   Rotator cuff tear arthropathy of left shoulder 07/10/2022   Former smoker 06/28/2022   Acute pain of left shoulder 06/28/2022   Screening for hematuria or proteinuria 06/28/2022   Screening for heart disease 06/28/2022   Encounter for screening mammogram for malignant neoplasm of breast 06/28/2022   Needs flu shot 06/28/2022   Bunion of right foot  03/28/2021   Flat feet 03/28/2021   Bunion 03/28/2021   Radiculopathy, lumbar region 10/19/2020   Right cervical radiculopathy 10/19/2020   Muscle cramp 08/30/2020   Right leg paresthesias 08/08/2020   Phlebitis 07/07/2020   Right leg pain 07/07/2020   Screening for lung cancer 07/07/2020   Medicare annual wellness visit, subsequent 05/25/2020   Neuropathy 05/25/2020   Vaccine counseling 05/25/2020   Leg cramping 05/25/2020   Varicose veins of both lower extremities 05/25/2020   Spider veins 05/25/2020   Ringing in ear, bilateral 05/25/2020   Impaired fasting blood sugar 08/20/2019   Vitamin D deficiency 08/20/2019   S/P hysterectomy 08/20/2019   Osteoporosis without current pathological fracture 08/27/2017   Encounter for health maintenance examination in adult 01/08/2017   Estrogen deficiency 01/08/2017   PVD (peripheral vascular disease) ([place]) 01/08/2017   Chronic right shoulder pain 01/08/2017   Paresthesia of both lower extremities 05/22/2015   Essential hypertension 05/22/2015   Mixed dyslipidemia 05/22/2015   History of back surgery 05/22/2015   Advance directive discussed with patient 05/22/2015   Past Medical History:  Diagnosis Date   Bilateral leg pain    Dyslipidemia    Full dentures    H/O bone density study 04/2013   never   H/O mammogram 2010   Hematuria    microscopic, several prior evaluations, no source or cause found   Hyperlipidemia  Hypertension    Impaired fasting blood sugar 2008   Influenza vaccine side effect    intolerance, declines   Pre-diabetes    Tobacco use    Wears glasses     Family History  Problem Relation Age of Onset   Alzheimer's disease Mother    Other Mother        died of UTI, dehydration   Other Father 28       failure to thrive, old age, fall and rib fracture   Diabetes Father    Diabetes Sister    Cancer Brother        brain, stomach   Colon cancer Neg Hx     Past Surgical History:  Procedure Laterality Date    ABDOMINAL HYSTERECTOMY  1980   total; due to heavy bleeding   APPENDECTOMY  1980   KNEE SURGERY     right   SHOULDER ARTHROSCOPY WITH SUBACROMIAL DECOMPRESSION, ROTATOR CUFF REPAIR AND BICEP TENDON REPAIR Left 08/20/2022   Procedure: left shoulder arthroscopy, debridement, biceps tenodesis, mini open rotator cuff repair;  Surgeon: Meredith Pel, MD;  Location: [facility];  Service: Orthopedics;  Laterality: Left;   SHOULDER SURGERY Right    rotator cuff   SPINE SURGERY     lumbar   TONSILLECTOMY     TUBAL LIGATION     Social History   Occupational History   Occupation: Receptionist - part time  Tobacco Use   Smoking status: Every Day    Packs/day: 0.50    Years: 40.00    Total pack years: 20.00    Types: Cigarettes   Smokeless tobacco: Never   Tobacco comments:    Smokes off and on, "I can go months and not smoke"  Vaping Use   Vaping Use: Never used  Substance and Sexual Activity   Alcohol use: No   Drug use: No   Sexual activity: Not on file

## 2022-10-16 ENCOUNTER — Other Ambulatory Visit: Payer: Self-pay | Admitting: Medical

## 2022-11-13 ENCOUNTER — Ambulatory Visit (INDEPENDENT_AMBULATORY_CARE_PROVIDER_SITE_OTHER): Payer: Medicare PPO | Admitting: Surgical

## 2022-11-13 DIAGNOSIS — Z9889 Other specified postprocedural states: Secondary | ICD-10-CM

## 2022-11-16 ENCOUNTER — Encounter: Payer: Self-pay | Admitting: Surgical

## 2022-11-16 NOTE — Progress Notes (Signed)
Post-Op Visit Note   Patient: Alyssa Carter           Date of Birth: December 27, 1945           MRN: [id number] Visit Date: 11/13/2022 PCP: Carlena Hurl, PA-C   Assessment & Plan:  Chief Complaint:  Chief Complaint  Patient presents with   Left Shoulder - Routine Post Op    08/20/22 (12w 1d)left shoulder arthroscopy, debridement, biceps tenodesis, mini open rotator cuff repair    Visit Diagnoses: No diagnosis found.  Plan: Patient is a 77 year old female who presents s/p left shoulder arthroscopy with debridement, bicep tenodesis, mini open rotator cuff tear repair on 08/20/2022.  Overall she is doing very well.  She really has no complaints.  She has been doing a home exercise program and not doing any formal physical therapy per her request.  She is very satisfied with how her shoulder is feeling now that she is about 3 months out from surgery.  She is sleeping well at night.  She is not taking any medications for pain.  No significant mechanical symptoms or any weakness to the arm that she has noticed.  On exam, patient has 50 degrees X rotation, 90 degrees abduction, 145 degrees forward elevation passively and actively.  She has excellent rotator cuff strength of supra, infra, subscap rated 5/5.  Intact axillary nerve with deltoid firing.  She has incisions that are well-healed.  Negative Hornblower sign.  Negative external rotation lag sign.  No coarseness or crepitus noted passive motion of the shoulder.  Plan is to continue with home exercise program.  Will plan for her to follow-up with the office as needed now that she is doing so well out from rotator cuff repair.  I did counsel her that this shoulder is not a 100% normal shoulder and she should still treat this somewhat lightly and particularly avoid overhead lifting and lifting away from her body.  She agreed with this plan and she will follow-up with the office as needed.  Follow-Up Instructions: No follow-ups on file.    Orders:  No orders of the defined types were placed in this encounter.  No orders of the defined types were placed in this encounter.   Imaging: No results found.  PMFS History: Patient Active Problem List   Diagnosis Date Noted   Biceps tendonitis, left 08/25/2022   Nontraumatic complete tear of left rotator cuff 08/25/2022   Synovitis of left shoulder 08/25/2022   Rotator cuff tear arthropathy of left shoulder 07/10/2022   Former smoker 06/28/2022   Acute pain of left shoulder 06/28/2022   Screening for hematuria or proteinuria 06/28/2022   Screening for heart disease 06/28/2022   Encounter for screening mammogram for malignant neoplasm of breast 06/28/2022   Needs flu shot 06/28/2022   Bunion of right foot 03/28/2021   Flat feet 03/28/2021   Bunion 03/28/2021   Radiculopathy, lumbar region 10/19/2020   Right cervical radiculopathy 10/19/2020   Muscle cramp 08/30/2020   Right leg paresthesias 08/08/2020   Phlebitis 07/07/2020   Right leg pain 07/07/2020   Screening for lung cancer 07/07/2020   Medicare annual wellness visit, subsequent 05/25/2020   Neuropathy 05/25/2020   Vaccine counseling 05/25/2020   Leg cramping 05/25/2020   Varicose veins of both lower extremities 05/25/2020   Spider veins 05/25/2020   Ringing in ear, bilateral 05/25/2020   Impaired fasting blood sugar 08/20/2019   Vitamin D deficiency 08/20/2019   S/P hysterectomy 08/20/2019   Osteoporosis  without current pathological fracture 08/27/2017   Encounter for health maintenance examination in adult 01/08/2017   Estrogen deficiency 01/08/2017   PVD (peripheral vascular disease) ([place]) 01/08/2017   Chronic right shoulder pain 01/08/2017   Paresthesia of both lower extremities 05/22/2015   Essential hypertension 05/22/2015   Mixed dyslipidemia 05/22/2015   History of back surgery 05/22/2015   Advance directive discussed with patient 05/22/2015   Past Medical History:  Diagnosis Date    Bilateral leg pain    Dyslipidemia    Full dentures    H/O bone density study 04/2013   never   H/O mammogram 2010   Hematuria    microscopic, several prior evaluations, no source or cause found   Hyperlipidemia    Hypertension    Impaired fasting blood sugar 2008   Influenza vaccine side effect    intolerance, declines   Pre-diabetes    Tobacco use    Wears glasses     Family History  Problem Relation Age of Onset   Alzheimer's disease Mother    Other Mother        died of UTI, dehydration   Other Father 84       failure to thrive, old age, fall and rib fracture   Diabetes Father    Diabetes Sister    Cancer Brother        brain, stomach   Colon cancer Neg Hx     Past Surgical History:  Procedure Laterality Date   ABDOMINAL HYSTERECTOMY  1980   total; due to heavy bleeding   APPENDECTOMY  1980   KNEE SURGERY     right   SHOULDER ARTHROSCOPY WITH SUBACROMIAL DECOMPRESSION, ROTATOR CUFF REPAIR AND BICEP TENDON REPAIR Left 08/20/2022   Procedure: left shoulder arthroscopy, debridement, biceps tenodesis, mini open rotator cuff repair;  Surgeon: Meredith Pel, MD;  Location: [facility];  Service: Orthopedics;  Laterality: Left;   SHOULDER SURGERY Right    rotator cuff   SPINE SURGERY     lumbar   TONSILLECTOMY     TUBAL LIGATION     Social History   Occupational History   Occupation: Receptionist - part time  Tobacco Use   Smoking status: Every Day    Packs/day: 0.50    Years: 40.00    Total pack years: 20.00    Types: Cigarettes   Smokeless tobacco: Never   Tobacco comments:    Smokes off and on, "I can go months and not smoke"  Vaping Use   Vaping Use: Never used  Substance and Sexual Activity   Alcohol use: No   Drug use: No   Sexual activity: Not on file

## 2022-12-20 ENCOUNTER — Ambulatory Visit
Admission: RE | Admit: 2022-12-20 | Discharge: 2022-12-20 | Disposition: A | Discharge status: Home / Self Care | Payer: Medicare PPO | Source: Ambulatory Visit | Attending: Medical | Admitting: Medical

## 2022-12-20 DIAGNOSIS — Z1231 Encounter for screening mammogram for malignant neoplasm of breast: Secondary | ICD-10-CM

## 2022-12-20 DIAGNOSIS — M810 Age-related osteoporosis without current pathological fracture: Secondary | ICD-10-CM

## 2022-12-22 NOTE — Progress Notes (Signed)
Results sent through MyChart

## 2022-12-23 NOTE — Progress Notes (Signed)
Results sent through MyChart

## 2022-12-26 ENCOUNTER — Encounter: Payer: Self-pay | Admitting: Medical

## 2022-12-26 ENCOUNTER — Ambulatory Visit (INDEPENDENT_AMBULATORY_CARE_PROVIDER_SITE_OTHER): Payer: Medicare PPO | Admitting: Medical

## 2022-12-26 VITALS — BP 106/68 | HR 85 | Wt 169.0 lb

## 2022-12-26 DIAGNOSIS — I10 Essential (primary) hypertension: Secondary | ICD-10-CM | POA: Diagnosis not present

## 2022-12-26 DIAGNOSIS — R42 Dizziness and giddiness: Secondary | ICD-10-CM | POA: Diagnosis not present

## 2022-12-26 MED ORDER — MECLIZINE HCL 25 MG PO TABS: 25.0000 mg | ORAL_TABLET | Freq: Two times a day (BID) | ORAL | 0 refills | Status: DC | PRN

## 2022-12-26 NOTE — Progress Notes (Signed)
Subjective: Chief Complaint  Patient presents with   Dizziness    Feels dizziness when looking up or down, right ear feels like it's filled with fluid since Saturday, couldn't get up after laying down during bone density scan last Friday   Here today for dizziness.   Feels dizzy looking up or down.  Can get nausea at times.  Feels like room spinning sensation.   Feels fullness in ear.   Denies spring allergy problems.   No recent congestion, sneezing, or allergy symptoms.  Went for recent bone density test recently, had hard time getting off the bed due to dizziness.  No palpitations, no chest pain, no leg swelling, no confusion, slurred speech, no numbness tingling or weakness.  No fall.  No syncope.  Compliant with medication including BP medicaiton.  Home BP 120/68-70.    No other aggravating or relieving factors. No other complaint.   Past Medical History:  Diagnosis Date   Bilateral leg pain    Dyslipidemia    Full dentures    H/O bone density study 04/2013   never   H/O mammogram 2010   Hematuria    microscopic, several prior evaluations, no source or cause found   Hyperlipidemia    Hypertension    Impaired fasting blood sugar 2008   Influenza vaccine side effect    intolerance, declines   Pre-diabetes    Tobacco use    Wears glasses    Current Outpatient Medications on File Prior to Visit  Medication Sig Dispense Refill   Cholecalciferol (VITAMIN D) 50 MCG (2000 UT) CAPS Take 1 capsule (2,000 Units total) by mouth daily. 90 capsule 3   gabapentin (NEURONTIN) 300 MG capsule Take 2 capsules (600 mg total) by mouth at bedtime. 180 capsule 4   lisinopril-hydrochlorothiazide (ZESTORETIC) 20-12.5 MG tablet TAKE 1 TABLET EVERY DAY 90 tablet 1   Multiple Vitamin (MULTIVITAMIN WITH MINERALS) TABS tablet Take 1 tablet by mouth daily.     Omega-3 Fatty Acids (FISH OIL PO) Take 2 capsules by mouth daily.     rosuvastatin (CRESTOR) 40 MG tablet TAKE 1 TABLET EVERY DAY 90 tablet 1    No current facility-administered medications on file prior to visit.   ROS as in subjective    Objective: BP 106/68 (BP Location: Left Arm, Patient Position: Sitting, Cuff Size: Large)   Pulse 85   Wt 169 lb (76.7 kg)   SpO2 100%   BMI 30.91 kg/m   BP Readings from Last 3 Encounters:  12/26/22 106/68  08/20/22 135/61  08/14/22 116/66     General appearence: alert, no distress, WD/WN,  HEENT: normocephalic, sclerae anicteric, PERRLA, EOMi, flat TMs, decreased light reflex but no erythema or effusion, nares patent, no discharge or erythema, pharynx normal Oral cavity: MMM, no lesions Neck: supple, no lymphadenopathy, no thyromegaly, no masses Heart: RRR, normal S1, S2, no murmurs Lungs: CTA bilaterally, no wheezes, rhonchi, or rales Extremities: no edema, no cyanosis, no clubbing Pulses: 2+ symmetric, upper and lower extremities, normal cap refill Neurological: alert, oriented x 3, CN2-12 intact, strength normal upper extremities and lower extremities, sensation normal throughout, DTRs 2+ throughout, no cerebellar signs, gait normal Psychiatric: normal affect, behavior normal, pleasant    Assessment: Encounter Diagnoses  Name Primary?   Dizziness Yes   Vertigo    Essential hypertension      Plan: Discussed symptoms, concerns, possible causes. Negative orthostasis today.  Begin trial of Meclizine . Discussed risks/benefits of medicaiton.   She declined referral to  vestibular rehab.    If new or worse symptoms, then recheck.   No obvious indication or finding today to warrant head imaging.   I did advised increase water intake as she is not drinking enough water daily.  Gradie was seen today for dizziness.  Diagnoses and all orders for this visit:  Dizziness  Vertigo  Essential hypertension  Other orders -     meclizine (ANTIVERT) 25 MG tablet; Take 1 tablet (25 mg total) by mouth 2 (two) times daily as needed for dizziness.    F/u prn

## 2023-01-06 ENCOUNTER — Other Ambulatory Visit: Payer: Self-pay | Admitting: Medical

## 2023-03-21 ENCOUNTER — Ambulatory Visit: Admission: RE | Admit: 2023-03-21 | Payer: Medicare PPO | Source: Ambulatory Visit

## 2023-03-21 ENCOUNTER — Ambulatory Visit (INDEPENDENT_AMBULATORY_CARE_PROVIDER_SITE_OTHER): Payer: Medicare PPO | Admitting: Medical

## 2023-03-21 VITALS — BP 120/72 | HR 71 | Temp 97.6°F | Wt 167.0 lb

## 2023-03-21 DIAGNOSIS — M79671 Pain in right foot: Secondary | ICD-10-CM | POA: Diagnosis not present

## 2023-03-21 DIAGNOSIS — I10 Essential (primary) hypertension: Secondary | ICD-10-CM

## 2023-03-21 DIAGNOSIS — M7731 Calcaneal spur, right foot: Secondary | ICD-10-CM | POA: Diagnosis not present

## 2023-03-21 DIAGNOSIS — M21611 Bunion of right foot: Secondary | ICD-10-CM

## 2023-03-21 MED ORDER — COLCHICINE 0.6 MG PO TABS: 0.6000 mg | ORAL_TABLET | Freq: Two times a day (BID) | ORAL | 0 refills | Status: DC

## 2023-03-21 NOTE — Patient Instructions (Addendum)
Recommendations: Your foot and toe pain could suggest arthritis versus gout versus bunion.  You do have a bunion as the great toe is starting the posterior right toe.  Lets get a baseline x-ray and check a uric acid level to screen for gout.  Over the weekend begin colchicine twice daily for the next 3 to 7 days to help with pain and inflammation.  This is a prescription medication.  You can continue ibuprofen or Aleve over-the-counter short-term for the next 3 to 5 days  For worse or breakthrough pain you can use on the pain medicine you have at home as needed.  Rest the foot, avoid excess walking or activity this weekend.  If your uric acid comes back high or if the findings suggest gout we may need to consider some other things going forward such as possibly taken away your hydrochlorothiazide fluid pill from your blood pressure pill or other intervention.  Since there is some mild bunion findings as far as deviation of the second toe, consider an over-the-counter toe splint for bunion     Please go to [facility] for your right foot xray.   Their hours are 8am - 4:30 pm Monday - Friday.  Take your insurance card with you.  [facility] [phone]   [address] [place], [place] [address]

## 2023-03-21 NOTE — Progress Notes (Signed)
Subjective:  Alyssa Carter is a 77 y.o. female who presents for Chief Complaint  Patient presents with   pain in foot    Pain in right foot, possible Gout,      Here for pain in right foot.  Awoke 3 days ago with pain in foot.  No injury, no trauma, no fall.  Concerned about gout or bunion.  No prior history of gout.  However she does note that in the past year her great toe has been pushing over towards the right second toe.  No fever, no redness or warmth.  No puncture wound or abrasion. No other aggravating or relieving factors.    No other c/o.  Past Medical History:  Diagnosis Date   Bilateral leg pain    Dyslipidemia    Full dentures    H/O bone density study 04/2013   never   H/O mammogram 2010   Hematuria    microscopic, several prior evaluations, no source or cause found   Hyperlipidemia    Hypertension    Impaired fasting blood sugar 2008   Influenza vaccine side effect    intolerance, declines   Pre-diabetes    Tobacco use    Wears glasses    Current Outpatient Medications on File Prior to Visit  Medication Sig Dispense Refill   Cholecalciferol (VITAMIN D) 50 MCG (2000 UT) CAPS Take 1 capsule (2,000 Units total) by mouth daily. 90 capsule 3   gabapentin (NEURONTIN) 300 MG capsule TAKE 2 CAPSULES AT BEDTIME 180 capsule 0   lisinopril-hydrochlorothiazide (ZESTORETIC) 20-12.5 MG tablet TAKE 1 TABLET EVERY DAY 90 tablet 1   Multiple Vitamin (MULTIVITAMIN WITH MINERALS) TABS tablet Take 1 tablet by mouth daily.     Omega-3 Fatty Acids (FISH OIL PO) Take 2 capsules by mouth daily.     rosuvastatin (CRESTOR) 40 MG tablet TAKE 1 TABLET EVERY DAY 90 tablet 1   meclizine (ANTIVERT) 25 MG tablet Take 1 tablet (25 mg total) by mouth 2 (two) times daily as needed for dizziness. (Patient not taking: Reported on 03/21/2023) 30 tablet 0   No current facility-administered medications on file prior to visit.     The following portions of the patient's history were reviewed  and updated as appropriate: allergies, current medications, past family history, past medical history, past social history, past surgical history and problem list.  ROS Otherwise as in subjective above  Objective: BP 120/72   Pulse 71   Temp 97.6 F (36.4 C)   Wt 167 lb (75.8 kg)   BMI 30.54 kg/m   General appearance: alert, no distress, well developed, well nourished Tender over right great toe at the MTP and there is some slight deviation causing some overlapping of the right great toe and second toe.  Right toe with decreased range of motion pain with range of motion.  There are spider veins throughout the dorsal bilateral feet and ankles.  Same thing on the left side except less significant deviation.  Otherwise feet and ankle nontender with normal range of motion.   Ext: Mild puffy 1+ nonpitting ankle edema 1+ pedal pulses with slightly delayed cap refill Strength and sensation of feet normal   Assessment: Encounter Diagnoses  Name Primary?   Right foot pain Yes   Bunion of right foot    Essential hypertension      Plan: Discussed findings, symptoms, possible differential.  Discussed recommendations below which were also printed for patient.  Patient Instructions  Recommendations: Your foot and toe  pain could suggest arthritis versus gout versus bunion.  You do have a bunion as the great toe is starting the posterior right toe.  Lets get a baseline x-ray and check a uric acid level to screen for gout.  Over the weekend begin colchicine twice daily for the next 3 to 7 days to help with pain and inflammation.  This is a prescription medication.  You can continue ibuprofen or Aleve over-the-counter short-term for the next 3 to 5 days  For worse or breakthrough pain you can use on the pain medicine you have at home as needed.  Rest the foot, avoid excess walking or activity this weekend.  If your uric acid comes back high or if the findings suggest gout we may need to  consider some other things going forward such as possibly taken away your hydrochlorothiazide fluid pill from your blood pressure pill or other intervention.  Since there is some mild bunion findings as far as deviation of the second toe, consider an over-the-counter toe splint for bunion     Please go to [facility] for your right foot xray.   Their hours are 8am - 4:30 pm Monday - Friday.  Take your insurance card with you.  [facility] [phone]   [address] [place], [place] [address]   Alyssa Carter was seen today for pain in foot.  Diagnoses and all orders for this visit:  Right foot pain -     Uric Acid -     DG Foot Complete Right; Future  Bunion of right foot  Essential hypertension  Other orders -     colchicine 0.6 MG tablet; Take 1 tablet (0.6 mg total) by mouth 2 (two) times daily.    Follow up: pending labs, xray

## 2023-03-22 LAB — URIC ACID: Uric Acid: 7.8 mg/dL (ref 3.1–7.9)

## 2023-03-24 NOTE — Progress Notes (Signed)
 Results sent through MyChart

## 2023-03-28 ENCOUNTER — Other Ambulatory Visit: Payer: Self-pay | Admitting: Medical

## 2023-03-28 MED ORDER — LISINOPRIL 20 MG PO TABS: 20.0000 mg | ORAL_TABLET | Freq: Every day | ORAL | 0 refills | Status: DC

## 2023-03-28 NOTE — Progress Notes (Signed)
Xray ok.  Are you seeing improvements?  It may be worth changing your BP medication and removing the fluid pill part and see if this helps moving forward.  Let me know if agreeable to this and I'll send the plain Lisinopril.

## 2023-04-08 ENCOUNTER — Other Ambulatory Visit: Payer: Self-pay | Admitting: Medical

## 2023-04-08 ENCOUNTER — Telehealth: Payer: Self-pay | Admitting: Medical

## 2023-04-08 MED ORDER — GABAPENTIN 300 MG PO CAPS: 600.0000 mg | ORAL_CAPSULE | Freq: Every day | ORAL | 1 refills | Status: DC

## 2023-04-08 NOTE — Telephone Encounter (Signed)
Prescription Request  04/08/2023  LOV: 03/21/2023  What is the name of the medication or equipment? Gabapentin   Have you contacted your pharmacy to request a refill? No   Which pharmacy would you like this sent to? [facility]    Patient notified that their request is being sent to the clinical staff for review and that they should receive a response within 2 business days.   Please advise at Mobile [phone] (mobile)

## 2023-04-13 NOTE — Telephone Encounter (Signed)
done

## 2023-06-12 ENCOUNTER — Other Ambulatory Visit: Payer: Self-pay | Admitting: Medical

## 2023-08-24 ENCOUNTER — Other Ambulatory Visit: Payer: Self-pay | Admitting: Medical

## 2023-09-23 ENCOUNTER — Encounter: Payer: Self-pay | Admitting: Internal Medicine

## 2023-09-23 ENCOUNTER — Other Ambulatory Visit: Payer: Self-pay | Admitting: Medical

## 2023-10-07 ENCOUNTER — Ambulatory Visit: Payer: Medicare HMO

## 2023-10-07 DIAGNOSIS — Z0000 Encounter for general adult medical examination without abnormal findings: Secondary | ICD-10-CM | POA: Diagnosis not present

## 2023-10-07 NOTE — Progress Notes (Signed)
Subjective:   Alyssa Carter is a 78 y.o. female who presents for Medicare Annual (Subsequent) preventive examination.  Visit Complete: Virtual I connected with  Alyssa Carter on 10/07/23 by a audio enabled telemedicine application and verified that I am speaking with the correct person using two identifiers.  Interactive audio and video telecommunications were attempted between this provider and patient, however failed, due to patient having technical difficulties OR patient did not have access to video capability.  We continued and completed visit with audio only.  Patient Location: Home  Provider Location: Office/Clinic  I discussed the limitations of evaluation and management by telemedicine. The patient expressed understanding and agreed to proceed.  Vital Signs: Because this visit was a virtual/telehealth visit, some criteria may be missing or patient reported. Any vitals not documented were not able to be obtained and vitals that have been documented are patient reported.  Patient Medicare AWV questionnaire was completed by the patient on 10/06/2023; I have confirmed that all information answered by patient is correct and no changes since this date.  Cardiac Risk Factors include: advanced age (>26men, >25 women);dyslipidemia;hypertension     Objective:    Today's Vitals   10/07/23 1011  PainSc: 7    There is no height or weight on file to calculate BMI.     10/07/2023   10:17 AM 10/01/2022    9:29 AM 08/14/2022    2:04 PM 09/28/2021    2:18 PM 05/25/2020    9:35 AM 12/04/2017   11:03 AM 04/01/2016    9:05 AM  Advanced Directives  Does Patient Have a Medical Advance Directive? Yes Yes Yes Yes Yes Yes Yes  Type of Estate agent of [place];Living will Living will;Healthcare Power of [employer] Power of [place];Living will Healthcare Power of [place];Living will Living will;Healthcare Power of [employer] Power of [employer] Power of Attorney  Does patient want to make changes to medical advance directive?  Yes (MAU/Ambulatory/Procedural Areas - Information given)    No - Patient declined   Copy of Healthcare Power of Attorney in Chart? Yes - validated most recent copy scanned in chart (See row information) Yes - validated most recent copy scanned in chart (See row information) Yes - validated most recent copy scanned in chart (See row information) Yes - validated most recent copy scanned in chart (See row information)  No - copy requested     Current Medications (verified) Outpatient Encounter Medications as of 10/07/2023  Medication Sig   Cholecalciferol (VITAMIN D) 50 MCG (2000 UT) CAPS Take 1 capsule (2,000 Units total) by mouth daily.   gabapentin (NEURONTIN) 300 MG capsule TAKE 2 CAPSULES AT BEDTIME   lisinopril (ZESTRIL) 20 MG tablet TAKE 1 TABLET EVERY DAY   Multiple Vitamin (MULTIVITAMIN WITH MINERALS) TABS tablet Take 1 tablet by mouth daily.   Omega-3 Fatty Acids (FISH OIL PO) Take 2 capsules by mouth daily.   rosuvastatin (CRESTOR) 40 MG tablet TAKE 1 TABLET EVERY DAY   colchicine 0.6 MG tablet Take 1 tablet (0.6 mg total) by mouth 2 (two) times daily.   lisinopril-hydrochlorothiazide (ZESTORETIC) 20-12.5 MG tablet TAKE 1 TABLET EVERY DAY (Patient not taking: Reported on 10/07/2023)   meclizine (ANTIVERT) 25 MG tablet Take 1 tablet (25 mg total) by mouth 2 (two) times daily as needed for dizziness. (Patient not taking: Reported on 03/21/2023)   No facility-administered encounter medications on file as of 10/07/2023.    Allergies (verified) Aspirin   History: Past  Medical History:  Diagnosis Date   Bilateral leg pain    Dyslipidemia    Full dentures    H/O bone density study 04/2013   never   H/O mammogram 2010   Hematuria    microscopic, several prior evaluations, no source or cause found   Hyperlipidemia    Hypertension    Impaired fasting blood sugar 2008   Influenza vaccine  side effect    intolerance, declines   Pre-diabetes    Tobacco use    Wears glasses    Past Surgical History:  Procedure Laterality Date   ABDOMINAL HYSTERECTOMY  1980   total; due to heavy bleeding   APPENDECTOMY  1980   KNEE SURGERY     right   SHOULDER ARTHROSCOPY WITH SUBACROMIAL DECOMPRESSION, ROTATOR CUFF REPAIR AND BICEP TENDON REPAIR Left 08/20/2022   Procedure: left shoulder arthroscopy, debridement, biceps tenodesis, mini open rotator cuff repair;  Surgeon: Cammy Copa, MD;  Location: [facility] OR;  Service: Orthopedics;  Laterality: Left;   SHOULDER SURGERY Right    rotator cuff   SPINE SURGERY     lumbar   TONSILLECTOMY     TUBAL LIGATION     Family History  Problem Relation Age of Onset   Alzheimer's disease Mother    Other Mother        died of UTI, dehydration   Other Father 88       failure to thrive, old age, fall and rib fracture   Diabetes Father    Diabetes Sister    Cancer Brother        brain, stomach   Colon cancer Neg Hx    Social History   Socioeconomic History   Marital status: Divorced    Spouse name: Not on file   Number of children: 2   Years of education: 12   Highest education level: High school graduate  Occupational History   Occupation: Receptionist - part time  Tobacco Use   Smoking status: Every Day    Current packs/day: 0.50    Average packs/day: 0.5 packs/day for 40.0 years (20.0 ttl pk-yrs)    Types: Cigarettes   Smokeless tobacco: Never   Tobacco comments:    Smokes off and on, "I can go months and not smoke"  Vaping Use   Vaping status: Never Used  Substance and Sexual Activity   Alcohol use: No   Drug use: No   Sexual activity: Not on file  Other Topics Concern   Not on file  Social History Narrative   Lives alone (son moved out).  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   Right-handed.   No daily caffeine use.    Social Drivers of Corporate investment banker Strain: Low  Risk  (10/07/2023)   Overall Financial Resource Strain (CARDIA)    Difficulty of Paying Living Expenses: Not hard at all  Food Insecurity: No Food Insecurity (10/07/2023)   Hunger Vital Sign    Worried About Running Out of Food in the Last Year: Never true    Ran Out of Food in the Last Year: Never true  Transportation Needs: No Transportation Needs (10/07/2023)   PRAPARE - Administrator, Civil Service (Medical): No    Lack of Transportation (Non-Medical): No  Physical Activity: Insufficiently Active (10/07/2023)   Exercise Vital Sign    Days of Exercise per Week: 3 days    Minutes of Exercise per Session: 30 min  Stress: No  Stress Concern Present (10/07/2023)   [employer] of Occupational Health - Occupational Stress Questionnaire    Feeling of Stress : Not at all  Social Connections: Socially Isolated (10/07/2023)   Social Connection and Isolation Panel [NHANES]    Frequency of Communication with Friends and Family: More than three times a week    Frequency of Social Gatherings with Friends and Family: More than three times a week    Attends Religious Services: Never    Database administrator or Organizations: No    Attends Engineer, structural: Never    Marital Status: Divorced    Tobacco Counseling Ready to quit: No Counseling given: Not Answered Tobacco comments: Smokes off and on, "I can go months and not smoke"   Clinical Intake:  Pre-visit preparation completed: Yes  Pain : 0-10 Pain Score: 7  Pain Type: Chronic pain Pain Location: Leg Pain Orientation: Right Pain Descriptors / Indicators: Aching Pain Onset: More than a month ago Pain Frequency: Intermittent     Nutritional Risks: None Diabetes: No  How often do you need to have someone help you when you read instructions, pamphlets, or other written materials from your doctor or pharmacy?: 1 - Never  Interpreter Needed?: No  Information entered by :: NAllen LPN   Activities of  Daily Living    10/06/2023   12:45 PM  In your present state of health, do you have any difficulty performing the following activities:  Hearing? 0  Vision? 0  Difficulty concentrating or making decisions? 0  Walking or climbing stairs? 0  Dressing or bathing? 0  Doing errands, shopping? 0  Preparing Food and eating ? N  Using the Toilet? N  In the past six months, have you accidently leaked urine? N  Do you have problems with loss of bowel control? N  Managing your Medications? N  Managing your Finances? N  Housekeeping or managing your Housekeeping? N    Patient Care Team: Tysinger, Kermit Balo, PA-C as PCP - General (Family Medicine)  Indicate any recent Medical Services you may have received from other than [facility] providers in the past year (date may be approximate).     Assessment:   This is a routine wellness examination for [place].  Hearing/Vision screen Hearing Screening - Comments:: Denies hearing issues Vision Screening - Comments:: Regular eye exams, [facility]   Goals Addressed             This Visit's Progress    Patient Stated       10/07/2023, denies goals       Depression Screen    10/07/2023   10:18 AM 10/01/2022    9:25 AM 06/28/2022   11:05 AM 03/07/2022   10:10 AM 09/28/2021    2:19 PM 09/24/2021    2:12 PM 03/28/2021    9:13 AM  PHQ 2/9 Scores  PHQ - 2 Score 0 0 0 0 0 0 0    Fall Risk    10/06/2023   12:45 PM 10/01/2022    9:22 AM 06/28/2022   11:04 AM 03/07/2022   10:10 AM 09/28/2021    2:19 PM  Fall Risk   Falls in the past year? 0 0 0 0 0  Number falls in past yr: 0 0 0 0   Injury with Fall? 0 0 0 0   Risk for fall due to : Medication side effect No Fall Risks No Fall Risks No Fall Risks Medication side effect  Follow  up Falls prevention discussed;Falls evaluation completed Education provided;Falls prevention discussed Falls evaluation completed Falls evaluation completed Falls evaluation completed;Education provided;Falls  prevention discussed    MEDICARE RISK AT HOME: Medicare Risk at Home Any stairs in or around the home?: (Patient-Rptd) No If so, are there any without handrails?: (Patient-Rptd) No Home free of loose throw rugs in walkways, pet beds, electrical cords, etc?: (Patient-Rptd) Yes Adequate lighting in your home to reduce risk of falls?: (Patient-Rptd) Yes Life alert?: (Patient-Rptd) No Use of a cane, walker or w/c?: (Patient-Rptd) No Grab bars in the bathroom?: (Patient-Rptd) Yes Shower chair or bench in shower?: (Patient-Rptd) No Elevated toilet seat or a handicapped toilet?: (Patient-Rptd) Yes  TIMED UP AND GO:  Was the test performed?  No    Cognitive Function:        10/07/2023   10:19 AM 10/01/2022    9:32 AM 09/28/2021    2:21 PM  6CIT Screen  What Year? 0 points 0 points 0 points  What month? 0 points 0 points 0 points  What time? 0 points 0 points 0 points  Count back from 20 0 points 0 points 0 points  Months in reverse 0 points 0 points 0 points  Repeat phrase 0 points 0 points 2 points  Total Score 0 points 0 points 2 points    Immunizations Immunization History  Administered Date(s) Administered   Fluad Quad(high Dose 65+) 06/30/2019, 06/28/2022, 06/10/2023   Influenza Split 07/07/2007, 06/01/2014, 06/05/2015   Influenza, High Dose Seasonal PF 06/27/2021   Influenza-Unspecified 07/31/2016, 06/16/2017, 05/09/2018   Moderna Sars-Covid-2 Vaccination 04/11/2020, 05/11/2020, 05/03/2021   PPD Test 09/12/2014    TDAP status: Due, Education has been provided regarding the importance of this vaccine. Advised may receive this vaccine at local pharmacy or Health Dept. Aware to provide a copy of the vaccination record if obtained from local pharmacy or Health Dept. Verbalized acceptance and understanding.  Flu Vaccine status: Up to date  Pneumococcal vaccine status: Due, Education has been provided regarding the importance of this vaccine. Advised may receive this vaccine  at local pharmacy or Health Dept. Aware to provide a copy of the vaccination record if obtained from local pharmacy or Health Dept. Verbalized acceptance and understanding.  Covid-19 vaccine status: Information provided on how to obtain vaccines.   Qualifies for Shingles Vaccine? Yes   Zostavax completed No   Shingrix Completed?: No.    Education has been provided regarding the importance of this vaccine. Patient has been advised to call insurance company to determine out of pocket expense if they have not yet received this vaccine. Advised may also receive vaccine at local pharmacy or Health Dept. Verbalized acceptance and understanding.  Screening Tests Health Maintenance  Topic Date Due   Pneumonia Vaccine 55+ Years old (1 of 2 - PCV) Never done   DTaP/Tdap/Td (1 - Tdap) Never done   Lung Cancer Screening  Never done   Zoster Vaccines- Shingrix (1 of 2) Never done   COVID-19 Vaccine (4 - 2024-25 season) 05/11/2023   Medicare Annual Wellness (AWV)  10/06/2024   Colonoscopy  04/15/2026   INFLUENZA VACCINE  Completed   DEXA SCAN  Completed   Hepatitis C Screening  Completed   HPV VACCINES  Aged Out    Health Maintenance  Health Maintenance Due  Topic Date Due   Pneumonia Vaccine 5+ Years old (1 of 2 - PCV) Never done   DTaP/Tdap/Td (1 - Tdap) Never done   Lung Cancer Screening  Never done  Zoster Vaccines- Shingrix (1 of 2) Never done   COVID-19 Vaccine (4 - 2024-25 season) 05/11/2023    Colorectal cancer screening: Type of screening: Colonoscopy. Completed 04/15/2016. Repeat every 10 years  Mammogram status: Completed 12/20/2022. Repeat every year  Bone Density status: Completed 12/20/2022.   Lung Cancer Screening: (Low Dose CT Chest recommended if Age 1-80 years, 20 pack-year currently smoking OR have quit w/in 15years.) does not qualify.   Lung Cancer Screening Referral: declines  Additional Screening:  Hepatitis C Screening: does qualify; Completed 02/13/2018  Vision  Screening: Recommended annual ophthalmology exams for early detection of glaucoma and other disorders of the eye. Is the patient up to date with their annual eye exam?  No  Who is the provider or what is the name of the office in which the patient attends annual eye exams? [facility] If pt is not established with a provider, would they like to be referred to a provider to establish care? No .   Dental Screening: Recommended annual dental exams for proper oral hygiene  Diabetic Foot Exam: n/a  Community Resource Referral / Chronic Care Management: CRR required this visit?  No   CCM required this visit?  No     Plan:     I have personally reviewed and noted the following in the patient's chart:   Medical and social history Use of alcohol, tobacco or illicit drugs  Current medications and supplements including opioid prescriptions. Patient is not currently taking opioid prescriptions. Functional ability and status Nutritional status Physical activity Advanced directives List of other physicians Hospitalizations, surgeries, and ER visits in previous 12 months Vitals Screenings to include cognitive, depression, and falls Referrals and appointments  In addition, I have reviewed and discussed with patient certain preventive protocols, quality metrics, and best practice recommendations. A written personalized care plan for preventive services as well as general preventive health recommendations were provided to patient.     Barb Merino, LPN   1/61/0960   After Visit Summary: (MyChart) Due to this being a telephonic visit, the after visit summary with patients personalized plan was offered to patient via MyChart   Nurse Notes: none

## 2023-10-07 NOTE — Patient Instructions (Signed)
Ms. Perras , Thank you for taking time to come for your Medicare Wellness Visit. I appreciate your ongoing commitment to your health goals. Please review the following plan we discussed and let me know if I can assist you in the future.   Referrals/Orders/Follow-Ups/Clinician Recommendations: none  This is a list of the screening recommended for you and due dates:  Health Maintenance  Topic Date Due   Pneumonia Vaccine (1 of 2 - PCV) Never done   DTaP/Tdap/Td vaccine (1 - Tdap) Never done   Screening for Lung Cancer  Never done   Zoster (Shingles) Vaccine (1 of 2) Never done   COVID-19 Vaccine (4 - 2024-25 season) 05/11/2023   Medicare Annual Wellness Visit  10/06/2024   Colon Cancer Screening  04/15/2026   Flu Shot  Completed   DEXA scan (bone density measurement)  Completed   Hepatitis C Screening  Completed   HPV Vaccine  Aged Out    Advanced directives: (In Chart) A copy of your advanced directives are scanned into your chart should your provider ever need it.  Next Medicare Annual Wellness Visit scheduled for next year: Yes  insert Preventive Care attachment Insert FALL PREVENTION attachment if needed

## 2023-10-29 ENCOUNTER — Ambulatory Visit: Payer: Medicare PPO | Admitting: Orthopedic Surgery

## 2023-11-10 ENCOUNTER — Ambulatory Visit (INDEPENDENT_AMBULATORY_CARE_PROVIDER_SITE_OTHER): Payer: Medicare PPO | Admitting: Medical

## 2023-11-10 ENCOUNTER — Encounter: Payer: Self-pay | Admitting: Medical

## 2023-11-10 VITALS — BP 120/70 | HR 73 | Ht 64.0 in | Wt 164.0 lb

## 2023-11-10 DIAGNOSIS — Z122 Encounter for screening for malignant neoplasm of respiratory organs: Secondary | ICD-10-CM

## 2023-11-10 DIAGNOSIS — Z2820 Immunization not carried out because of patient decision for unspecified reason: Secondary | ICD-10-CM | POA: Insufficient documentation

## 2023-11-10 DIAGNOSIS — M810 Age-related osteoporosis without current pathological fracture: Secondary | ICD-10-CM

## 2023-11-10 DIAGNOSIS — Z0000 Encounter for general adult medical examination without abnormal findings: Secondary | ICD-10-CM

## 2023-11-10 DIAGNOSIS — Z1389 Encounter for screening for other disorder: Secondary | ICD-10-CM

## 2023-11-10 DIAGNOSIS — R7301 Impaired fasting glucose: Secondary | ICD-10-CM

## 2023-11-10 DIAGNOSIS — Z7185 Encounter for immunization safety counseling: Secondary | ICD-10-CM

## 2023-11-10 DIAGNOSIS — E559 Vitamin D deficiency, unspecified: Secondary | ICD-10-CM

## 2023-11-10 DIAGNOSIS — Z90710 Acquired absence of both cervix and uterus: Secondary | ICD-10-CM

## 2023-11-10 DIAGNOSIS — L84 Corns and callosities: Secondary | ICD-10-CM | POA: Insufficient documentation

## 2023-11-10 DIAGNOSIS — E782 Mixed hyperlipidemia: Secondary | ICD-10-CM

## 2023-11-10 DIAGNOSIS — Z1211 Encounter for screening for malignant neoplasm of colon: Secondary | ICD-10-CM

## 2023-11-10 DIAGNOSIS — I10 Essential (primary) hypertension: Secondary | ICD-10-CM

## 2023-11-10 DIAGNOSIS — R3129 Other microscopic hematuria: Secondary | ICD-10-CM | POA: Diagnosis not present

## 2023-11-10 DIAGNOSIS — M79604 Pain in right leg: Secondary | ICD-10-CM

## 2023-11-10 DIAGNOSIS — I739 Peripheral vascular disease, unspecified: Secondary | ICD-10-CM

## 2023-11-10 DIAGNOSIS — I8393 Asymptomatic varicose veins of bilateral lower extremities: Secondary | ICD-10-CM | POA: Diagnosis not present

## 2023-11-10 DIAGNOSIS — G8929 Other chronic pain: Secondary | ICD-10-CM | POA: Insufficient documentation

## 2023-11-10 LAB — POCT URINALYSIS DIP (PROADVANTAGE DEVICE)
Bilirubin, UA: NEGATIVE
Glucose, UA: NEGATIVE mg/dL
Ketones, POC UA: NEGATIVE mg/dL
Leukocytes, UA: NEGATIVE
Nitrite, UA: NEGATIVE
Protein Ur, POC: NEGATIVE mg/dL
Specific Gravity, Urine: 1.02
Urobilinogen, Ur: NEGATIVE
pH, UA: 6 (ref 5.0–8.0)

## 2023-11-10 LAB — URINALYSIS, MICROSCOPIC ONLY
Casts: NONE SEEN /LPF
Epithelial Cells (non renal): >10 /HPF — AB (ref 0–10)

## 2023-11-10 LAB — LIPID PANEL

## 2023-11-10 NOTE — Progress Notes (Signed)
 Subjective:   HPI  Alyssa Carter is a 78 y.o. female who presents for Chief Complaint  Patient presents with   Annual Exam    Fasting cpe, right pinky toe- has spot on it, declines all vaccines    Patient Care Team: Lareen Mullings, Cleda Mccreedy as PCP - General (Family Medicine) August Saucer Corrie Mckusick, MD as Consulting Physician (Orthopedic Surgery)   Concerns: Overall doing okay.  Compliant with medications. She complains of right pinky toe with a callus that is tender.  She has been trying over-the-counter callus pads but they keep falling off.  Hyperlipidemia-compliant with medication without complaint  Hypertension-compliant with medication without complaint  Chronic leg pain-she continues on gabapentin  Osteoporosis-not currently on any specific medicine.  She declined this last year  She is compliant with vitamin D supplement   Reviewed their medical, surgical, family, social, medication, and allergy history and updated chart as appropriate.  Allergies  Allergen Reactions   Aspirin     constipation    Past Medical History:  Diagnosis Date   Bilateral leg pain    Dyslipidemia    Full dentures    H/O bone density study 04/2013   never   H/O mammogram 2010   Hematuria    microscopic, several prior evaluations, no source or cause found   Hyperlipidemia    Hypertension    Impaired fasting blood sugar 2008   Influenza vaccine side effect    intolerance, declines   Pre-diabetes    Tobacco use    Wears glasses     Current Outpatient Medications on File Prior to Visit  Medication Sig Dispense Refill   Cholecalciferol (VITAMIN D) 50 MCG (2000 UT) CAPS Take 1 capsule (2,000 Units total) by mouth daily. 90 capsule 3   gabapentin (NEURONTIN) 300 MG capsule TAKE 2 CAPSULES AT BEDTIME 180 capsule 0   lisinopril (ZESTRIL) 20 MG tablet TAKE 1 TABLET EVERY DAY 90 tablet 0   Multiple Vitamin (MULTIVITAMIN WITH MINERALS) TABS tablet Take 1 tablet by mouth daily.      Omega-3 Fatty Acids (FISH OIL PO) Take 2 capsules by mouth daily.     rosuvastatin (CRESTOR) 40 MG tablet TAKE 1 TABLET EVERY DAY 90 tablet 0   No current facility-administered medications on file prior to visit.      Current Outpatient Medications:    Cholecalciferol (VITAMIN D) 50 MCG (2000 UT) CAPS, Take 1 capsule (2,000 Units total) by mouth daily., Disp: 90 capsule, Rfl: 3   gabapentin (NEURONTIN) 300 MG capsule, TAKE 2 CAPSULES AT BEDTIME, Disp: 180 capsule, Rfl: 0   lisinopril (ZESTRIL) 20 MG tablet, TAKE 1 TABLET EVERY DAY, Disp: 90 tablet, Rfl: 0   Multiple Vitamin (MULTIVITAMIN WITH MINERALS) TABS tablet, Take 1 tablet by mouth daily., Disp: , Rfl:    Omega-3 Fatty Acids (FISH OIL PO), Take 2 capsules by mouth daily., Disp: , Rfl:    rosuvastatin (CRESTOR) 40 MG tablet, TAKE 1 TABLET EVERY DAY, Disp: 90 tablet, Rfl: 0  Family History  Problem Relation Age of Onset   Alzheimer's disease Mother    Other Mother        died of UTI, dehydration   Other Father 47       failure to thrive, old age, fall and rib fracture   Diabetes Father    Diabetes Sister    Cancer Brother        brain, stomach   Colon cancer Neg Hx     Past Surgical History:  Procedure Laterality Date   ABDOMINAL HYSTERECTOMY  1980   total; due to heavy bleeding   APPENDECTOMY  1980   KNEE SURGERY     right   SHOULDER ARTHROSCOPY WITH SUBACROMIAL DECOMPRESSION, ROTATOR CUFF REPAIR AND BICEP TENDON REPAIR Left 08/20/2022   Procedure: left shoulder arthroscopy, debridement, biceps tenodesis, mini open rotator cuff repair;  Surgeon: Cammy Copa, MD;  Location: [facility];  Service: Orthopedics;  Laterality: Left;   SHOULDER SURGERY Right    rotator cuff   SPINE SURGERY     lumbar   TONSILLECTOMY     TUBAL LIGATION     Review of Systems  Constitutional:  Negative for chills, fever, malaise/fatigue and weight loss.  HENT:  Negative for congestion, ear pain, hearing loss, sore throat and tinnitus.    Eyes:  Negative for blurred vision, pain and redness.  Respiratory:  Negative for cough, hemoptysis and shortness of breath.   Cardiovascular:  Negative for chest pain, palpitations, orthopnea, claudication and leg swelling.  Gastrointestinal:  Negative for abdominal pain, blood in stool, constipation, diarrhea, nausea and vomiting.  Genitourinary:  Negative for dysuria, flank pain, frequency, hematuria and urgency.  Musculoskeletal:  Positive for myalgias. Negative for falls and joint pain.       Right great toe callus, chronic leg pains bilat  Skin:  Negative for itching and rash.  Neurological:  Negative for dizziness, tingling, speech change, weakness and headaches.  Endo/Heme/Allergies:  Negative for polydipsia. Does not bruise/bleed easily.  Psychiatric/Behavioral:  Negative for depression and memory loss. The patient is not nervous/anxious and does not have insomnia.          Objective:  BP 120/70   Pulse 73   Ht 5\' 4"  (1.626 m)   Wt 164 lb (74.4 kg)   BMI 28.15 kg/m   General appearance: alert, no distress, WD/WN, Caucasian female Skin: Scattered macules, no worrisome lesions HEENT: normocephalic, conjunctiva/corneas normal, sclerae anicteric, PERRLA, EOMi, nares patent, no discharge or erythema, pharynx normal Oral cavity: MMM, tongue normal, teeth in good repair Neck: supple, no lymphadenopathy, no thyromegaly, no masses, normal ROM, no bruits Chest: non tender, normal shape and expansion Heart: RRR, normal S1, S2, no murmurs Lungs: Decreased lung sounds in general no wheezes, rhonchi, or rales Abdomen: +bs, soft, non tender, non distended, no masses, no hepatomegaly, no splenomegaly, no bruits Back: Lumbar surgical scar noted, non tender, normal ROM, no scoliosis Musculoskeletal: upper extremities non tender, no obvious deformity, normal ROM throughout, lower extremities non tender, no obvious deformity, normal ROM throughout Extremities: no edema, no cyanosis, no  clubbing, purplish colored hue to the toes but normal cap refill, there are mild to moderate varicose veins in both lower legs, right small toe laterally over the DIP with a thickened tender callus Pulses: 2+ symmetric, upper and lower extremities, normal cap refill Neurological: alert, oriented x 3, CN2-12 intact, strength normal upper extremities and lower extremities, sensation normal throughout, DTRs 2+ throughout, no cerebellar signs, gait normal Psychiatric: normal affect, behavior normal, pleasant  Breast/gyn/rectal - declined    Assessment and Plan :   Encounter Diagnoses  Name Primary?   Encounter for health maintenance examination in adult Yes   Vitamin D deficiency    Varicose veins of both lower extremities, unspecified whether complicated    Vaccine counseling    Screening for lung cancer    Screening for hematuria or proteinuria    S/P hysterectomy    PVD (peripheral vascular disease) (HCC)    Osteoporosis  without current pathological fracture, unspecified osteoporosis type    Mixed dyslipidemia    Impaired fasting blood sugar    Essential hypertension    Callus of toe    Chronic pain of both lower extremities    Vaccine refused by patient    Screening for colon cancer      This visit was a preventative care visit, also known as wellness visit or routine physical.   Topics typically include healthy lifestyle, diet, exercise, preventative care, vaccinations, sick and well care, proper use of emergency dept and after hours care, as well as other concerns.    Separate significant issues discussed: Hypertension-continue lisinopril 20 mg daily  Hyperlipidemia-continue Crestor rosuvastatin 40 mg daily and fish oil over-the-counter  Vitamin D deficiency-continue vitamin D supplement currently 2000 units daily  Osteoporosis-I recommend weightbearing exercise on 2 days a week and I recommend you taking some type of supplement to reduce the risk of bone breakdown or to  help build bone up.  There are different medications on the market.  He had declined these previously.  I strongly recommend a trial of weekly Fosamax or an injectable medication that can help improve on this.  Let me know if agreeable to treatment.  Continue vitamin D supplement and get at least 1200 mg of calcium through diet or supplement  Callus of toe-referral to podiatry.  In the meantime you can use corn pads that adhered to either skin and wrapped your foot and toe with Coban bandage to help the pad stick to your foot.  Avoid tight fitting shoes  Varicose veins-no recent issues.  Tobacco use-she is smoking every now and then but thankfully is cut down most of her tobacco use.  I strongly recommend a CT chest lung cancer screening   General Recommendations: Continue to return yearly for your annual wellness and preventative care visits.  This gives [place] a chance to discuss healthy lifestyle, exercise, vaccinations, review your chart record, and perform screenings where appropriate.  I recommend you see your eye doctor yearly for routine vision care.  I recommend you see your dentist yearly for routine dental care including hygiene visits twice yearly.   Vaccination recommendations were reviewed Immunization History  Administered Date(s) Administered   Fluad Quad(high Dose 65+) 06/30/2019, 06/28/2022, 06/10/2023   Influenza Split 07/07/2007, 06/01/2014, 06/05/2015   Influenza, High Dose Seasonal PF 06/27/2021   Influenza-Unspecified 07/31/2016, 06/16/2017, 05/09/2018   Moderna Sars-Covid-2 Vaccination 04/11/2020, 05/11/2020, 05/03/2021   PPD Test 09/12/2014    Vaccine recommendations: I recommend Shingrix vaccine I recommend tetanus boosted I recommend yearly flu shot I recommend an updated pneumococcal pneumonia vaccine I recommend RSV vaccine  You declined all vaccines today   Screening for cancer: Colon cancer screening: You are past due for colonoscopy.  I will refer  you back to Dr. Myrtie Neither   Breast cancer screening: You should perform a self breast exam monthly.   We reviewed recommendations for regular mammograms and breast cancer screening. Last mammogram: up to date.  The USPSTF concludes that the current evidence is insufficient to assess the balance of benefits and harms of screening mammography in women 75 years or older.  Cervical cancer screening: We reviewed recommendations for pap smear screening. Last pap .  You have had a prior hysterectomy   Skin cancer screening: Check your skin regularly for new changes, growing lesions, or other lesions of concern Come in for evaluation if you have skin lesions of concern. Consider baseline dermatology consult for skin surveillance.  If you want to pursue this I can put in a referral  Lung cancer screening: If you have a greater than 20 pack year history of tobacco use, then you may qualify for lung cancer screening with a chest CT scan.   Please call your insurance company to inquire about coverage for this test.  I strongly recommend a lung cancer CT skin screening.  Let me know if you are agreeable to me placing this order  Pancreatic cancer: no current screening test is available routinely recommended.  (Risk factors: Smoking, overweight or obese, diabetes, chronic pancreatitis, work Nurse, mental health, Solicitor, 52 year old or greater, female greater than female, African-American, family history of pancreatic cancer, hereditary breast, ovarian, melanoma, Lynch, Peutz-jeghers).  We currently don't have screenings for other cancers besides breast, cervical, colon, and lung cancers.  If you have a strong family history of cancer or have other cancer screening concerns, please let me know.    Bone health: Get at least 150 minutes of aerobic exercise weekly Get weight bearing exercise at least once weekly Bone density test:  A bone density test is an imaging test that uses a type of X-ray to  measure the amount of calcium and other minerals in your bones. The test may be used to diagnose or screen you for a condition that causes weak or thin bones (osteoporosis), predict your risk for a broken bone (fracture), or determine how well your osteoporosis treatment is working. The bone density test is recommended for females 65 and older, or females or males <65 if certain risk factors such as thyroid disease, long term use of steroids such as for asthma or rheumatological issues, vitamin D deficiency, estrogen deficiency, family history of osteoporosis, self or family history of fragility fracture in first degree relative.  You have osteoporosis per your 2024 bone density test  Heart health: Get at least 150 minutes of aerobic exercise weekly Limit alcohol It is important to maintain a healthy blood pressure and healthy cholesterol numbers  Heart disease screening: Screening for heart disease includes screening for blood pressure, fasting lipids, glucose/diabetes screening, BMI height to weight ratio, reviewed of smoking status, physical activity, and diet.    Goals include blood pressure 120/80 or less, maintaining a healthy lipid/cholesterol profile, preventing diabetes or keeping diabetes numbers under good control, not smoking or using tobacco products, exercising most days per week or at least 150 minutes per week of exercise, and eating healthy variety of fruits and vegetables, healthy oils, and avoiding unhealthy food choices like fried food, fast food, high sugar and high cholesterol foods.    I recommend a CT coronary calcium test or chest CT to further evaluate your heart and lungs.  Let me know if agreeable for this   Vascular disease screening: For high risk individuals including smokers, diabetes, patients with known heart disease or high blood pressure, kidney disease, and others, screening for vascular disease or atherosclerosis of the arteries is available.  Examples may  include carotid ultrasound, abdominal aortic ultrasound, ABI blood flow screening in the legs, thoracic aorta screening.  You had a blood flow screen in your legs in April 2023 that did not show significant blockage   Medical care options: I recommend you continue to seek care here first for routine care.  We try really hard to have available appointments Monday through Friday daytime hours for sick visits, acute visits, and physicals.  Urgent care should be used for after hours and weekends for significant issues that cannot wait till  the next day.  The emergency department should be used for significant potentially life-threatening emergencies.  The emergency department is expensive, can often have long wait times for less significant concerns, so try to utilize primary care, urgent care, or telemedicine when possible to avoid unnecessary trips to the emergency department.  Virtual visits and telemedicine have been introduced since the pandemic started in 2020, and can be convenient ways to receive medical care.  We offer virtual appointments as well to assist you in a variety of options to seek medical care.   Legal Take the time to do a Last Will and Testament, advanced directives including Healthcare Power of Attorney and Living Will documents.  Do not leave your family with burdens that can be handled ahead of time.   Advanced Directives: I recommend you consider completing a Health Care Power of Attorney and Living Will.   These documents respect your wishes and help alleviate burdens on your loved ones if you were to become terminally ill or be in a position to need those documents enforced.    You can complete Advanced Directives yourself, have them notarized, then have copies made for our office, for you and for anybody you feel should have them in safe keeping.  Or, you can have an attorney prepare these documents.   If you haven't updated your Last Will and Testament in a while, it may  be worthwhile having an attorney prepare these documents together and save on some costs.       Spiritual and Emotional Health Keeping a healthy spiritual life can help you better manage your physical health. Your spiritual life can help you to cope with any issues that may arise with your physical health.  Balance can keep [place] healthy and help [place] to recover.  If you are struggling with your spiritual health there are questions that you may want to ask yourself:  What makes me feel most complete? When do I feel most connected to the rest of the world? Where do I find the most inner strength? What am I doing when I feel whole?  Helpful tips: Being in nature. Some people feel very connected and at peace when they are walking outdoors or are outside. Helping others. Some feel the largest sense of wellbeing when they are of service to others. Being of service can take on many forms. It can be doing volunteer work, being kind to strangers, or offering a hand to a friend in need. Gratitude. Some people find they feel the most connected when they remain grateful. They may make lists of all the things they are grateful for or say a thank you out loud for all they have.    Emotional Health Are you in tune with your emotional health?  Check out this link: http://www.marquez-love.com/   Financial Health Make sure you use a budget for your personal finances Make sure you are insured against risks (health insurance, life insurance, auto insurance, etc) Save more, spend less Set financial goals If you need help in this area, good resources include counseling through [employer] or other community resources, have a meeting with a Social research officer, government, and a good resource is Salley Slaughter podcast    Teauna was seen today for annual exam.  Diagnoses and all orders for this visit:  Encounter for health maintenance examination in adult -     Comprehensive metabolic panel -      CBC -     Lipid panel -  Hemoglobin A1c -     TSH -     VITAMIN D 25 Hydroxy (Vit-D Deficiency, Fractures) -     Ambulatory referral to Gastroenterology  Vitamin D deficiency -     VITAMIN D 25 Hydroxy (Vit-D Deficiency, Fractures)  Varicose veins of both lower extremities, unspecified whether complicated  Vaccine counseling  Screening for lung cancer  Screening for hematuria or proteinuria  S/P hysterectomy  PVD (peripheral vascular disease) (HCC)  Osteoporosis without current pathological fracture, unspecified osteoporosis type -     TSH  Mixed dyslipidemia -     Lipid panel  Impaired fasting blood sugar -     Hemoglobin A1c  Essential hypertension -     TSH  Callus of toe -     Ambulatory referral to Podiatry  Chronic pain of both lower extremities  Vaccine refused by patient  Screening for colon cancer -     Ambulatory referral to Gastroenterology     Follow-up pending labs, yearly for physical

## 2023-11-10 NOTE — Progress Notes (Signed)
 Send for urine microscopic given the microscopic hematuria

## 2023-11-10 NOTE — Patient Instructions (Signed)
 This visit was a preventative care visit, also known as wellness visit or routine physical.   Topics typically include healthy lifestyle, diet, exercise, preventative care, vaccinations, sick and well care, proper use of emergency dept and after hours care, as well as other concerns.    Separate significant issues discussed: Hypertension-continue lisinopril 20 mg daily  Hyperlipidemia-continue Crestor rosuvastatin 40 mg daily and fish oil over-the-counter  Vitamin D deficiency-continue vitamin D supplement currently 2000 units daily  Osteoporosis-I recommend weightbearing exercise on 2 days a week and I recommend you taking some type of supplement to reduce the risk of bone breakdown or to help build bone up.  There are different medications on the market.  He had declined these previously.  I strongly recommend a trial of weekly Fosamax or an injectable medication that can help improve on this.  Let me know if agreeable to treatment.  Continue vitamin D supplement and get at least 1200 mg of calcium through diet or supplement  Callus of toe-referral to podiatry.  In the meantime you can use corn pads that adhered to either skin and wrapped your foot and toe with Coban bandage to help the pad stick to your foot.  Avoid tight fitting shoes  Varicose veins-no recent issues.  Tobacco use-she is smoking every now and then but thankfully is cut down most of her tobacco use.  I strongly recommend a CT chest lung cancer screening   General Recommendations: Continue to return yearly for your annual wellness and preventative care visits.  This gives [place] a chance to discuss healthy lifestyle, exercise, vaccinations, review your chart record, and perform screenings where appropriate.  I recommend you see your eye doctor yearly for routine vision care.  I recommend you see your dentist yearly for routine dental care including hygiene visits twice yearly.   Vaccination recommendations were  reviewed Immunization History  Administered Date(s) Administered   Fluad Quad(high Dose 65+) 06/30/2019, 06/28/2022, 06/10/2023   Influenza Split 07/07/2007, 06/01/2014, 06/05/2015   Influenza, High Dose Seasonal PF 06/27/2021   Influenza-Unspecified 07/31/2016, 06/16/2017, 05/09/2018   Moderna Sars-Covid-2 Vaccination 04/11/2020, 05/11/2020, 05/03/2021   PPD Test 09/12/2014    Vaccine recommendations: I recommend Shingrix vaccine I recommend tetanus boosted I recommend yearly flu shot I recommend an updated pneumococcal pneumonia vaccine I recommend RSV vaccine  You declined all vaccines today   Screening for cancer: Colon cancer screening: You are past due for colonoscopy.  I will refer you back to Dr. Myrtie Neither   Breast cancer screening: You should perform a self breast exam monthly.   We reviewed recommendations for regular mammograms and breast cancer screening. Last mammogram: up to date.  The USPSTF concludes that the current evidence is insufficient to assess the balance of benefits and harms of screening mammography in women 75 years or older.  Cervical cancer screening: We reviewed recommendations for pap smear screening. Last pap .  You have had a prior hysterectomy   Skin cancer screening: Check your skin regularly for new changes, growing lesions, or other lesions of concern Come in for evaluation if you have skin lesions of concern. Consider baseline dermatology consult for skin surveillance.  If you want to pursue this I can put in a referral  Lung cancer screening: If you have a greater than 20 pack year history of tobacco use, then you may qualify for lung cancer screening with a chest CT scan.   Please call your insurance company to inquire about coverage for this test.  I strongly recommend a lung cancer CT skin screening.  Let me know if you are agreeable to me placing this order  Pancreatic cancer: no current screening test is available routinely  recommended.  (Risk factors: Smoking, overweight or obese, diabetes, chronic pancreatitis, work Nurse, mental health, Solicitor, 39 year old or greater, female greater than female, African-American, family history of pancreatic cancer, hereditary breast, ovarian, melanoma, Lynch, Peutz-jeghers).  We currently don't have screenings for other cancers besides breast, cervical, colon, and lung cancers.  If you have a strong family history of cancer or have other cancer screening concerns, please let me know.    Bone health: Get at least 150 minutes of aerobic exercise weekly Get weight bearing exercise at least once weekly Bone density test:  A bone density test is an imaging test that uses a type of X-ray to measure the amount of calcium and other minerals in your bones. The test may be used to diagnose or screen you for a condition that causes weak or thin bones (osteoporosis), predict your risk for a broken bone (fracture), or determine how well your osteoporosis treatment is working. The bone density test is recommended for females 65 and older, or females or males <65 if certain risk factors such as thyroid disease, long term use of steroids such as for asthma or rheumatological issues, vitamin D deficiency, estrogen deficiency, family history of osteoporosis, self or family history of fragility fracture in first degree relative.  You have osteoporosis per your 2024 bone density test  Heart health: Get at least 150 minutes of aerobic exercise weekly Limit alcohol It is important to maintain a healthy blood pressure and healthy cholesterol numbers  Heart disease screening: Screening for heart disease includes screening for blood pressure, fasting lipids, glucose/diabetes screening, BMI height to weight ratio, reviewed of smoking status, physical activity, and diet.    Goals include blood pressure 120/80 or less, maintaining a healthy lipid/cholesterol profile, preventing diabetes or keeping  diabetes numbers under good control, not smoking or using tobacco products, exercising most days per week or at least 150 minutes per week of exercise, and eating healthy variety of fruits and vegetables, healthy oils, and avoiding unhealthy food choices like fried food, fast food, high sugar and high cholesterol foods.    I recommend a CT coronary calcium test or chest CT to further evaluate your heart and lungs.  Let me know if agreeable for this   Vascular disease screening: For high risk individuals including smokers, diabetes, patients with known heart disease or high blood pressure, kidney disease, and others, screening for vascular disease or atherosclerosis of the arteries is available.  Examples may include carotid ultrasound, abdominal aortic ultrasound, ABI blood flow screening in the legs, thoracic aorta screening.  You had a blood flow screen in your legs in April 2023 that did not show significant blockage   Medical care options: I recommend you continue to seek care here first for routine care.  We try really hard to have available appointments Monday through Friday daytime hours for sick visits, acute visits, and physicals.  Urgent care should be used for after hours and weekends for significant issues that cannot wait till the next day.  The emergency department should be used for significant potentially life-threatening emergencies.  The emergency department is expensive, can often have long wait times for less significant concerns, so try to utilize primary care, urgent care, or telemedicine when possible to avoid unnecessary trips to the emergency department.  Virtual visits and telemedicine have  been introduced since the pandemic started in 2020, and can be convenient ways to receive medical care.  We offer virtual appointments as well to assist you in a variety of options to seek medical care.   Legal Take the time to do a Last Will and Testament, advanced directives including  Healthcare Power of Attorney and Living Will documents.  Do not leave your family with burdens that can be handled ahead of time.   Advanced Directives: I recommend you consider completing a Health Care Power of Attorney and Living Will.   These documents respect your wishes and help alleviate burdens on your loved ones if you were to become terminally ill or be in a position to need those documents enforced.    You can complete Advanced Directives yourself, have them notarized, then have copies made for our office, for you and for anybody you feel should have them in safe keeping.  Or, you can have an attorney prepare these documents.   If you haven't updated your Last Will and Testament in a while, it may be worthwhile having an attorney prepare these documents together and save on some costs.       Spiritual and Emotional Health Keeping a healthy spiritual life can help you better manage your physical health. Your spiritual life can help you to cope with any issues that may arise with your physical health.  Balance can keep [place] healthy and help [place] to recover.  If you are struggling with your spiritual health there are questions that you may want to ask yourself:  What makes me feel most complete? When do I feel most connected to the rest of the world? Where do I find the most inner strength? What am I doing when I feel whole?  Helpful tips: Being in nature. Some people feel very connected and at peace when they are walking outdoors or are outside. Helping others. Some feel the largest sense of wellbeing when they are of service to others. Being of service can take on many forms. It can be doing volunteer work, being kind to strangers, or offering a hand to a friend in need. Gratitude. Some people find they feel the most connected when they remain grateful. They may make lists of all the things they are grateful for or say a thank you out loud for all they have.    Emotional Health Are  you in tune with your emotional health?  Check out this link: http://www.marquez-love.com/   Financial Health Make sure you use a budget for your personal finances Make sure you are insured against risks (health insurance, life insurance, auto insurance, etc) Save more, spend less Set financial goals If you need help in this area, good resources include counseling through [employer] or other community resources, have a meeting with a Social research officer, government, and a good resource is [employer]

## 2023-11-10 NOTE — Addendum Note (Signed)
 Addended by: Herminio Commons A on: 11/10/2023 10:18 AM   Modules accepted: Orders

## 2023-11-11 LAB — COMPREHENSIVE METABOLIC PANEL
ALT: 21 IU/L (ref 0–32)
AST: 20 IU/L (ref 0–40)
Albumin: 4.8 g/dL (ref 3.8–4.8)
Alkaline Phosphatase: 87 IU/L (ref 44–121)
BUN/Creatinine Ratio: 23 (ref 12–28)
BUN: 22 mg/dL (ref 8–27)
Bilirubin Total: 0.4 mg/dL (ref 0.0–1.2)
CO2: 22 mmol/L (ref 20–29)
Calcium: 10.3 mg/dL (ref 8.7–10.3)
Chloride: 101 mmol/L (ref 96–106)
Creatinine, Ser: 0.96 mg/dL (ref 0.57–1.00)
Globulin, Total: 2.5 g/dL (ref 1.5–4.5)
Glucose: 95 mg/dL (ref 70–99)
Potassium: 4.9 mmol/L (ref 3.5–5.2)
Sodium: 141 mmol/L (ref 134–144)
Total Protein: 7.3 g/dL (ref 6.0–8.5)
eGFR: 62 mL/min/{1.73_m2} (ref >59)

## 2023-11-11 LAB — TSH: TSH: 1.5 u[IU]/mL (ref 0.450–4.500)

## 2023-11-11 LAB — VITAMIN D 25 HYDROXY (VIT D DEFICIENCY, FRACTURES): Vit D, 25-Hydroxy: 46.1 ng/mL (ref 30.0–100.0)

## 2023-11-11 LAB — CBC
Hematocrit: 44.2 % (ref 34.0–46.6)
Hemoglobin: 14.6 g/dL (ref 11.1–15.9)
MCH: 31.5 pg (ref 26.6–33.0)
MCHC: 33 g/dL (ref 31.5–35.7)
MCV: 95 fL (ref 79–97)
Platelets: 260 10*3/uL (ref 150–450)
RBC: 4.64 x10E6/uL (ref 3.77–5.28)
RDW: 12.4 % (ref 11.7–15.4)
WBC: 5.9 10*3/uL (ref 3.4–10.8)

## 2023-11-11 LAB — HEMOGLOBIN A1C
Est. average glucose Bld gHb Est-mCnc: 120 mg/dL
Hgb A1c MFr Bld: 5.8 % — ABNORMAL HIGH (ref 4.8–5.6)

## 2023-11-11 LAB — LIPID PANEL
Cholesterol, Total: 129 mg/dL (ref 100–199)
HDL: 38 mg/dL — ABNORMAL LOW (ref >39)
LDL CALC COMMENT:: 3.4 ratio (ref 0.0–4.4)
LDL Chol Calc (NIH): 49 mg/dL (ref 0–99)
Triglycerides: 267 mg/dL — ABNORMAL HIGH (ref 0–149)
VLDL Cholesterol Cal: 42 mg/dL — ABNORMAL HIGH (ref 5–40)

## 2023-11-12 ENCOUNTER — Other Ambulatory Visit (INDEPENDENT_AMBULATORY_CARE_PROVIDER_SITE_OTHER): Payer: Self-pay

## 2023-11-12 ENCOUNTER — Ambulatory Visit (INDEPENDENT_AMBULATORY_CARE_PROVIDER_SITE_OTHER): Payer: Medicare PPO | Admitting: Orthopedic Surgery

## 2023-11-12 ENCOUNTER — Other Ambulatory Visit: Payer: Self-pay | Admitting: Medical

## 2023-11-12 DIAGNOSIS — M79604 Pain in right leg: Secondary | ICD-10-CM

## 2023-11-12 DIAGNOSIS — F17200 Nicotine dependence, unspecified, uncomplicated: Secondary | ICD-10-CM

## 2023-11-12 DIAGNOSIS — M25562 Pain in left knee: Secondary | ICD-10-CM

## 2023-11-12 DIAGNOSIS — R3129 Other microscopic hematuria: Secondary | ICD-10-CM

## 2023-11-12 MED ORDER — FENOFIBRATE 145 MG PO TABS: 145.0000 mg | ORAL_TABLET | Freq: Every day | ORAL | 2 refills | Status: DC

## 2023-11-12 MED ORDER — VITAMIN D 50 MCG (2000 UT) PO CAPS: 1.0000 | ORAL_CAPSULE | Freq: Every day | ORAL | 2 refills | Status: DC

## 2023-11-12 MED ORDER — ROSUVASTATIN CALCIUM 40 MG PO TABS
40.0000 mg | ORAL_TABLET | Freq: Every day | ORAL | 2 refills | Status: DC
Start: 2023-11-12 — End: 2023-12-05

## 2023-11-12 MED ORDER — LISINOPRIL 20 MG PO TABS: 20.0000 mg | ORAL_TABLET | Freq: Every day | ORAL | 2 refills | Status: DC

## 2023-11-12 NOTE — Progress Notes (Signed)
 Liver kidney electrolytes normal, vitamin D normal, thyroid normal, blood counts normal, diabetes marker stable but at risk for diabetes.  Triglycerides are too high.  I recommend adding fenofibrate to help lower your triglycerides.  This would be a once daily medication to add to your current regimen  I recommend referral to urology for microscopic blood in the urine.  I will place a urology referral.  Let me know if agreeable to the fenofibrate and the other recommendations we discussed at your physical

## 2023-11-13 ENCOUNTER — Encounter: Payer: Self-pay | Admitting: Orthopedic Surgery

## 2023-11-13 NOTE — Progress Notes (Signed)
 Office Visit Note   Patient: Alyssa Carter           Date of Birth: 12-29-1945           MRN: [id number] Visit Date: 11/12/2023 Requested by: Jac Canavan, PA-C [address] [place],  [place] [address] PCP: Jac Canavan, PA-C  Subjective: Chief Complaint  Patient presents with   Right Leg - Pain   Left Knee - Pain    HPI: Alyssa Carter is a 78 y.o. female who presents to the office reporting right leg pain.  Been ongoing for months but no history of injury.  She also reports some low back pain with radiating pain to the knee.  Denies any groin pain.  Patient does report numbness and tingling in her foot.  The pain does not wake her from sleep.  Patient also reports left knee pain which has been somewhat severe with accompanying swelling.  The pain in the right leg also extends up into the right posterior buttock.  No pain with sitting.  Really only has symptoms when she gets up from sitting.  Patient takes Neurontin for her symptoms..  Patient did have an MRI scan in 2021 which showed notably at L3-4 moderate spinal stenosis and moderately severe right lateral recess stenosis due to degenerative changes.  There was potential at that time for right-sided L4 nerve root compression.  I think that has likely progressed.              ROS: All systems reviewed are negative as they relate to the chief complaint within the history of present illness.  Patient denies fevers or chills.  Assessment & Plan: Visit Diagnoses:  1. Pain in right leg   2. Left knee pain, unspecified chronicity     Plan: Impression is low back pain with likely right sided foraminal stenosis which has progressed from MRI scan from 2021.  Plan refer to Dr. Alvester Morin for lumbar spine ESI on the right-hand side and if that does not give sustained relief then the repeat imaging indicated with referral to Dr. Christell Constant for surgical evaluation.  Regarding the knee the patient does have good range of motion in  both knees but significant effusion on the left-hand side.  Aspiration and injection in that left knee is performed today with about 40 cc of joint fluid aspirated.  Will see how she does with that intervention in the left knee.  She does not have much in terms of arthritis visible on plain radiographs in either knee.  If the injection does not help then we could consider other interventions but her biggest problem now is that right leg pain.  Follow-Up Instructions: No follow-ups on file.   Orders:  Orders Placed This Encounter  Procedures   XR HIP UNILAT W OR W/O PELVIS 2-3 VIEWS RIGHT   XR Lumbar Spine 2-3 Views   XR KNEE 3 VIEW LEFT   Ambulatory referral to Physical Medicine Rehab   No orders of the defined types were placed in this encounter.     Procedures: No procedures performed   Clinical Data: No additional findings.  Objective: Vital Signs: There were no vitals taken for this visit.  Physical Exam:  Constitutional: Patient appears well-developed HEENT:  Head: Normocephalic Eyes:EOM are normal Neck: Normal range of motion Cardiovascular: Normal rate Pulmonary/chest: Effort normal Neurologic: Patient is alert Skin: Skin is warm Psychiatric: Patient has normal mood and affect  Ortho Exam: Ortho exam demonstrates bilateral knee range  of motion 5 degrees to full forward flexion.  Collateral cruciate ligaments are stable.  No effusion in the right knee but moderate effusion in the left knee.  No groin pain with internal/external Tatian of the leg.  No nerve root tension signs.  Patient has 5 out of 5 ankle dorsiflexion plantarflexion quad hamstring strength.  Mild pain with forward lateral bending but no trochanteric tenderness is present.  No masses lymphadenopathy or skin changes noted in either knee region or in the back region.  Specialty Comments:  No specialty comments available.  Imaging: No results found.   PMFS History: Patient Active Problem List    Diagnosis Date Noted   Callus of toe 11/10/2023   Chronic pain of both lower extremities 11/10/2023   Vaccine refused by patient 11/10/2023   Biceps tendonitis, left 08/25/2022   Nontraumatic complete tear of left rotator cuff 08/25/2022   Synovitis of left shoulder 08/25/2022   Rotator cuff tear arthropathy of left shoulder 07/10/2022   Former smoker 06/28/2022   Acute pain of left shoulder 06/28/2022   Screening for hematuria or proteinuria 06/28/2022   Screening for heart disease 06/28/2022   Encounter for screening mammogram for malignant neoplasm of breast 06/28/2022   Needs flu shot 06/28/2022   Bunion of right foot 03/28/2021   Flat feet 03/28/2021   Bunion 03/28/2021   Radiculopathy, lumbar region 10/19/2020   Right cervical radiculopathy 10/19/2020   Muscle cramp 08/30/2020   Right leg paresthesias 08/08/2020   Phlebitis 07/07/2020   Right leg pain 07/07/2020   Screening for lung cancer 07/07/2020   Medicare annual wellness visit, subsequent 05/25/2020   Neuropathy 05/25/2020   Vaccine counseling 05/25/2020   Leg cramping 05/25/2020   Varicose veins of both lower extremities 05/25/2020   Spider veins 05/25/2020   Ringing in ear, bilateral 05/25/2020   Impaired fasting blood sugar 08/20/2019   Vitamin D deficiency 08/20/2019   S/P hysterectomy 08/20/2019   Osteoporosis without current pathological fracture 08/27/2017   Encounter for health maintenance examination in adult 01/08/2017   Estrogen deficiency 01/08/2017   PVD (peripheral vascular disease) (HCC) 01/08/2017   Chronic right shoulder pain 01/08/2017   Paresthesia of both lower extremities 05/22/2015   Essential hypertension 05/22/2015   Mixed dyslipidemia 05/22/2015   History of back surgery 05/22/2015   Advance directive discussed with patient 05/22/2015   Past Medical History:  Diagnosis Date   Bilateral leg pain    Dyslipidemia    Full dentures    H/O bone density study 04/2013   never   H/O  mammogram 2010   Hematuria    microscopic, several prior evaluations, no source or cause found   Hyperlipidemia    Hypertension    Impaired fasting blood sugar 2008   Influenza vaccine side effect    intolerance, declines   Pre-diabetes    Tobacco use    Wears glasses     Family History  Problem Relation Age of Onset   Alzheimer's disease Mother    Other Mother        died of UTI, dehydration   Other Father 39       failure to thrive, old age, fall and rib fracture   Diabetes Father    Diabetes Sister    Cancer Brother        brain, stomach   Colon cancer Neg Hx     Past Surgical History:  Procedure Laterality Date   ABDOMINAL HYSTERECTOMY  1980   total; due to  heavy bleeding   APPENDECTOMY  1980   KNEE SURGERY     right   SHOULDER ARTHROSCOPY WITH SUBACROMIAL DECOMPRESSION, ROTATOR CUFF REPAIR AND BICEP TENDON REPAIR Left 08/20/2022   Procedure: left shoulder arthroscopy, debridement, biceps tenodesis, mini open rotator cuff repair;  Surgeon: Cammy Copa, MD;  Location: [facility] OR;  Service: Orthopedics;  Laterality: Left;   SHOULDER SURGERY Right    rotator cuff   SPINE SURGERY     lumbar   TONSILLECTOMY     TUBAL LIGATION     Social History   Occupational History   Occupation: Receptionist - part time  Tobacco Use   Smoking status: Every Day    Current packs/day: 0.50    Average packs/day: 0.5 packs/day for 40.0 years (20.0 ttl pk-yrs)    Types: Cigarettes   Smokeless tobacco: Never   Tobacco comments:    Smokes off and on, "I can go months and not smoke"  Vaping Use   Vaping status: Never Used  Substance and Sexual Activity   Alcohol use: No   Drug use: No   Sexual activity: Not on file

## 2023-11-17 ENCOUNTER — Telehealth: Payer: Self-pay | Admitting: Medical

## 2023-11-17 NOTE — Telephone Encounter (Signed)
 Pt called and states she needs to go to a urologist in [place] or [place] because her daughter can't drive to [employer].

## 2023-11-21 ENCOUNTER — Encounter: Payer: Self-pay | Admitting: Podiatry

## 2023-11-21 ENCOUNTER — Ambulatory Visit: Admitting: Podiatry

## 2023-11-21 VITALS — Ht 64.0 in | Wt 164.0 lb

## 2023-11-21 DIAGNOSIS — L84 Corns and callosities: Secondary | ICD-10-CM | POA: Diagnosis not present

## 2023-11-21 DIAGNOSIS — I739 Peripheral vascular disease, unspecified: Secondary | ICD-10-CM | POA: Diagnosis not present

## 2023-11-21 DIAGNOSIS — M2042 Other hammer toe(s) (acquired), left foot: Secondary | ICD-10-CM | POA: Diagnosis not present

## 2023-11-21 DIAGNOSIS — M2041 Other hammer toe(s) (acquired), right foot: Secondary | ICD-10-CM | POA: Diagnosis not present

## 2023-11-21 NOTE — Progress Notes (Signed)
  Subjective:  Patient ID: Alyssa Carter, female    DOB: 05-Apr-1946,  MRN: [id number]  Chief Complaint  Patient presents with   Callouses    " I have a callous and it hurts like a toothache, and I will use a corn patches over the counter and some band aids and they still hurt"    78 y.o. female presents with the above complaint. History confirmed with patient. Patient does not have a history of T2DM.  She is here for painful callus present to the right fifth toe causing significant pain.  Objective:  Physical Exam: warm, good capillary refill, diminished pedal hair growth, pedal skin atrophic nail exam normal nails without lesions DP pulses palpable, PT faintly pulses palpable, and protective sensation threshold decreased.  Varicose veins present Left Foot:  Pain with palpation of nails due to elongation and dystrophic growth.  Adductovarus fifth toe Right Foot: Pain with palpation of nails due to elongation and dystrophic growth.  Adductovarus fifth toe with painful callus PIPJ  Assessment:   1. Hammer toes of both feet   2. Callus of toe   3. PVD (peripheral vascular disease) (HCC)      Plan:  Patient was evaluated and treated and all questions answered.  #Hyperkeratotic lesions/pre ulcerative calluses present right fifth toe All symptomatic hyperkeratoses x 1 separate lesions were safely debrided with a sterile #312 scalpel blade to patient's level of comfort without incident. We discussed preventative and palliative care of these lesions including supportive and accommodative shoegear, padding, prefabricated and custom molded accommodative orthoses, use of a pumice stone and lotions/creams daily. -Patient at risk due to neuropathy and decreased protective sensation, decreased circulatory status with varicose veins and diminished PT pulses -Associated with hammertoe deformities, pain x-rays if she requires further treatment  Return if symptoms worsen or fail to improve, for  Hammer toe, callus.         Bronwen Betters, DPM [facility] / [facility]

## 2023-11-21 NOTE — Patient Instructions (Signed)
 Look for urea 40% cream or ointment and apply to the thickened dry skin / calluses. This can be bought over the counter, at a pharmacy or online such as [employer].  Can also scrub the callus with white vinegar to keep the callus under control.  This will make it easier for you to file down ambulating with a pumice stone.  More silicone pads can be purchased from:  [email or web address]

## 2023-11-27 ENCOUNTER — Ambulatory Visit: Admitting: Physical Medicine and Rehabilitation

## 2023-11-27 ENCOUNTER — Other Ambulatory Visit: Payer: Self-pay

## 2023-11-27 ENCOUNTER — Telehealth: Payer: Self-pay | Admitting: Medical

## 2023-11-27 VITALS — BP 180/76 | HR 87

## 2023-11-27 DIAGNOSIS — M5416 Radiculopathy, lumbar region: Secondary | ICD-10-CM | POA: Diagnosis not present

## 2023-11-27 MED ORDER — LISINOPRIL 20 MG PO TABS: 20.0000 mg | ORAL_TABLET | Freq: Every day | ORAL | 3 refills | Status: DC

## 2023-11-27 MED ORDER — METHYLPREDNISOLONE ACETATE 40 MG/ML IJ SUSP
40.0000 mg | Freq: Once | INTRAMUSCULAR | Status: AC
  Administered 2023-11-27: 40 mg

## 2023-11-27 NOTE — Patient Instructions (Signed)

## 2023-11-27 NOTE — Telephone Encounter (Signed)
 sent

## 2023-11-27 NOTE — Progress Notes (Signed)
 Pain Scale   Average Pain 6        +Driver, -BT, -Dye Allergies.

## 2023-11-27 NOTE — Telephone Encounter (Signed)
 Fax from [facility]   Lisinopril 20 mg

## 2023-11-28 ENCOUNTER — Telehealth: Payer: Self-pay | Admitting: Medical

## 2023-11-28 MED ORDER — FENOFIBRATE 145 MG PO TABS: 145.0000 mg | ORAL_TABLET | Freq: Every day | ORAL | 3 refills | Status: AC

## 2023-11-28 NOTE — Telephone Encounter (Signed)
 refilled

## 2023-11-28 NOTE — Telephone Encounter (Signed)
 Fax from [facility]  Fenofibrate  Lisinopril

## 2023-12-04 ENCOUNTER — Other Ambulatory Visit: Payer: Self-pay | Admitting: Medical

## 2023-12-04 NOTE — Progress Notes (Signed)
 TAJI SATHER - 78 y.o. female MRN [id number]  Date of birth: 1946/06/09  Office Visit Note: Visit Date: 11/27/2023 PCP: Jac Canavan, PA-C Referred by: Jac Canavan, PA-C  Subjective: Chief Complaint  Patient presents with   Lower Back - Pain   HPI:  Alyssa Carter is a 78 y.o. female who comes in today at the request of Dr. Burnard Bunting for planned Right L4-5 Lumbar Transforaminal epidural steroid injection with fluoroscopic guidance.  The patient has failed conservative care including home exercise, medications, time and activity modification.  This injection will be diagnostic and hopefully therapeutic.  Please see requesting physician notes for further details and justification.   ROS Otherwise per HPI.  Assessment & Plan: Visit Diagnoses:    ICD-10-CM   1. Lumbar radiculopathy  M54.16 XR C-ARM NO REPORT    Epidural Steroid injection    methylPREDNISolone acetate (DEPO-MEDROL) injection 40 mg      Plan: No additional findings.   Meds & Orders:  Meds ordered this encounter  Medications   methylPREDNISolone acetate (DEPO-MEDROL) injection 40 mg    Orders Placed This Encounter  Procedures   XR C-ARM NO REPORT   Epidural Steroid injection    Follow-up: Return for visit to requesting provider as needed.   Procedures: No procedures performed  Lumbosacral Transforaminal Epidural Steroid Injection - Sub-Pedicular Approach with Fluoroscopic Guidance  Patient: Alyssa Carter      Date of Birth: Jan 29, 1946 MRN: [id number] PCP: Jac Canavan, PA-C      Visit Date: 11/27/2023   Universal Protocol:    Date/Time: 11/27/2023  Consent Given By: the patient  Position: PRONE  Additional Comments: Vital signs were monitored before and after the procedure. Patient was prepped and draped in the usual sterile fashion. The correct patient, procedure, and site was verified.   Injection Procedure Details:   Procedure diagnoses: Lumbar radiculopathy  [M54.16]    Meds Administered:  Meds ordered this encounter  Medications   methylPREDNISolone acetate (DEPO-MEDROL) injection 40 mg    Laterality: Right  Location/Site: L4  Needle:5.0 in., 22 ga.  Short bevel or Quincke spinal needle  Needle Placement: Transforaminal  Findings:    -Comments: Excellent flow of contrast along the nerve, nerve root and into the epidural space.  Procedure Details: After squaring off the end-plates to get a true AP view, the C-arm was positioned so that an oblique view of the foramen as noted above was visualized. The target area is just inferior to the "nose of the scotty dog" or sub pedicular. The soft tissues overlying this structure were infiltrated with 2-3 ml. of 1% Lidocaine without Epinephrine.  The spinal needle was inserted toward the target using a "trajectory" view along the fluoroscope beam.  Under AP and lateral visualization, the needle was advanced so it did not puncture dura and was located close the 6 O'Clock position of the pedical in AP tracterory. Biplanar projections were used to confirm position. Aspiration was confirmed to be negative for CSF and/or blood. A 1-2 ml. volume of Isovue-250 was injected and flow of contrast was noted at each level. Radiographs were obtained for documentation purposes.   After attaining the desired flow of contrast documented above, a 0.5 to 1.0 ml test dose of 0.25% Marcaine was injected into each respective transforaminal space.  The patient was observed for 90 seconds post injection.  After no sensory deficits were reported, and normal lower extremity motor function was noted,   the above  injectate was administered so that equal amounts of the injectate were placed at each foramen (level) into the transforaminal epidural space.   Additional Comments:  The patient tolerated the procedure well Dressing: 2 x 2 sterile gauze and Band-Aid    Post-procedure details: Patient was observed during the  procedure. Post-procedure instructions were reviewed.  Patient left the clinic in stable condition.    Clinical History: 08/31/20 EXAM: MRI of the lumbar spine without contrast   ORDERING CLINICIAN: Levert Feinstein MD, PhD CLINICAL HISTORY: 78 year old woman with right leg pain and paresthesias COMPARISON FILMS: None.gim   TECHNIQUE: MRI of the lumbar spine was obtained utilizing 4 mm sagittal slices from T11-12 down to the lower sacrum with T1, T2 and inversion recovery views. In addition 4 mm axial slices from L1-2 down to L5-S1 level were included with T1 and T2 weighted views. CONTRAST: None IMAGING SITE: [facility], [address] [place], [place], [place]   FINDINGS: On sagittal images, the spine is imaged from T11 to the sacrum.   The conus medullaris and cauda equine appear normal.  Mild scoliosis convex to the left.  There is 2 mm anterolisthesis of L4 upon L5 associated with moderately severe loss of disc height.  There is severe loss of disc height at L5-S1.  Endplate edema is noted at L3-L4.  There are chronic Schmorl's nodes in the superior endplates of L1-L2.  A hemangioma is noted within the L5 vertebral body.   The discs and interspaces were further evaluated on axial views from T12 to S1 as follows:   T12-L1: There is a small focal left paramedian disc protrusion.  No nerve root compression or spinal stenosis.   L1-L2: There is broad disc protrusion, mild facet hypertrophy and mild ligamenta flava hypertrophy.  This does not lead to spinal stenosis, foraminal narrowing or nerve root compression.   L2-L3: There is mild disc protrusion, facet hypertrophy and ligamenta flava hypertrophy causing mild spinal stenosis.  The neuroforamina are widely patent.  There is moderate right and mild left lateral recess stenosis but there does not appear to be any nerve root compression   L3-L4: There is moderate spinal stenosis due to disc protrusion, facet hypertrophy and ligamenta flava  hypertrophy.  There is mild to moderate right foraminal narrowing and moderately severe right lateral recess stenosis and moderate left lateral recess stenosis.  There is potential for right L4 nerve root compression.   L4-L5: There is mild anterolisthesis of L4 upon L5 associated with disc protrusion, endplate spurring, facet hypertrophy.  There has been a prior right hemilaminectomy.  Neuroforamina are not significantly narrowed.  There is mild right and moderate left foraminal narrowing but no nerve root compression.   L5-S1: There is endplate spurring and mild left facet hypertrophy.  There is mild foraminal narrowing and no lateral recess stenosis.  There is no spinal stenosis or nerve root compression.     IMPRESSION: This MRI of the lumbar spine without contrast shows multilevel degenerative changes, most advanced at L3-L4: 1.   At T12-L1 and L1-L2, there are mild degenerative changes but no spinal stenosis or nerve root compression. 2.   At L2-L3, there is mild spinal stenosis but no nerve root compression. 3.   At L3-L4, there is moderate spinal stenosis and moderately severe right lateral recess stenosis due to degenerative changes.  There is potential for right L4 nerve root compression. 4.   At L4-L5, there is mild anterolisthesis and other degenerative changes.  There has been prior right  hemilaminectomy.  No nerve root compression or spinal stenosis. 5.   At L5-S1, there are degenerative changes but no nerve root compression or spinal stenosis.     INTERPRETING PHYSICIAN:  Richard A. Epimenio Foot, MD, PhD, [place]     Objective:  VS:  HT:    WT:   BMI:     BP:(!) 180/76  HR:87bpm  TEMP: ( )  RESP:  Physical Exam Vitals and nursing note reviewed.  Constitutional:      General: She is not in acute distress.    Appearance: Normal appearance. She is not ill-appearing.  HENT:     Head: Normocephalic and atraumatic.     Right Ear: External ear normal.     Left Ear: External ear  normal.  Eyes:     Extraocular Movements: Extraocular movements intact.  Cardiovascular:     Rate and Rhythm: Normal rate.     Pulses: Normal pulses.  Pulmonary:     Effort: Pulmonary effort is normal. No respiratory distress.  Abdominal:     General: There is no distension.     Palpations: Abdomen is soft.  Musculoskeletal:        General: Tenderness present.     Cervical back: Neck supple.     Right lower leg: No edema.     Left lower leg: No edema.     Comments: Patient has good distal strength with no pain over the greater trochanters.  No clonus or focal weakness.  Skin:    Findings: No erythema, lesion or rash.  Neurological:     General: No focal deficit present.     Mental Status: She is alert and oriented to person, place, and time.     Sensory: No sensory deficit.     Motor: No weakness or abnormal muscle tone.     Coordination: Coordination normal.  Psychiatric:        Mood and Affect: Mood normal.        Behavior: Behavior normal.      Imaging: No results found.

## 2023-12-04 NOTE — Procedures (Signed)
 Lumbosacral Transforaminal Epidural Steroid Injection - Sub-Pedicular Approach with Fluoroscopic Guidance  Patient: Alyssa Carter      Date of Birth: Nov 11, 1945 MRN: [id number] PCP: Jac Canavan, PA-C      Visit Date: 11/27/2023   Universal Protocol:    Date/Time: 11/27/2023  Consent Given By: the patient  Position: PRONE  Additional Comments: Vital signs were monitored before and after the procedure. Patient was prepped and draped in the usual sterile fashion. The correct patient, procedure, and site was verified.   Injection Procedure Details:   Procedure diagnoses: Lumbar radiculopathy [M54.16]    Meds Administered:  Meds ordered this encounter  Medications   methylPREDNISolone acetate (DEPO-MEDROL) injection 40 mg    Laterality: Right  Location/Site: L4  Needle:5.0 in., 22 ga.  Short bevel or Quincke spinal needle  Needle Placement: Transforaminal  Findings:    -Comments: Excellent flow of contrast along the nerve, nerve root and into the epidural space.  Procedure Details: After squaring off the end-plates to get a true AP view, the C-arm was positioned so that an oblique view of the foramen as noted above was visualized. The target area is just inferior to the "nose of the scotty dog" or sub pedicular. The soft tissues overlying this structure were infiltrated with 2-3 ml. of 1% Lidocaine without Epinephrine.  The spinal needle was inserted toward the target using a "trajectory" view along the fluoroscope beam.  Under AP and lateral visualization, the needle was advanced so it did not puncture dura and was located close the 6 O'Clock position of the pedical in AP tracterory. Biplanar projections were used to confirm position. Aspiration was confirmed to be negative for CSF and/or blood. A 1-2 ml. volume of Isovue-250 was injected and flow of contrast was noted at each level. Radiographs were obtained for documentation purposes.   After attaining the  desired flow of contrast documented above, a 0.5 to 1.0 ml test dose of 0.25% Marcaine was injected into each respective transforaminal space.  The patient was observed for 90 seconds post injection.  After no sensory deficits were reported, and normal lower extremity motor function was noted,   the above injectate was administered so that equal amounts of the injectate were placed at each foramen (level) into the transforaminal epidural space.   Additional Comments:  The patient tolerated the procedure well Dressing: 2 x 2 sterile gauze and Band-Aid    Post-procedure details: Patient was observed during the procedure. Post-procedure instructions were reviewed.  Patient left the clinic in stable condition.

## 2023-12-05 ENCOUNTER — Other Ambulatory Visit: Payer: Self-pay | Admitting: Medical

## 2023-12-05 NOTE — Telephone Encounter (Signed)
 Is this okay to refill?

## 2023-12-25 ENCOUNTER — Other Ambulatory Visit: Payer: Self-pay | Admitting: Medical

## 2023-12-25 NOTE — Telephone Encounter (Signed)
 Can refilled lisinopril only at visit for a year.

## 2024-01-09 DIAGNOSIS — R3121 Asymptomatic microscopic hematuria: Secondary | ICD-10-CM | POA: Diagnosis not present

## 2024-01-09 DIAGNOSIS — R310 Gross hematuria: Secondary | ICD-10-CM | POA: Diagnosis not present

## 2024-01-21 ENCOUNTER — Other Ambulatory Visit (HOSPITAL_COMMUNITY): Payer: Self-pay

## 2024-02-20 DIAGNOSIS — K5730 Diverticulosis of large intestine without perforation or abscess without bleeding: Secondary | ICD-10-CM | POA: Diagnosis not present

## 2024-02-20 DIAGNOSIS — N281 Cyst of kidney, acquired: Secondary | ICD-10-CM | POA: Diagnosis not present

## 2024-02-20 DIAGNOSIS — R3121 Asymptomatic microscopic hematuria: Secondary | ICD-10-CM | POA: Diagnosis not present

## 2024-02-20 DIAGNOSIS — R310 Gross hematuria: Secondary | ICD-10-CM | POA: Diagnosis not present

## 2024-04-29 ENCOUNTER — Other Ambulatory Visit: Payer: Self-pay | Admitting: Medical

## 2024-07-02 DIAGNOSIS — G629 Polyneuropathy, unspecified: Secondary | ICD-10-CM | POA: Diagnosis not present

## 2024-07-02 DIAGNOSIS — M545 Low back pain, unspecified: Secondary | ICD-10-CM | POA: Diagnosis not present

## 2024-07-02 DIAGNOSIS — E785 Hyperlipidemia, unspecified: Secondary | ICD-10-CM | POA: Diagnosis not present

## 2024-07-02 DIAGNOSIS — I10 Essential (primary) hypertension: Secondary | ICD-10-CM | POA: Diagnosis not present

## 2024-07-02 DIAGNOSIS — M1990 Unspecified osteoarthritis, unspecified site: Secondary | ICD-10-CM | POA: Diagnosis not present

## 2024-07-02 DIAGNOSIS — I2510 Atherosclerotic heart disease of native coronary artery without angina pectoris: Secondary | ICD-10-CM | POA: Diagnosis not present

## 2024-07-02 DIAGNOSIS — E669 Obesity, unspecified: Secondary | ICD-10-CM | POA: Diagnosis not present

## 2024-07-02 DIAGNOSIS — R7303 Prediabetes: Secondary | ICD-10-CM | POA: Diagnosis not present

## 2024-07-02 DIAGNOSIS — M810 Age-related osteoporosis without current pathological fracture: Secondary | ICD-10-CM | POA: Diagnosis not present

## 2024-07-05 ENCOUNTER — Ambulatory Visit: Payer: Self-pay

## 2024-07-05 NOTE — Telephone Encounter (Signed)
 FYI Only or Action Required?: FYI only for provider.  Patient was last seen in primary care on 11/10/2023 by Bulah Alm RAMAN, PA-C.  Called Nurse Triage reporting Hypertension.  Symptoms began several days ago.  Interventions attempted: Prescription medications: lisinopril .  Symptoms are: unchanged.  Triage Disposition: See Physician Within 24 Hours  Patient/caregiver understands and will follow disposition?: Yes     Copied from CRM [id number]. Topic: Clinical - Red Word Triage >> Jul 05, 2024  4:30 PM Amy B wrote: Red Word that prompted transfer to Nurse Triage: High blood pressure 185/78 left arm, 175/70 right arm Reason for Disposition  Systolic BP >= 180 OR Diastolic >= 110  Answer Assessment - Initial Assessment Questions [employer] wellness nurse came out Friday and checked BP then and stated it was high. She's been checking it at home and it's been elevated. She had a nurse at work check it today and it was high with a wrist cuff.    1. BLOOD PRESSURE: What is your blood pressure? Did you take at least two measurements 5 minutes apart?     185/78 2. ONSET: When did you take your blood pressure?     Friday  3. HOW: How did you take your blood pressure? (e.g., automatic home BP monitor, visiting nurse)     Automatic, wrist  4. HISTORY: Do you have a history of high blood pressure?     High blood pressure 5. MEDICINES: Are you taking any medicines for blood pressure? Have you missed any doses recently?     Lisinopril - no  6. OTHER SYMPTOMS: Do you have any symptoms? (e.g., blurred vision, chest pain, difficulty breathing, headache, weakness)     No symptoms  Protocols used: Blood Pressure - High-A-AH

## 2024-07-06 ENCOUNTER — Ambulatory Visit (INDEPENDENT_AMBULATORY_CARE_PROVIDER_SITE_OTHER): Admitting: Medical

## 2024-07-06 VITALS — BP 138/68 | HR 85 | Wt 167.8 lb

## 2024-07-06 DIAGNOSIS — I739 Peripheral vascular disease, unspecified: Secondary | ICD-10-CM | POA: Diagnosis not present

## 2024-07-06 DIAGNOSIS — I10 Essential (primary) hypertension: Secondary | ICD-10-CM | POA: Diagnosis not present

## 2024-07-06 DIAGNOSIS — R3129 Other microscopic hematuria: Secondary | ICD-10-CM | POA: Insufficient documentation

## 2024-07-06 DIAGNOSIS — F17200 Nicotine dependence, unspecified, uncomplicated: Secondary | ICD-10-CM | POA: Diagnosis not present

## 2024-07-06 DIAGNOSIS — H938X3 Other specified disorders of ear, bilateral: Secondary | ICD-10-CM | POA: Diagnosis not present

## 2024-07-06 DIAGNOSIS — H68003 Unspecified Eustachian salpingitis, bilateral: Secondary | ICD-10-CM | POA: Diagnosis not present

## 2024-07-06 DIAGNOSIS — E782 Mixed hyperlipidemia: Secondary | ICD-10-CM | POA: Diagnosis not present

## 2024-07-06 DIAGNOSIS — E790 Hyperuricemia without signs of inflammatory arthritis and tophaceous disease: Secondary | ICD-10-CM | POA: Insufficient documentation

## 2024-07-06 MED ORDER — AMLODIPINE-OLMESARTAN 5-20 MG PO TABS: 1.0000 | ORAL_TABLET | Freq: Every day | ORAL | 2 refills | Status: DC

## 2024-07-06 NOTE — Progress Notes (Signed)
 subjective:  Alyssa Carter is a 78 y.o. female who presents for Chief Complaint  Patient presents with   Acute Visit    High blood pressure. [employer] nurse came out Friday and bp was high, had Ham friday     Alyssa Carter is a 78 year old female with hypertension who presents with elevated blood pressure readings.  She has experienced elevated blood pressure readings recently, with a home health nurse recording a reading of 170/73 and another reading at work of 185/73. She attributes the spike to consuming a processed ham sandwich. Her blood pressure normalized to 134/60 after returning home from work. She is currently taking lisinopril  20 mg daily for hypertension.  Her medication regimen also includes Crestor  40 mg, fenofibrate  145 mg, and over-the-counter fish oil for cholesterol management, along with a multivitamin. She recalls previously being on a combination pill that included a diuretic, which was stopped after she experienced a swollen foot.  She reports no chest pain or difficulty breathing. Her last EKG was over two years ago, and she has not had significant heart testing recently. She smokes occasionally but less than before.  She mentions a sensation of her left ear feeling 'stopped up' occasionally, which she associates with air conditioning at work. No runny nose, sneezing, itchy eyes, itchy ears, or cough. She has tried ibuprofen  and Flonase nasal spray without relief.  In terms of social history, she lives in [place] and has been trying to quit smoking. She has lost a few pounds since her last visit and weighs around 167-168 pounds.   No other aggravating or relieving factors.    No other c/o.  Past Medical History:  Diagnosis Date   Bilateral leg pain    Dyslipidemia    Full dentures    H/O bone density study 04/2013   never   H/O mammogram 2010   Hematuria    microscopic, several prior evaluations, no source or cause found   Hyperlipidemia     Hypertension    Impaired fasting blood sugar 2008   Influenza vaccine side effect    intolerance, declines   Pre-diabetes    Tobacco use    Wears glasses    Current Outpatient Medications on File Prior to Visit  Medication Sig Dispense Refill   fenofibrate  (TRICOR ) 145 MG tablet Take 1 tablet (145 mg total) by mouth daily. 90 tablet 3   gabapentin  (NEURONTIN ) 300 MG capsule TAKE 2 CAPSULES AT BEDTIME 180 capsule 3   lisinopril  (ZESTRIL ) 20 MG tablet Take 1 tablet (20 mg total) by mouth daily. 90 tablet 3   Multiple Vitamin (MULTIVITAMIN WITH MINERALS) TABS tablet Take 1 tablet by mouth daily.     Omega-3 Fatty Acids (FISH OIL PO) Take 2 capsules by mouth daily.     rosuvastatin  (CRESTOR ) 40 MG tablet Take 1 tablet (40 mg total) by mouth daily. 90 tablet 3   No current facility-administered medications on file prior to visit.     The following portions of the patient's history were reviewed and updated as appropriate: allergies, current medications, past family history, past medical history, past social history, past surgical history and problem list.  ROS Otherwise as in subjective above  Objective: BP 138/68   Pulse 85   Wt 167 lb 12.8 oz (76.1 kg)   SpO2 98%   BMI 28.80 kg/m   General appearance: alert, no distress, well developed, well nourished HEENT: normocephalic, sclerae anicteric, conjunctiva pink and moist, TMs pearly, nares patent, no  discharge or erythema, pharynx normal Oral cavity: MMM, no lesions Neck: supple, no lymphadenopathy, no thyromegaly, no masses, no bruits Heart: RRR, normal S1, S2, no murmurs Lungs: CTA bilaterally, no wheezes, rhonchi, or rales Pulses: 2+ radial pulses, 2+ pedal pulses, normal cap refill Ext: no edema   Assessment: Encounter Diagnoses  Name Primary?   Essential hypertension Yes   Mixed dyslipidemia    PVD (peripheral vascular disease)    Smoker    Salpingitis of both eustachian tubes    Ear pressure, bilateral     Microscopic hematuria    Elevated uric acid in blood      Plan: Essential Hypertension Blood pressure elevated. Lisinopril  not optimal - Switch to amlodipine /olmesartan 5/20 mg daily. - Refer to cardiology for further evaluation and potential additional cardiac testing given age, history of hypertension, history of tobacco use and hyperlipidemia.  No current symptoms of concern. - Advise dietary modifications to reduce salt intake, including avoiding processed and salty foods. - Encourage regular exercise and weight management. - Monitor blood pressure at home and follow up in one month if cardiology appointment is not within 30 days. - Of note, she was on lisinopril  20 mg most recently, was on lisinopril  HCT last year but after uric acid elevated and low blood pressure we stopped the HCTZ component  Hyperlipidemia Managed with Crestor , fenofibrate , and fish oil. - Continue Crestor  40 mg daily. - Continue fenofibrate  145 mg daily. - Continue over-the-counter fish oil.  Eustachian Tube Dysfunction, Left Ear Intermittent sensation of ear being stopped up. No infection or wax buildup. - Recommend trial of over-the-counter allergy medication such as Zyrtec or Allegra if Flonase nasal spray is ineffective.  Microscopic hematuria-evaluated by urology within the past year.  We will have her sign to get the records  Tobacco use-I recommend smoking cessation  Elevated uric acid-continue to monitor periodically with labs  Alyssa Carter was seen today for acute visit.  Diagnoses and all orders for this visit:  Essential hypertension -     Ambulatory referral to Cardiology  Mixed dyslipidemia -     Ambulatory referral to Cardiology  PVD (peripheral vascular disease) -     Ambulatory referral to Cardiology  Smoker -     Ambulatory referral to Cardiology  Salpingitis of both eustachian tubes  Ear pressure, bilateral  Microscopic hematuria  Elevated uric acid in blood  Other  orders -     amLODipine -olmesartan (AZOR) 5-20 MG tablet; Take 1 tablet by mouth daily.    Follow up: f/u 7mo here or with cardiology

## 2024-07-12 ENCOUNTER — Encounter: Payer: Self-pay | Admitting: Radiology

## 2024-08-09 ENCOUNTER — Encounter: Payer: Self-pay | Admitting: Medical

## 2024-09-17 ENCOUNTER — Encounter: Payer: Self-pay | Admitting: Medical

## 2024-09-17 ENCOUNTER — Ambulatory Visit: Admitting: Medical

## 2024-09-17 VITALS — BP 122/70 | HR 79 | Ht 62.5 in | Wt 169.2 lb

## 2024-09-17 DIAGNOSIS — E559 Vitamin D deficiency, unspecified: Secondary | ICD-10-CM | POA: Diagnosis not present

## 2024-09-17 DIAGNOSIS — R918 Other nonspecific abnormal finding of lung field: Secondary | ICD-10-CM

## 2024-09-17 DIAGNOSIS — Z0000 Encounter for general adult medical examination without abnormal findings: Secondary | ICD-10-CM | POA: Diagnosis not present

## 2024-09-17 DIAGNOSIS — Z90710 Acquired absence of both cervix and uterus: Secondary | ICD-10-CM | POA: Diagnosis not present

## 2024-09-17 DIAGNOSIS — I8393 Asymptomatic varicose veins of bilateral lower extremities: Secondary | ICD-10-CM | POA: Diagnosis not present

## 2024-09-17 DIAGNOSIS — Z2820 Immunization not carried out because of patient decision for unspecified reason: Secondary | ICD-10-CM

## 2024-09-17 DIAGNOSIS — I739 Peripheral vascular disease, unspecified: Secondary | ICD-10-CM

## 2024-09-17 DIAGNOSIS — Z79899 Other long term (current) drug therapy: Secondary | ICD-10-CM | POA: Diagnosis not present

## 2024-09-17 DIAGNOSIS — Z136 Encounter for screening for cardiovascular disorders: Secondary | ICD-10-CM

## 2024-09-17 DIAGNOSIS — R7301 Impaired fasting glucose: Secondary | ICD-10-CM | POA: Diagnosis not present

## 2024-09-17 DIAGNOSIS — F17200 Nicotine dependence, unspecified, uncomplicated: Secondary | ICD-10-CM

## 2024-09-17 DIAGNOSIS — I10 Essential (primary) hypertension: Secondary | ICD-10-CM

## 2024-09-17 DIAGNOSIS — Z122 Encounter for screening for malignant neoplasm of respiratory organs: Secondary | ICD-10-CM

## 2024-09-17 DIAGNOSIS — E782 Mixed hyperlipidemia: Secondary | ICD-10-CM

## 2024-09-17 NOTE — Progress Notes (Addendum)
 "  Name: Alyssa Carter   Date of Visit: 09/17/2024   Date of last visit with me: 07/06/2024   CHIEF COMPLAINT:  Chief Complaint  Patient presents with   Hypertension    Med check, still smokes some, declines vaccine and cancer screens       HPI:  Discussed the use of AI scribe software for clinical note transcription with the patient, who gave verbal consent to proceed.  History of Present Illness  Patient Care Team: Milda Lindvall, Alm GORMAN, PA-C as PCP - General (Family Medicine) Addie Cordella Hamilton, MD as Consulting Physician (Orthopedic Surgery) Shane Steffan BROCKS, MD as Consulting Physician (Urology)   Alyssa Carter is a 79 year old female who presents for a med management visit.  Her current medications include Crestor  40 mg daily and fenofibrate  145 mg daily for hyperlipidemia, amlodipine  and olmesartan  5/20 mg daily for hypertension, and gabapentin  600 mg at night. She also takes two fish oil supplements daily and a multivitamin. She has allergies to aspirin  and hydrochlorothiazide .  She has a history of prediabetes with stable but borderline diabetes markers. Her last blood work in March of the previous year showed good blood counts, liver, and kidney function, with cholesterol not quite at goal.  She smokes occasionally, with increased smoking on days when she feels nervous. She works at [employer], answering phones four days a week, which she finds beneficial for staying active. She lives across the street from her workplace and avoids driving at night due to glare issues with her glasses.  She recently  saw urology for hematuria, and a CT scan of her abdomen in June did not reveal any tumors. Reassured, and no planned follow up  She has declined vaccines in the past and is currently due for pneumonia, shingles, and tetanus vaccines. She also declined a chest CT for lung cancer screening and colon cancer screening.  No new moles or skin  changes or breathing problems.  She has a history of osteoporosis diagnosed in 2024 and is due for a bone density screening this year. She is not currently on any osteoporosis medications. She has prior denied medication.  She consumes a lot of milk for calcium  intake.   ROS as in subjective  Past Medical History:  Diagnosis Date   Bilateral leg pain    Dyslipidemia    Full dentures    H/O bone density study 04/2013   never   H/O mammogram 2010   Hematuria    microscopic, several prior evaluations, no source or cause found   Hyperlipidemia    Hypertension    Impaired fasting blood sugar 2008   Influenza vaccine side effect    intolerance, declines   Pre-diabetes    Tobacco use    Wears glasses     Medications Ordered Prior to Encounter[1]    OBJECTIVE:    BP 122/70   Pulse 79   Ht 5' 2.5 (1.588 m)   Wt 169 lb 3.2 oz (76.7 kg)   SpO2 100%   BMI 30.45 kg/m   BP Readings from Last 3 Encounters:  09/17/24 122/70  07/06/24 138/68  11/27/23 (!) 180/76    Wt Readings from Last 3 Encounters:  09/17/24 169 lb 3.2 oz (76.7 kg)  07/06/24 167 lb 12.8 oz (76.1 kg)  11/21/23 164 lb (74.4 kg)    General appearence: alert, no distress, WD/WN, white female Skin: scattered macules, no particular worrisome lesions HEENT: normocephalic, sclerae anicteric, TMs  pearly, nares patent, no discharge or erythema, pharynx normal Oral cavity: dentures present, MMM, no lesions Neck: supple, no lymphadenopathy, no thyromegaly, no masses Heart: RRR, normal S1, S2, no murmurs Lungs: decreased lung sounds in general, no wheezes, rhonchi, or rales Abdomen: +bs, soft, non tender, non distended, no masses, no hepatomegaly, no splenomegaly, no bruits Pulses: 2+ symmetric, upper and lower extremities, normal cap refill Breast/gyn - declined Ext: no edema Pulses 1+ pedal, 2+ UE, normal cap refill Neuro: cn2-12 intact , nonfocal Psych: pleasant, good eye contact, answers questions  appropriately    ASSESSMENT/PLAN:   Encounter Diagnoses  Name Primary?   Medication management Yes   Essential hypertension    Vitamin D  deficiency    Varicose veins of both lower extremities, unspecified whether complicated    Vaccine refused by patient    Smoker    Screening for lung cancer    S/P hysterectomy    PVD (peripheral vascular disease)    Mixed dyslipidemia    Impaired fasting blood sugar    Screening for heart disease    Abnormal lung field    Encounter for health maintenance examination in adult     Routine visit with normal blood pressure. Declined cancer screenings and vaccines. No new skin lesions or respiratory issues. - Ordered blood work.  Osteoporosis Prefers non-aggressive treatment. Discussed fall prevention, exercise, and vitamin D . - Encouraged weight-bearing exercise 2-3 times a week. - Recommended vitamin D  supplementation. - Discussed fall prevention strategies. -last bone density test abnormal 12/2022  Mixed dyslipidemia Managed with Crestor  and fenofibrate . Previous cholesterol levels not at goal. - Ordered blood work to assess current cholesterol levels.  Impaired fasting glucose Prediabetes with stable but borderline markers. - Ordered blood work to monitor glucose levels.  Nicotine dependence Continues to smoke, less than previously. Smoking increases with stress.  Long history of tobacco use, decreased lung sounds -PFT performed, normal today -advised CT chest.  She declines -advised completely stopping tobacco  Screen for heart disease, hx/o PVD, smoker -recent referral to cardiology placed -continue plan to see cardiology -declines CT coronary calcium  test, carotid US , AAA screen  Recent hematuria evaluated by urology, no worrisome findings on scan    Owen was seen today for hypertension.  Diagnoses and all orders for this visit:  Medication management  Essential hypertension -     CBC -     Comprehensive  metabolic panel with GFR -     Lipid panel -     TSH  Vitamin D  deficiency -     VITAMIN D  25 Hydroxy (Vit-D Deficiency, Fractures)  Varicose veins of both lower extremities, unspecified whether complicated  Vaccine refused by patient  Smoker  Screening for lung cancer  S/P hysterectomy  PVD (peripheral vascular disease) -     Lipid panel  Mixed dyslipidemia  Impaired fasting blood sugar -     Hemoglobin A1c  Screening for heart disease  Abnormal lung field  Encounter for health maintenance examination in adult Comments: error   F/u pending labs  [facility]          [1]  Current Outpatient Medications on File Prior to Visit  Medication Sig Dispense Refill   amLODipine -olmesartan  (AZOR ) 5-20 MG tablet Take 1 tablet by mouth daily. 30 tablet 2   fenofibrate  (TRICOR ) 145 MG tablet Take 1 tablet (145 mg total) by mouth daily. 90 tablet 3   gabapentin  (NEURONTIN ) 300 MG capsule TAKE 2 CAPSULES AT BEDTIME 180  capsule 3   Multiple Vitamin (MULTIVITAMIN WITH MINERALS) TABS tablet Take 1 tablet by mouth daily.     Omega-3 Fatty Acids (FISH OIL PO) Take 2 capsules by mouth daily.     rosuvastatin  (CRESTOR ) 40 MG tablet Take 1 tablet (40 mg total) by mouth daily. 90 tablet 3   No current facility-administered medications on file prior to visit.   "

## 2024-09-17 NOTE — Patient Instructions (Signed)
" °  Adult Wellness Visit Routine visit with normal blood pressure. Declined cancer screenings and vaccines. No new skin lesions or respiratory issues. - Ordered blood work.   Osteoporosis Prefers non-aggressive treatment. Discussed fall prevention, exercise, and vitamin D . - Encouraged weight-bearing exercise 2-3 times a week. - Recommended vitamin D  supplementation. - Discussed fall prevention strategies. -last bone density test abnormal 12/2022   Mixed dyslipidemia Managed with Crestor  and fenofibrate . Previous cholesterol levels not at goal. - Ordered blood work to assess current cholesterol levels.   Impaired fasting glucose Prediabetes with stable but borderline markers. - Ordered blood work to monitor glucose levels.   Nicotine dependence Continues to smoke, less than previously. Smoking increases with stress.   Long history of tobacco use, decreased lung sounds -PFT performed, normal today -advised CT chest.  She declines -advised completely stopping tobacco   Screen for heart disease, hx/o PVD, smoker -recent referral to cardiology placed -continue plan to see cardiology -declines CT coronary calcium  test, carotid US , AAA screen   Recent hematuria evaluated by urology, no worrisome findings on scan "

## 2024-09-17 NOTE — Addendum Note (Signed)
 Addended by: BULAH ALM RAMAN on: 09/17/2024 12:06 PM   Modules accepted: Level of Service

## 2024-09-18 LAB — CBC
Hematocrit: 39.1 % (ref 34.0–46.6)
Hemoglobin: 13.4 g/dL (ref 11.1–15.9)
MCH: 32.4 pg (ref 26.6–33.0)
MCHC: 34.3 g/dL (ref 31.5–35.7)
MCV: 94 fL (ref 79–97)
Platelets: 296 x10E3/uL (ref 150–450)
RBC: 4.14 x10E6/uL (ref 3.77–5.28)
RDW: 12.3 % (ref 11.7–15.4)
WBC: 4.6 x10E3/uL (ref 3.4–10.8)

## 2024-09-18 LAB — COMPREHENSIVE METABOLIC PANEL WITH GFR
ALT: 32 IU/L (ref 0–32)
AST: 25 IU/L (ref 0–40)
Albumin: 4.7 g/dL (ref 3.8–4.8)
Alkaline Phosphatase: 57 IU/L (ref 49–135)
BUN/Creatinine Ratio: 16 (ref 12–28)
BUN: 17 mg/dL (ref 8–27)
Bilirubin Total: 0.4 mg/dL (ref 0.0–1.2)
CO2: 23 mmol/L (ref 20–29)
Calcium: 10.1 mg/dL (ref 8.7–10.3)
Chloride: 104 mmol/L (ref 96–106)
Creatinine, Ser: 1.05 mg/dL — ABNORMAL HIGH (ref 0.57–1.00)
Globulin, Total: 2.2 g/dL (ref 1.5–4.5)
Glucose: 95 mg/dL (ref 70–99)
Potassium: 5 mmol/L (ref 3.5–5.2)
Sodium: 142 mmol/L (ref 134–144)
Total Protein: 6.9 g/dL (ref 6.0–8.5)
eGFR: 55 mL/min/1.73 — ABNORMAL LOW

## 2024-09-18 LAB — HEMOGLOBIN A1C
Est. average glucose Bld gHb Est-mCnc: 126 mg/dL
Hgb A1c MFr Bld: 6 % — ABNORMAL HIGH (ref 4.8–5.6)

## 2024-09-18 LAB — TSH: TSH: 1.15 u[IU]/mL (ref 0.450–4.500)

## 2024-09-18 LAB — LIPID PANEL
Chol/HDL Ratio: 2.9 ratio (ref 0.0–4.4)
Cholesterol, Total: 102 mg/dL (ref 100–199)
HDL: 35 mg/dL — ABNORMAL LOW
LDL Chol Calc (NIH): 39 mg/dL (ref 0–99)
Triglycerides: 167 mg/dL — ABNORMAL HIGH (ref 0–149)
VLDL Cholesterol Cal: 28 mg/dL (ref 5–40)

## 2024-09-18 LAB — VITAMIN D 25 HYDROXY (VIT D DEFICIENCY, FRACTURES): Vit D, 25-Hydroxy: 42.6 ng/mL (ref 30.0–100.0)

## 2024-09-20 ENCOUNTER — Other Ambulatory Visit: Payer: Self-pay | Admitting: Medical

## 2024-09-20 ENCOUNTER — Ambulatory Visit: Payer: Self-pay | Admitting: Medical

## 2024-09-20 MED ORDER — AMLODIPINE-OLMESARTAN 5-20 MG PO TABS: 1.0000 | ORAL_TABLET | Freq: Every day | ORAL | 2 refills | Status: DC

## 2024-09-20 NOTE — Progress Notes (Signed)
 Results through MyChart

## 2024-09-30 ENCOUNTER — Telehealth: Payer: Self-pay | Admitting: Medical

## 2024-09-30 MED ORDER — AMLODIPINE-OLMESARTAN 5-20 MG PO TABS: 1.0000 | ORAL_TABLET | Freq: Every day | ORAL | 2 refills | Status: DC

## 2024-09-30 NOTE — Telephone Encounter (Signed)
 [facility] fax  amlodipine 

## 2024-09-30 NOTE — Telephone Encounter (Signed)
 refilled

## 2024-10-01 MED ORDER — AMLODIPINE-OLMESARTAN 5-20 MG PO TABS: 1.0000 | ORAL_TABLET | Freq: Every day | ORAL | 2 refills | Status: AC

## 2024-10-01 MED ORDER — AMLODIPINE-OLMESARTAN 5-20 MG PO TABS: 1.0000 | ORAL_TABLET | Freq: Every day | ORAL | 0 refills | Status: AC

## 2024-10-01 MED ORDER — AMLODIPINE-OLMESARTAN 5-20 MG PO TABS: 1.0000 | ORAL_TABLET | Freq: Every day | ORAL | 2 refills | Status: DC

## 2024-10-01 NOTE — Telephone Encounter (Addendum)
 I have [facility] [place] and then sent a rx to [facility] starting in March for pt  Copied from CRM #[id number]. Topic: Clinical - Medication Question >> Oct 01, 2024 12:29 PM Drema MATSU wrote: Reason for CRM: [facility] stated that [facility]  amLODipine -olmesartan  (AZOR ) 5-20 MG tablet until March and then [facility] will fill it  from there. [facility] called [facility] and they dont have anything. She said that she has 5 pills left. [facility] is also saying that they can't do anything because there is [facility] at [facility]. Please call pt.

## 2024-10-08 NOTE — Addendum Note (Signed)
 Addended by: VICCI HUSBAND A on: 10/08/2024 11:12 AM   Modules accepted: Orders

## 2024-10-12 ENCOUNTER — Ambulatory Visit: Payer: Medicare HMO

## 2024-11-12 ENCOUNTER — Ambulatory Visit: Payer: Self-pay | Admitting: Medical
